# Patient Record
Sex: Female | Born: 1980 | Race: Black or African American | Hispanic: No | Marital: Single | Smoking: Former smoker
Health system: Southern US, Community
[De-identification: ages and names within clinical notes are randomized; demographics above are authoritative.]

## PROBLEM LIST (undated history)

## (undated) DIAGNOSIS — M329 Systemic lupus erythematosus, unspecified: Secondary | ICD-10-CM

## (undated) DIAGNOSIS — R87629 Unspecified abnormal cytological findings in specimens from vagina: Secondary | ICD-10-CM

## (undated) DIAGNOSIS — I10 Essential (primary) hypertension: Secondary | ICD-10-CM

## (undated) DIAGNOSIS — J45909 Unspecified asthma, uncomplicated: Secondary | ICD-10-CM

## (undated) DIAGNOSIS — D573 Sickle-cell trait: Secondary | ICD-10-CM

## (undated) HISTORY — DX: Unspecified abnormal cytological findings in specimens from vagina: R87.629

## (undated) HISTORY — PX: CHOLECYSTECTOMY: SHX55

## (undated) HISTORY — DX: Systemic lupus erythematosus, unspecified: M32.9

---

## 2012-04-05 ENCOUNTER — Encounter (HOSPITAL_COMMUNITY): Payer: Self-pay | Admitting: Emergency Medicine

## 2012-04-05 ENCOUNTER — Emergency Department (HOSPITAL_COMMUNITY)
Admission: EM | Admit: 2012-04-05 | Discharge: 2012-04-05 | Disposition: A | Discharge status: Home / Self Care | Payer: Self-pay | Attending: Emergency Medicine | Admitting: Emergency Medicine

## 2012-04-05 ENCOUNTER — Emergency Department (HOSPITAL_COMMUNITY): Payer: Self-pay

## 2012-04-05 DIAGNOSIS — Z3202 Encounter for pregnancy test, result negative: Secondary | ICD-10-CM | POA: Insufficient documentation

## 2012-04-05 DIAGNOSIS — M25559 Pain in unspecified hip: Secondary | ICD-10-CM | POA: Insufficient documentation

## 2012-04-05 DIAGNOSIS — M25552 Pain in left hip: Secondary | ICD-10-CM

## 2012-04-05 DIAGNOSIS — J45909 Unspecified asthma, uncomplicated: Secondary | ICD-10-CM | POA: Insufficient documentation

## 2012-04-05 DIAGNOSIS — R109 Unspecified abdominal pain: Secondary | ICD-10-CM | POA: Insufficient documentation

## 2012-04-05 HISTORY — DX: Unspecified asthma, uncomplicated: J45.909

## 2012-04-05 LAB — URINALYSIS, ROUTINE W REFLEX MICROSCOPIC
Bilirubin Urine: NEGATIVE
Glucose, UA: NEGATIVE mg/dL
Hgb urine dipstick: NEGATIVE
Ketones, ur: NEGATIVE mg/dL
Protein, ur: NEGATIVE mg/dL
pH: 6 (ref 5.0–8.0)

## 2012-04-05 LAB — WET PREP, GENITAL
Clue Cells Wet Prep HPF POC: NONE SEEN
Trich, Wet Prep: NONE SEEN

## 2012-04-05 MED ORDER — OXYCODONE-ACETAMINOPHEN 5-325 MG PO TABS: 1.0000 | ORAL_TABLET | Freq: Four times a day (QID) | ORAL | Status: DC | PRN

## 2012-04-05 MED ORDER — ONDANSETRON 4 MG PO TBDP
4.0000 mg | ORAL_TABLET | Freq: Once | ORAL | Status: AC
  Administered 2012-04-05: 4 mg via ORAL
  Filled 2012-04-05: qty 1

## 2012-04-05 MED ORDER — HYDROMORPHONE HCL PF 2 MG/ML IJ SOLN
2.0000 mg | Freq: Once | INTRAMUSCULAR | Status: AC
  Administered 2012-04-05: 2 mg via INTRAMUSCULAR
  Filled 2012-04-05: qty 1

## 2012-04-05 NOTE — ED Notes (Signed)
Pt points to lower abdomen and says she has pain 9/10 for 2 days. Denies n/v/d and urinary freq or burning.

## 2012-04-05 NOTE — ED Provider Notes (Signed)
History     CSN: [id number]  Arrival date & time 04/05/12  1713   First MD Initiated Contact with Patient 04/05/12 2029      No chief complaint on file.   (Consider location/radiation/quality/duration/timing/severity/associated sxs/prior treatment) Patient is a 32 y.o. female presenting with hip pain. The history is provided by the patient.  Hip Pain This is a chronic problem. The current episode started 2 days ago. The problem occurs constantly. The problem has been rapidly worsening. Associated symptoms comments: Now having pain in her pelvis since today. No dysuria, vaginal discharge, fever. The symptoms are aggravated by walking. Nothing relieves the symptoms. Treatments tried: Flexeril, naproxen, hydrocodone. The treatment provided no relief.    Past Medical History  Diagnosis Date  . Asthma     Past Surgical History  Procedure Date  . Cholecystectomy     No family history on file.  History  Substance Use Topics  . Smoking status: Never Smoker   . Smokeless tobacco: Not on file  . Alcohol Use: No    OB History    Grav Para Term Preterm Abortions TAB SAB Ect Mult Living                  Review of Systems  Constitutional: Negative for fever.  Gastrointestinal: Negative for nausea and vomiting.  Genitourinary: Negative for dysuria, urgency, flank pain, decreased urine volume, vaginal bleeding and vaginal discharge.  Musculoskeletal: Positive for gait problem. Negative for joint swelling.  Skin: Negative for rash.  All other systems reviewed and are negative.    Allergies  Review of patient's allergies indicates no known allergies.  Home Medications   Current Outpatient Rx  Name  Route  Sig  Dispense  Refill  . CYCLOBENZAPRINE HCL 10 MG PO TABS   Oral   Take 10 mg by mouth 3 (three) times daily as needed. Muscle spasms         . NAPROXEN 250 MG PO TABS   Oral   Take 250 mg by mouth 2 (two) times daily with a meal.           BP 158/89  Pulse  107  Temp 99 F (37.2 C)  Resp 20  SpO2 99%  LMP 03/30/2012  Physical Exam  Nursing note and vitals reviewed. Constitutional: She is oriented to person, place, and time. She appears well-developed and well-nourished. No distress.  HENT:  Head: Normocephalic and atraumatic.  Mouth/Throat: Oropharynx is clear and moist.  Eyes: Conjunctivae normal and EOM are normal. Pupils are equal, round, and reactive to light.  Neck: Normal range of motion. Neck supple.  Cardiovascular: Regular rhythm and intact distal pulses.  Tachycardia present.   No murmur heard. Pulmonary/Chest: Effort normal and breath sounds normal. No respiratory distress. She has no wheezes. She has no rales.  Abdominal: Soft. She exhibits no distension. There is tenderness in the suprapubic area. There is no rebound and no guarding.  Genitourinary: Vagina normal and uterus normal. Cervix exhibits no motion tenderness and no discharge. Right adnexum displays no mass, no tenderness and no fullness. Left adnexum displays no mass, no tenderness and no fullness. No vaginal discharge found.  Musculoskeletal: She exhibits no edema and no tenderness.       Left hip: She exhibits decreased range of motion, tenderness and bony tenderness. She exhibits normal strength.  Neurological: She is alert and oriented to person, place, and time.  Skin: Skin is warm and dry. No rash noted. No erythema.  Psychiatric:  She has a normal mood and affect. Her behavior is normal.    ED Course  Procedures (including critical care time)  Labs Reviewed  WET PREP, GENITAL - Abnormal; Notable for the following:    WBC, Wet Prep HPF POC FEW (*)     All other components within normal limits  URINALYSIS, ROUTINE W REFLEX MICROSCOPIC  POCT PREGNANCY, URINE  GC/CHLAMYDIA PROBE AMP   Dg Hip Complete Left  04/05/2012  *RADIOLOGY REPORT*  Clinical Data: Left-sided pelvic and groin pain for 2 weeks.  No known injury.  LEFT HIP - COMPLETE 2+ VIEW  Comparison:  None.  Findings: The patient is rotated on the AP pelvic view.  Given the rotation, the pelvis appears to be symmetrical without displacement.  SI joints and symphysis pubis are not displaced. Visualized lower lumbar spine and right hip appear intact.  The left hip appears intact.  No evidence of acute fracture or subluxation.  No focal bone lesion or bone destruction.  Bone cortex and trabecular architecture appear intact.  IMPRESSION: No acute bony abnormalities.   Original Report Authenticated By: Burman Nieves, M.D.      No diagnosis found.    MDM   Patient with a history of chronic left hip pain which she states has gotten to the point where it is severely painful to walk on it but it has been going on for years. However now she is having pelvic pain. The symptoms are so severe it hurts to walk. She denies any dysuria or vaginal discharge. Patient does not have sex with men so the likelihood of an STD is very low. Her urine and UPT are normal. He has severe pain with range of motion of her left hip.  She does have sickle cell trait and in the past she was told that that is most likely the cause of her hip pain. She takes naproxen and posterolateral improvement but denies any injury. Pelvic exam to rule out. Pathology as the cause for her pain. Patient given IM pain control. Plain films of the left hip ordered to evaluate for avascular necrosis, arthritis or other issues that could be causing her pain. Low suspicion for septic hip.  10:34 PM Patient with a negative pelvic exam. Films of the left hip are within normal limits. However patient does claim to have chronic back pain as well. She had some improvement with the first shot of Dilaudid we'll get a second. UA is within normal limits. Feel that her hip pain is either from a lumbar radiculopathy versus a hip issue. Either way she needs to followup with orthopedics her PCP for MRI of the L-spine and hip. There is no indication for this imaging  tonight      Gwyneth Sprout, MD 04/05/12 2236

## 2012-04-05 NOTE — ED Notes (Signed)
Per EMS pt reports left hip pain x 2 weeks and now it is in her pelvis and she feels like "her pelvis is ripping down the center". Pain 9/10 and constant. BP-163/95, HR 80, 100% on room air.

## 2012-04-06 LAB — GC/CHLAMYDIA PROBE AMP
CT Probe RNA: NEGATIVE
GC Probe RNA: NEGATIVE

## 2012-09-24 ENCOUNTER — Encounter (HOSPITAL_COMMUNITY): Payer: Self-pay | Admitting: Emergency Medicine

## 2012-09-24 ENCOUNTER — Emergency Department (HOSPITAL_COMMUNITY)
Admission: EM | Admit: 2012-09-24 | Discharge: 2012-09-24 | Disposition: A | Discharge status: Home / Self Care | Payer: Self-pay | Attending: Emergency Medicine | Admitting: Emergency Medicine

## 2012-09-24 DIAGNOSIS — J45909 Unspecified asthma, uncomplicated: Secondary | ICD-10-CM | POA: Insufficient documentation

## 2012-09-24 DIAGNOSIS — R21 Rash and other nonspecific skin eruption: Secondary | ICD-10-CM | POA: Insufficient documentation

## 2012-09-24 DIAGNOSIS — Z79899 Other long term (current) drug therapy: Secondary | ICD-10-CM | POA: Insufficient documentation

## 2012-09-24 MED ORDER — TRIAMCINOLONE ACETONIDE 0.1 % EX CREA: TOPICAL_CREAM | Freq: Two times a day (BID) | CUTANEOUS | Status: DC

## 2012-09-24 NOTE — ED Notes (Signed)
Pt reports rash on bilateral upper extremities that began in May and has gotten worse. Pt denies any fever, new medications, or new foods. Pt denies rash on any other part of her body. Pt tried hydrocortisone cream but states "that made it worse."

## 2012-09-24 NOTE — ED Provider Notes (Signed)
Medical screening examination/treatment/procedure(s) were performed by non-physician practitioner and as supervising physician I was immediately available for consultation/collaboration.   Jurline Folger, MD 09/24/12 2304 

## 2012-09-24 NOTE — ED Provider Notes (Signed)
This chart was scribed for Natalie Chase, a non-physician practitioner working with Natalie Racer, MD by Natalie Chase, ED Scribe. This patient was seen in room WTR9/WTR9 and the patient's care was started at 1841.    CSN: [id number]     Arrival date & time 09/24/12  1815 History     First MD Initiated Contact with Patient 09/24/12 1830     Chief Complaint  Patient presents with  . Rash   (Consider location/radiation/quality/duration/timing/severity/associated sxs/prior Treatment) The history is provided by the patient.   HPI Comments: Natalie Chase is a 32 y.o. female who presents to the Emergency Department complaining of worsening mildly pruritic rash onset this May on right arm and a few weeks ago left arm. 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 Reports rash is pruritic and non-tender. Reports symptoms are aggravated with hydrocortisone and alleviated by nothing. Denies associated lesions in mouth, fever, and other areas of rash. Denies using new detergents, soaps, or lotions.  Past Medical History  Diagnosis Date  . Asthma    Past Surgical History  Procedure Laterality Date  . Cholecystectomy     No family history on file. History  Substance Use Topics  . Smoking status: Never Smoker   . Smokeless tobacco: Not on file  . Alcohol Use: No   OB History   Grav Para Term Preterm Abortions TAB SAB Ect Mult Living                 Review of Systems  Constitutional: Negative for fever.  Skin: Positive for rash.  All other systems reviewed and are negative.  A complete 10 system review of systems was obtained and all systems are negative except as noted in the HPI and PMH.      Allergies  Review of patient's allergies indicates no known allergies.  Home Medications   Current Outpatient Rx  Name  Route  Sig  Dispense  Refill  . cyclobenzaprine (FLEXERIL) 10 MG tablet   Oral   Take 10 mg by mouth 3 (three) times daily as needed. Muscle spasms          . naproxen (NAPROSYN) 250 MG tablet   Oral   Take 250 mg by mouth 2 (two) times daily with a meal.         . oxyCODONE-acetaminophen (PERCOCET/ROXICET) 5-325 MG per tablet   Oral   Take 1 tablet by mouth every 6 (six) hours as needed for pain.   20 tablet   0    BP 157/76  Pulse 84  Temp(Src) 98.9 F (37.2 C) (Oral)  Resp 18  Ht 5\' 5"  (1.651 m)  Wt 200 lb (90.719 kg)  BMI 33.28 kg/m2  SpO2 100% Physical Exam  Nursing note and vitals reviewed. Constitutional: She is oriented to person, place, and time. She appears well-developed and well-nourished. No distress.  HENT:  Head: Normocephalic and atraumatic.  Eyes: EOM are normal.  Neck: Neck supple. No tracheal deviation present.  Cardiovascular: Normal rate.   Pulmonary/Chest: Effort normal. No respiratory distress.  Musculoskeletal: Normal range of motion.  Neurological: She is alert and oriented to person, place, and time.  Skin: Skin is warm and dry. Rash noted. Rash is papular.  Scaly raised, erythematous patches with central papules to bilateral forearms and antecubital fossas.   Psychiatric: She has a normal mood and affect. Her behavior is normal.    ED Course   Procedures (including critical care time) Medications - No data to display  Labs Reviewed - No data to display No results found.  1. Rash     MDM  Rash consistent with possible contact dermatitis vs eczematous plaques. Will try kenalog cream. Vistaril for itching. Follow up with pcp as needed. No oral mucosal involvement. No fever. No other complaints.   Filed Vitals:   09/24/12 1825  BP: 157/76  Pulse: 84  Temp: 98.9 F (37.2 C)  Resp: 18   Filed Vitals:   09/24/12 1825  BP: 157/76  Pulse: 84  Temp: 98.9 F (37.2 C)  TempSrc: Oral  Resp: 18  Height: 5\' 5"  (1.651 m)  Weight: 200 lb (90.719 kg)  SpO2: 100%     Natalie Mussel, PA-C 09/24/12 1920

## 2012-11-16 ENCOUNTER — Emergency Department (HOSPITAL_COMMUNITY): Payer: BC Managed Care – PPO

## 2012-11-16 ENCOUNTER — Emergency Department (HOSPITAL_COMMUNITY)
Admission: EM | Admit: 2012-11-16 | Discharge: 2012-11-16 | Disposition: A | Discharge status: Home / Self Care | Payer: BC Managed Care – PPO | Attending: Emergency Medicine | Admitting: Emergency Medicine

## 2012-11-16 ENCOUNTER — Encounter (HOSPITAL_COMMUNITY): Payer: Self-pay | Admitting: Emergency Medicine

## 2012-11-16 DIAGNOSIS — I10 Essential (primary) hypertension: Secondary | ICD-10-CM | POA: Insufficient documentation

## 2012-11-16 DIAGNOSIS — R350 Frequency of micturition: Secondary | ICD-10-CM | POA: Insufficient documentation

## 2012-11-16 DIAGNOSIS — R109 Unspecified abdominal pain: Secondary | ICD-10-CM | POA: Insufficient documentation

## 2012-11-16 DIAGNOSIS — J3489 Other specified disorders of nose and nasal sinuses: Secondary | ICD-10-CM | POA: Insufficient documentation

## 2012-11-16 DIAGNOSIS — R059 Cough, unspecified: Secondary | ICD-10-CM | POA: Insufficient documentation

## 2012-11-16 DIAGNOSIS — R071 Chest pain on breathing: Secondary | ICD-10-CM | POA: Insufficient documentation

## 2012-11-16 DIAGNOSIS — Z3202 Encounter for pregnancy test, result negative: Secondary | ICD-10-CM | POA: Insufficient documentation

## 2012-11-16 DIAGNOSIS — R05 Cough: Secondary | ICD-10-CM | POA: Insufficient documentation

## 2012-11-16 DIAGNOSIS — R1110 Vomiting, unspecified: Secondary | ICD-10-CM | POA: Insufficient documentation

## 2012-11-16 DIAGNOSIS — J45901 Unspecified asthma with (acute) exacerbation: Secondary | ICD-10-CM

## 2012-11-16 DIAGNOSIS — Z79899 Other long term (current) drug therapy: Secondary | ICD-10-CM | POA: Insufficient documentation

## 2012-11-16 DIAGNOSIS — J069 Acute upper respiratory infection, unspecified: Secondary | ICD-10-CM | POA: Insufficient documentation

## 2012-11-16 DIAGNOSIS — R000 Tachycardia, unspecified: Secondary | ICD-10-CM | POA: Insufficient documentation

## 2012-11-16 HISTORY — DX: Essential (primary) hypertension: I10

## 2012-11-16 LAB — URINALYSIS, ROUTINE W REFLEX MICROSCOPIC
Bilirubin Urine: NEGATIVE
Nitrite: NEGATIVE
Specific Gravity, Urine: 1.02 (ref 1.005–1.030)
Urobilinogen, UA: 0.2 mg/dL (ref 0.0–1.0)

## 2012-11-16 LAB — BASIC METABOLIC PANEL
CO2: 24 mEq/L (ref 19–32)
Calcium: 8.6 mg/dL (ref 8.4–10.5)
Creatinine, Ser: 0.69 mg/dL (ref 0.50–1.10)
GFR calc Af Amer: >90 mL/min (ref >90)
GFR calc non Af Amer: >90 mL/min (ref >90)

## 2012-11-16 LAB — CBC
MCV: 74.1 fL — ABNORMAL LOW (ref 78.0–100.0)
Platelets: 329 10*3/uL (ref 150–400)
RDW: 16 % — ABNORMAL HIGH (ref 11.5–15.5)
WBC: 14.1 10*3/uL — ABNORMAL HIGH (ref 4.0–10.5)

## 2012-11-16 MED ORDER — ALBUTEROL SULFATE (5 MG/ML) 0.5% IN NEBU
2.5000 mg | INHALATION_SOLUTION | RESPIRATORY_TRACT | Status: AC
  Administered 2012-11-16 (×3): 2.5 mg via RESPIRATORY_TRACT
  Filled 2012-11-16 (×3): qty 0.5

## 2012-11-16 MED ORDER — POTASSIUM CHLORIDE CRYS ER 20 MEQ PO TBCR
40.0000 meq | EXTENDED_RELEASE_TABLET | Freq: Once | ORAL | Status: AC
  Administered 2012-11-16: 40 meq via ORAL
  Filled 2012-11-16: qty 2

## 2012-11-16 MED ORDER — SODIUM CHLORIDE 0.9 % IV BOLUS (SEPSIS)
1000.0000 mL | Freq: Once | INTRAVENOUS | Status: AC
  Administered 2012-11-16: 1000 mL via INTRAVENOUS

## 2012-11-16 MED ORDER — PREDNISONE 20 MG PO TABS
60.0000 mg | ORAL_TABLET | Freq: Once | ORAL | Status: AC
  Administered 2012-11-16: 60 mg via ORAL
  Filled 2012-11-16: qty 3

## 2012-11-16 MED ORDER — ALBUTEROL SULFATE HFA 108 (90 BASE) MCG/ACT IN AERS: 1.0000 | INHALATION_SPRAY | Freq: Four times a day (QID) | RESPIRATORY_TRACT | Status: DC | PRN

## 2012-11-16 MED ORDER — IPRATROPIUM BROMIDE 0.02 % IN SOLN
0.5000 mg | RESPIRATORY_TRACT | Status: AC
  Administered 2012-11-16 (×3): 0.5 mg via RESPIRATORY_TRACT
  Filled 2012-11-16 (×3): qty 2.5

## 2012-11-16 MED ORDER — PREDNISONE 20 MG PO TABS: ORAL_TABLET | ORAL | Status: DC

## 2012-11-16 NOTE — ED Notes (Signed)
Pt breathing with no difficulty on RA

## 2012-11-16 NOTE — ED Provider Notes (Signed)
CSN: [id number]     Arrival date & time 11/16/12  4098 History   First MD Initiated Contact with Patient 11/16/12 [phone]     Chief Complaint  Patient presents with  . Shortness of Breath   (Consider location/radiation/quality/duration/timing/severity/associated sxs/prior Treatment) HPI Comments: Pt also reports BL migratory flank pain, and urinary frequency w/o dysuria  Patient is a 32 y.o. female presenting with shortness of breath. The history is provided by the patient. No language interpreter was used.  Shortness of Breath Severity:  Moderate Onset quality:  Gradual Duration:  1 week Timing:  Constant Progression:  Worsening Chronicity:  New Context comment:  Spontaneously Relieved by:  Nothing Worsened by:  Nothing tried Ineffective treatments:  Inhaler Associated symptoms: chest pain, cough, sputum production, vomiting and wheezing   Associated symptoms: no abdominal pain (chest tightness, unchanged for several years), no diaphoresis, no fever, no headaches, no neck pain, no rash, no sore throat and no syncope   Chest pain:    Quality:  Aching   Severity:  Unable to specify   Onset quality:  Unable to specify   Duration: years.   Progression:  Unchanged   Chronicity:  Chronic Cough:    Cough characteristics:  Productive   Sputum characteristics:  Nondescript   Severity:  Moderate   Onset quality:  Gradual   Duration:  1 day   Timing:  Constant   Progression:  Worsening   Chronicity:  New Wheezing:    Severity:  Moderate   Onset quality:  Gradual   Duration:  1 week   Timing:  Constant   Progression:  Worsening   Chronicity:  New Risk factors: no oral contraceptive use, no prolonged immobilization, no recent surgery and no tobacco use   Risk factors comment:  Asthma   Past Medical History  Diagnosis Date  . Asthma   . Hypertension    Past Surgical History  Procedure Laterality Date  . Cholecystectomy     History reviewed. No pertinent family  history. History  Substance Use Topics  . Smoking status: Never Smoker   . Smokeless tobacco: Not on file  . Alcohol Use: No   OB History   Grav Para Term Preterm Abortions TAB SAB Ect Mult Living                 Review of Systems  Constitutional: Negative for fever, chills, diaphoresis, activity change, appetite change and fatigue.  HENT: Negative for congestion, sore throat, facial swelling, rhinorrhea, neck pain and neck stiffness.   Eyes: Negative for photophobia and discharge.  Respiratory: Positive for cough, sputum production, shortness of breath and wheezing. Negative for chest tightness.   Cardiovascular: Positive for chest pain. Negative for palpitations, leg swelling and syncope.  Gastrointestinal: Positive for vomiting. Negative for nausea, abdominal pain (chest tightness, unchanged for several years) and diarrhea.  Endocrine: Negative for polydipsia.  Genitourinary: Positive for frequency and flank pain. Negative for dysuria, difficulty urinating and pelvic pain.  Musculoskeletal: Negative for back pain and arthralgias.  Skin: Negative for color change, rash and wound.  Allergic/Immunologic: Negative for immunocompromised state.  Neurological: Negative for facial asymmetry, weakness, numbness and headaches.  Hematological: Does not bruise/bleed easily.  Psychiatric/Behavioral: Negative for confusion and agitation.    Allergies  Review of patient's allergies indicates no known allergies.  Home Medications   Current Outpatient Rx  Name  Route  Sig  Dispense  Refill  . albuterol (PROVENTIL HFA;VENTOLIN HFA) 108 (90 BASE) MCG/ACT inhaler   Inhalation  Inhale 1-2 puffs into the lungs every 6 (six) hours as needed for wheezing or shortness of breath.   1 Inhaler   1   . predniSONE (DELTASONE) 20 MG tablet      3 tabs po day one, then 2 tabs daily x 4 days   11 tablet   0    BP 152/68  Pulse 94  Temp(Src) 98.6 F (37 C) (Oral)  Resp 15  SpO2 98%  LMP  10/22/2012 Physical Exam  Constitutional: She is oriented to person, place, and time. She appears well-developed and well-nourished. No distress.  HENT:  Head: Normocephalic and atraumatic.  Mouth/Throat: No oropharyngeal exudate.  Eyes: Pupils are equal, round, and reactive to light.  Neck: Normal range of motion. Neck supple.  Cardiovascular: Regular rhythm and normal heart sounds.  Tachycardia present.  Exam reveals no gallop and no friction rub.   No murmur heard. Pulmonary/Chest: Effort normal. No respiratory distress. She has decreased breath sounds in the right upper field, the right middle field, the right lower field, the left upper field, the left middle field and the left lower field. She has wheezes in the right upper field, the right middle field, the right lower field, the left upper field, the left middle field and the left lower field. She has no rales.  Prolonged expiratory phase  Abdominal: Soft. Bowel sounds are normal. She exhibits no distension and no mass. There is no tenderness. There is no rebound and no guarding.  Musculoskeletal: Normal range of motion. She exhibits no edema and no tenderness.  Neurological: She is alert and oriented to person, place, and time.  Skin: Skin is warm and dry.  Psychiatric: She has a normal mood and affect.    ED Course  Procedures (including critical care time) Labs Review Labs Reviewed  BASIC METABOLIC PANEL - Abnormal; Notable for the following:    Potassium 3.2 (*)    Glucose, Bld 110 (*)    All other components within normal limits  CBC - Abnormal; Notable for the following:    WBC 14.1 (*)    MCV 74.1 (*)    MCH 24.7 (*)    RDW 16.0 (*)    All other components within normal limits  URINALYSIS, ROUTINE W REFLEX MICROSCOPIC  POCT PREGNANCY, URINE   Imaging Review Dg Chest 2 View (if Patient Has Fever And/or Copd)  11/16/2012   CLINICAL DATA:  Cough and short of breath  EXAM: CHEST  2 VIEW  COMPARISON:  None.  FINDINGS:  Normal heart size. Clear lungs. No pneumothorax. No pleural fluid. No acute bony deformity.  IMPRESSION: No active cardiopulmonary disease.   Electronically Signed   By: Maryclare Bean M.D.   On: 11/16/2012 08:17     Date: 11/16/2012  Rate: 98  Rhythm: normal sinus rhythm  QRS Axis: normal  Intervals: borderline 1st degree AV block (PR 196)  ST/T Wave abnormalities: normal  Conduction Disutrbances: none  Narrative Interpretation: unremarkable, none for comparison.       MDM   1. Viral URI   2. Asthma exacerbation    Pt is a 32 y.o. female with Pmhx as above including asthma who presents with about 1 week of cough, SOB, wheezing and inc albuterol use w/o known fever, leg pain or swelling. Also reports about 1.5 weeks of intermittent BL flank pain & urinary frequency w/o dysuria, hematuria, vag d/c or bleeding.  On PE, pt tachycardic, O2 stats nml, in NAD, but has prolonged expiratory phase,  dec air mvmt and wheezing throughout on lung exam.  W/U includes CXR, CBC, BMP, UA, EKG.  My found ot have mild leukocytosis, mild hypokalemia (replaced).  No CXR findings.  Urine not infected & no blood suggestive of stone.  Pulm exam much improved after 3 duoneb treatments & prednisone.  I believ ept has viral URI exacerbating her asthma.  Will d/c home w/ albuterol & prednisone.  Return precautions given for new or worsening symptoms including worsening SOB, cough, fever.  She will work on establishing care w/ local PCP.   1. Viral URI   2. Asthma exacerbation         Shanna Cisco, MD 11/16/12 1105

## 2012-11-16 NOTE — ED Notes (Signed)
Pt also reports flank pain since Monday with dizziness. Pt pt on 4L [place], pt reports improvement in sob, RR 19. Pt's coughing spell has stopped.

## 2012-11-16 NOTE — ED Notes (Signed)
PT reports sob and cough since Sunday. Hx asthma. PT sob, RR 30 100% on RA. Pt reports taking albuterol inhaler 5x today with no improvements in symptoms. Expiratory wheezing noted.

## 2013-07-01 ENCOUNTER — Emergency Department (HOSPITAL_COMMUNITY)
Admission: EM | Admit: 2013-07-01 | Discharge: 2013-07-01 | Disposition: A | Discharge status: Home / Self Care | Payer: BC Managed Care – PPO | Attending: Emergency Medicine | Admitting: Emergency Medicine

## 2013-07-01 ENCOUNTER — Emergency Department (HOSPITAL_COMMUNITY): Payer: BC Managed Care – PPO

## 2013-07-01 DIAGNOSIS — Z79899 Other long term (current) drug therapy: Secondary | ICD-10-CM | POA: Insufficient documentation

## 2013-07-01 DIAGNOSIS — J45909 Unspecified asthma, uncomplicated: Secondary | ICD-10-CM | POA: Insufficient documentation

## 2013-07-01 DIAGNOSIS — R079 Chest pain, unspecified: Secondary | ICD-10-CM | POA: Insufficient documentation

## 2013-07-01 DIAGNOSIS — I10 Essential (primary) hypertension: Secondary | ICD-10-CM | POA: Insufficient documentation

## 2013-07-01 DIAGNOSIS — J069 Acute upper respiratory infection, unspecified: Secondary | ICD-10-CM | POA: Insufficient documentation

## 2013-07-01 LAB — RAPID STREP SCREEN (MED CTR MEBANE ONLY): Streptococcus, Group A Screen (Direct): NEGATIVE

## 2013-07-01 MED ORDER — HYDROCODONE-HOMATROPINE 5-1.5 MG/5ML PO SYRP: 5.0000 mL | ORAL_SOLUTION | Freq: Four times a day (QID) | ORAL | Status: DC | PRN

## 2013-07-01 MED ORDER — HYDROCODONE-HOMATROPINE 5-1.5 MG/5ML PO SYRP
5.0000 mL | ORAL_SOLUTION | Freq: Once | ORAL | Status: AC
  Administered 2013-07-01: 5 mL via ORAL
  Filled 2013-07-01: qty 5

## 2013-07-01 MED ORDER — LORATADINE 10 MG PO TABS: 10.0000 mg | ORAL_TABLET | Freq: Every day | ORAL | Status: DC

## 2013-07-01 NOTE — Progress Notes (Signed)
  CARE MANAGEMENT ED NOTE 07/01/2013  Patient:  Natalie Chase,Natalie Chase   Account Number:  [id number]  Date Initiated:  07/01/2013  Documentation initiated by:  Radford PaxFERRERO,Fortune Brannigan  Subjective/Objective Assessment:   Patient presents to Ed with sore thraot for several days     Subjective/Objective Assessment Detail:     Action/Plan:   Action/Plan Detail:   Anticipated DC Date:       Status Recommendation to Physician:   Result of Recommendation:    Other ED Services  Consult Working Plan    DC Planning Services  Other  PCP issues    Choice offered to / List presented to:            Status of service:  Completed, signed off  ED Comments:   ED Comments Detail:  Patient confirms [facility]se does  not have a pcp as she has just moved here.  EDCM instructed patient to call the phone number on the back of her insurance card or go to insurance company website to help her find a physician who is close to her and within network.  Patient verbalized understanding.  No further EDCM needs at this time.

## 2013-07-01 NOTE — ED Notes (Signed)
Pt has a ride home.  
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
## 2013-07-01 NOTE — Discharge Instructions (Signed)
Cough, Adult ° A cough is a reflex that helps clear your throat and airways. It can help heal the body or may be a reaction to an irritated airway. A cough may only last 2 or 3 weeks (acute) or may last more than 8 weeks (chronic).  °CAUSES °Acute cough: °· Viral or bacterial infections. °Chronic cough: °· Infections. °· Allergies. °· Asthma. °· Post-nasal drip. °· Smoking. °· Heartburn or acid reflux. °· Some medicines. °· Chronic lung problems (COPD). °· Cancer. °SYMPTOMS  °· Cough. °· Fever. °· Chest pain. °· Increased breathing rate. °· High-pitched whistling sound when breathing (wheezing). °· Colored mucus that you cough up (sputum). °TREATMENT  °· A bacterial cough may be treated with antibiotic medicine. °· A viral cough must run its course and will not respond to antibiotics. °· Your caregiver may recommend other treatments if you have a chronic cough. °HOME CARE INSTRUCTIONS  °· Only take over-the-counter or prescription medicines for pain, discomfort, or fever as directed by your caregiver. Use cough suppressants only as directed by your caregiver. °· Use a cold steam vaporizer or humidifier in your bedroom or home to help loosen secretions. °· Sleep in a semi-upright position if your cough is worse at night. °· Rest as needed. °· Stop smoking if you smoke. °SEEK IMMEDIATE MEDICAL CARE IF:  °· You have pus in your sputum. °· Your cough starts to worsen. °· You cannot control your cough with suppressants and are losing sleep. °· You begin coughing up blood. °· You have difficulty breathing. °· You develop pain which is getting worse or is uncontrolled with medicine. °· You have a fever. °MAKE SURE YOU:  °· Understand these instructions. °· Will watch your condition. °· Will get help right away if you are not doing well or get worse. °Document Released: 08/12/2010 Document Revised: 05/08/2011 Document Reviewed: 08/12/2010 °[employer]® Patient Information ©2014 [employer]. °Viral Infections °A virus is a  type of germ. Viruses can cause: °· Minor sore throats. °· Aches and pains. °· Headaches. °· Runny nose. °· Rashes. °· Watery eyes. °· Tiredness. °· Coughs. °· Loss of appetite. °· Feeling sick to your stomach (nausea). °· Throwing up (vomiting). °· Watery poop (diarrhea). °HOME CARE  °· Only take medicines as told by your doctor. °· Drink enough water and fluids to keep your pee (urine) clear or pale yellow. Sports drinks are a good choice. °· Get plenty of rest and eat healthy. Soups and broths with crackers or rice are fine. °GET HELP RIGHT AWAY IF:  °· You have a very bad headache. °· You have shortness of breath. °· You have chest pain or neck pain. °· You have an unusual rash. °· You cannot stop throwing up. °· You have watery poop that does not stop. °· You cannot keep fluids down. °· You or your child has a temperature by mouth above 102° F (38.9° C), not controlled by medicine. °· Your baby is older than 3 months with a rectal temperature of 102° F (38.9° C) or higher. °· Your baby is 3 months old or younger with a rectal temperature of 100.4° F (38° C) or higher. °MAKE SURE YOU:  °· Understand these instructions. °· Will watch this condition. °· Will get help right away if you are not doing well or get worse. °Document Released: 01/27/2008 Document Revised: 05/08/2011 Document Reviewed: 06/21/2010 °ExitCare® Patient Information ©2014 [employer]. ° °

## 2013-07-01 NOTE — ED Notes (Signed)
Pt sts that she has had a sore throat for several days. No resp. Distress noted. No drainage noted.

## 2013-07-02 LAB — RAPID HIV SCREEN (WH-MAU): Rapid HIV Screen: NONREACTIVE

## 2013-07-03 LAB — CULTURE, GROUP A STREP

## 2013-07-06 NOTE — ED Provider Notes (Signed)
Medical screening examination/treatment/procedure(s) were performed by non-physician practitioner and as supervising physician I was immediately available for consultation/collaboration.   EKG Interpretation None     ]   Demiana Crumbley L Clarke Peretz, MD 07/06/13 2130 

## 2014-06-21 ENCOUNTER — Emergency Department (HOSPITAL_COMMUNITY): Payer: 59

## 2014-06-21 ENCOUNTER — Encounter (HOSPITAL_COMMUNITY): Payer: Self-pay | Admitting: Emergency Medicine

## 2014-06-21 ENCOUNTER — Emergency Department (HOSPITAL_COMMUNITY)
Admission: EM | Admit: 2014-06-21 | Discharge: 2014-06-21 | Disposition: A | Discharge status: Home / Self Care | Payer: 59 | Attending: Emergency Medicine | Admitting: Emergency Medicine

## 2014-06-21 DIAGNOSIS — Y9389 Activity, other specified: Secondary | ICD-10-CM | POA: Insufficient documentation

## 2014-06-21 DIAGNOSIS — Z862 Personal history of diseases of the blood and blood-forming organs and certain disorders involving the immune mechanism: Secondary | ICD-10-CM | POA: Insufficient documentation

## 2014-06-21 DIAGNOSIS — Z720 Tobacco use: Secondary | ICD-10-CM | POA: Insufficient documentation

## 2014-06-21 DIAGNOSIS — Y9289 Other specified places as the place of occurrence of the external cause: Secondary | ICD-10-CM | POA: Diagnosis not present

## 2014-06-21 DIAGNOSIS — Z79899 Other long term (current) drug therapy: Secondary | ICD-10-CM | POA: Diagnosis not present

## 2014-06-21 DIAGNOSIS — J45909 Unspecified asthma, uncomplicated: Secondary | ICD-10-CM | POA: Diagnosis not present

## 2014-06-21 DIAGNOSIS — W458XXA Other foreign body or object entering through skin, initial encounter: Secondary | ICD-10-CM | POA: Diagnosis not present

## 2014-06-21 DIAGNOSIS — I10 Essential (primary) hypertension: Secondary | ICD-10-CM | POA: Diagnosis not present

## 2014-06-21 DIAGNOSIS — Y998 Other external cause status: Secondary | ICD-10-CM | POA: Diagnosis not present

## 2014-06-21 DIAGNOSIS — S61230A Puncture wound without foreign body of right index finger without damage to nail, initial encounter: Secondary | ICD-10-CM

## 2014-06-21 HISTORY — DX: Sickle-cell trait: D57.3

## 2014-06-21 MED ORDER — TETANUS-DIPHTH-ACELL PERTUSSIS 5-2.5-18.5 LF-MCG/0.5 IM SUSP
0.5000 mL | Freq: Once | INTRAMUSCULAR | Status: AC
  Administered 2014-06-21: 0.5 mL via INTRAMUSCULAR
  Filled 2014-06-21: qty 0.5

## 2014-06-21 MED ORDER — SULFAMETHOXAZOLE-TRIMETHOPRIM 800-160 MG PO TABS: 1.0000 | ORAL_TABLET | Freq: Two times a day (BID) | ORAL | Status: AC

## 2014-06-21 MED ORDER — IBUPROFEN 800 MG PO TABS
800.0000 mg | ORAL_TABLET | Freq: Once | ORAL | Status: AC
  Administered 2014-06-21: 800 mg via ORAL
  Filled 2014-06-21: qty 1

## 2014-06-21 MED ORDER — TETANUS-DIPHTH-ACELL PERTUSSIS 5-2.5-18.5 LF-MCG/0.5 IM SUSP: 0.5000 mL | Freq: Once | INTRAMUSCULAR | Status: DC

## 2014-06-21 NOTE — ED Provider Notes (Signed)
CSN: [id number]     Arrival date & time 06/21/14  1034 History   First MD Initiated Contact with Patient 06/21/14 1047     Chief Complaint  Patient presents with  . Foreign Body in Skin    splinter index finger r/hand     (Consider location/radiation/quality/duration/timing/severity/associated sxs/prior Treatment) Patient is a 34 y.o. female presenting with hand pain. The history is provided by the patient. No language interpreter was used.  Hand Pain This is a new problem. The current episode started yesterday. The problem occurs constantly. The problem has been gradually worsening. Associated symptoms include joint swelling. Pertinent negatives include no myalgias or numbness. Nothing aggravates the symptoms. She has tried nothing for the symptoms. The treatment provided moderate relief.    Past Medical History  Diagnosis Date  . Asthma   . Hypertension   . Sickle cell trait    Past Surgical History  Procedure Laterality Date  . Cholecystectomy     Family History  Problem Relation Age of Onset  . Achondroplasia Mother   . Lupus Mother   . Sickle cell anemia Mother   . Cancer Other    History  Substance Use Topics  . Smoking status: Current Every Day Smoker    Types: Cigarettes  . Smokeless tobacco: Not on file  . Alcohol Use: Yes   OB History    No data available     Review of Systems  Musculoskeletal: Positive for joint swelling. Negative for myalgias.  Neurological: Negative for numbness.  All other systems reviewed and are negative.     Allergies  Review of patient's allergies indicates no known allergies.  Home Medications   Prior to Admission medications   Medication Sig Start Date End Date Taking? Authorizing Provider  albuterol (PROVENTIL HFA;VENTOLIN HFA) 108 (90 BASE) MCG/ACT inhaler Inhale 1-2 puffs into the lungs every 6 (six) hours as needed for wheezing or shortness of breath. Patient not taking: Reported on 06/21/2014 11/16/12   Toy Cookey, MD  HYDROcodone-homatropine [facility]) 5-1.5 MG/5ML syrup Take 5 mLs by mouth every 6 (six) hours as needed for cough. Patient not taking: Reported on 06/21/2014 07/01/13   Junious Silk, PA-C  loratadine (CLARITIN) 10 MG tablet Take 1 tablet (10 mg total) by mouth daily. One po daily x 5 days Patient not taking: Reported on 06/21/2014 07/01/13   Junious Silk, PA-C  predniSONE (DELTASONE) 20 MG tablet 3 tabs po day one, then 2 tabs daily x 4 days Patient not taking: Reported on 06/21/2014 11/16/12   Toy Cookey, MD   BP 170/92 mmHg  Pulse 95  Temp(Src) 97.5 F (36.4 C) (Oral)  Resp 17  SpO2 98%  LMP 05/19/2014 Physical Exam  Constitutional: She is oriented to person, place, and time. She appears well-developed and well-nourished.  Musculoskeletal: She exhibits tenderness.  Swollen right index finger at pip.  Pain with bending,   I do not visualize foreign body or puncture.  nv and ns intact  Neurological: She is alert and oriented to person, place, and time. She has normal reflexes.  Skin: There is erythema.  Psychiatric: She has a normal mood and affect.  Nursing note and vitals reviewed.   ED Course  Procedures (including critical care time) Labs Review Labs Reviewed - No data to display  Imaging Review Dg Finger Index Right  06/21/2014   CLINICAL DATA:  Right index finger pain from a splinter at the PIP joint and incurred last night.  EXAM: RIGHT INDEX FINGER 2+V  COMPARISON:  None.  FINDINGS: There is no evidence of fracture or dislocation. There is no evidence of arthropathy or other focal bone abnormality. Soft tissues are unremarkable.  IMPRESSION: Normal examination.  No fracture or radiopaque foreign body seen.   Electronically Signed   By: Beckie SaltsSteven  Reid M.D.   On: 06/21/2014 12:02     EKG Interpretation None      MDM   Final diagnoses:  Puncture wound of right index finger without foreign body without damage to nail, initial encounter    Schedule to see  Dr. Amanda PeaGramig Bactrim tetanus    Elson AreasLeslie K Sofia, PA-C 06/21/14 1224  Cathren LaineKevin Steinl, MD 06/21/14 [phone]

## 2014-06-21 NOTE — Discharge Instructions (Signed)
Puncture Wound °A puncture wound is an injury that extends through all layers of the skin and into the tissue beneath the skin (subcutaneous tissue). Puncture wounds become infected easily because germs often enter the body and go beneath the skin during the injury. Having a deep wound with a small entrance point makes it difficult for your caregiver to adequately clean the wound. This is especially true if you have stepped on a nail and it has passed through a dirty shoe or other situations where the wound is obviously contaminated. °CAUSES  °Many puncture wounds involve glass, nails, splinters, fish hooks, or other objects that enter the skin (foreign bodies). A puncture wound may also be caused by a human bite or animal bite. °DIAGNOSIS  °A puncture wound is usually diagnosed by your history and a physical exam. You may need to have an X-ray or an ultrasound to check for any foreign bodies still in the wound. °TREATMENT  °· Your caregiver will clean the wound as thoroughly as possible. Depending on the location of the wound, a bandage (dressing) may be applied. °· Your caregiver might prescribe antibiotic medicines. °· You may need a follow-up visit to check on your wound. Follow all instructions as directed by your caregiver. °HOME CARE INSTRUCTIONS  °· Change your dressing once per day, or as directed by your caregiver. If the dressing sticks, it may be removed by soaking the area in water. °· If your caregiver has given you follow-up instructions, it is very important that you return for a follow-up appointment. Not following up as directed could result in a chronic or permanent injury, pain, and disability. °· Only take over-the-counter or prescription medicines for pain, discomfort, or fever as directed by your caregiver. °· If you are given antibiotics, take them as directed. Finish them even if you start to feel better. °You may need a tetanus shot if: °· You cannot remember when you had your last tetanus  shot. °· You have never had a tetanus shot. °If you got a tetanus shot, your arm may swell, get red, and feel warm to the touch. This is common and not a problem. If you need a tetanus shot and you choose not to have one, there is a rare chance of getting tetanus. Sickness from tetanus can be serious. °You may need a rabies shot if an animal bite caused your puncture wound. °SEEK MEDICAL CARE IF:  °· You have redness, swelling, or increasing pain in the wound. °· You have red streaks going away from the wound. °· You notice a bad smell coming from the wound or dressing. °· You have yellowish-white fluid (pus) coming from the wound. °· You are treated with an antibiotic for infection, but the infection is not getting better. °· You notice something in the wound, such as rubber from your shoe, cloth, or another object. °· You have a fever. °· You have severe pain. °· You have difficulty breathing. °· You feel dizzy or faint. °· You cannot stop vomiting. °· You lose feeling, develop numbness, or cannot move a limb below the wound. °· Your symptoms worsen. °MAKE SURE YOU: °· Understand these instructions. °· Will watch your condition. °· Will get help right away if you are not doing well or get worse. °Document Released: 11/23/2004 Document Revised: 05/08/2011 Document Reviewed: 08/02/2010 °Exit[employer]® Patient Information ©2015 [employer]. This information is not intended to replace advice given to you by your health care provider. Make sure you discuss any questions you   have with your health care provider. ° °
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
## 2014-09-07 ENCOUNTER — Other Ambulatory Visit: Payer: Self-pay | Admitting: Primary Care

## 2014-09-07 DIAGNOSIS — Z8489 Family history of other specified conditions: Secondary | ICD-10-CM

## 2014-09-10 ENCOUNTER — Other Ambulatory Visit: Payer: 59

## 2014-10-09 ENCOUNTER — Encounter: Payer: 59 | Admitting: Family Medicine

## 2014-10-14 ENCOUNTER — Other Ambulatory Visit: Payer: Self-pay

## 2014-10-14 ENCOUNTER — Emergency Department
Admission: EM | Admit: 2014-10-14 | Discharge: 2014-10-14 | Disposition: A | Discharge status: Home / Self Care | Payer: 59 | Attending: Emergency Medicine | Admitting: Emergency Medicine

## 2014-10-14 ENCOUNTER — Encounter: Payer: Self-pay | Admitting: Emergency Medicine

## 2014-10-14 DIAGNOSIS — I10 Essential (primary) hypertension: Secondary | ICD-10-CM | POA: Diagnosis not present

## 2014-10-14 DIAGNOSIS — R51 Headache: Secondary | ICD-10-CM | POA: Diagnosis not present

## 2014-10-14 DIAGNOSIS — R079 Chest pain, unspecified: Secondary | ICD-10-CM | POA: Diagnosis not present

## 2014-10-14 DIAGNOSIS — Z720 Tobacco use: Secondary | ICD-10-CM | POA: Insufficient documentation

## 2014-10-14 DIAGNOSIS — R202 Paresthesia of skin: Secondary | ICD-10-CM | POA: Insufficient documentation

## 2014-10-14 DIAGNOSIS — Z3202 Encounter for pregnancy test, result negative: Secondary | ICD-10-CM | POA: Insufficient documentation

## 2014-10-14 DIAGNOSIS — R519 Headache, unspecified: Secondary | ICD-10-CM

## 2014-10-14 LAB — BASIC METABOLIC PANEL
Anion gap: 7 (ref 5–15)
BUN: 10 mg/dL (ref 6–20)
CO2: 24 mmol/L (ref 22–32)
Calcium: 8.5 mg/dL — ABNORMAL LOW (ref 8.9–10.3)
Chloride: 101 mmol/L (ref 101–111)
Creatinine, Ser: 0.76 mg/dL (ref 0.44–1.00)
GFR calc Af Amer: >60 mL/min (ref >60)
Glucose, Bld: 119 mg/dL — ABNORMAL HIGH (ref 65–99)
POTASSIUM: 3.5 mmol/L (ref 3.5–5.1)
Sodium: 132 mmol/L — ABNORMAL LOW (ref 135–145)

## 2014-10-14 LAB — CBC
HCT: 39.1 % (ref 35.0–47.0)
Hemoglobin: 12.6 g/dL (ref 12.0–16.0)
MCH: 24.6 pg — ABNORMAL LOW (ref 26.0–34.0)
MCHC: 32.2 g/dL (ref 32.0–36.0)
MCV: 76.3 fL — ABNORMAL LOW (ref 80.0–100.0)
Platelets: 309 10*3/uL (ref 150–440)
RBC: 5.13 MIL/uL (ref 3.80–5.20)
RDW: 17 % — ABNORMAL HIGH (ref 11.5–14.5)
WBC: 11.2 10*3/uL — ABNORMAL HIGH (ref 3.6–11.0)

## 2014-10-14 LAB — PREGNANCY, URINE: PREG TEST UR: NEGATIVE

## 2014-10-14 LAB — TROPONIN I: Troponin I: <0.03 ng/mL (ref <0.031)

## 2014-10-14 MED ORDER — DIAZEPAM 5 MG PO TABS: 5.0000 mg | ORAL_TABLET | Freq: Three times a day (TID) | ORAL | Status: DC | PRN

## 2014-10-14 MED ORDER — METOCLOPRAMIDE HCL 5 MG/ML IJ SOLN
10.0000 mg | Freq: Once | INTRAMUSCULAR | Status: AC
  Administered 2014-10-14: 10 mg via INTRAVENOUS
  Filled 2014-10-14: qty 2

## 2014-10-14 MED ORDER — SODIUM CHLORIDE 0.9 % IV SOLN
Freq: Once | INTRAVENOUS | Status: AC
  Administered 2014-10-14: 10:00:00 via INTRAVENOUS

## 2014-10-14 MED ORDER — KETOROLAC TROMETHAMINE 30 MG/ML IJ SOLN
30.0000 mg | Freq: Once | INTRAMUSCULAR | Status: AC
Start: 2014-10-14 — End: 2014-10-14
  Administered 2014-10-14: 30 mg via INTRAVENOUS
  Filled 2014-10-14: qty 1

## 2014-10-14 MED ORDER — IBUPROFEN 800 MG PO TABS: 800.0000 mg | ORAL_TABLET | Freq: Three times a day (TID) | ORAL | Status: DC | PRN

## 2014-10-14 NOTE — ED Notes (Signed)
Patient to ED with report of headache that started last pm and then chest pain that started this morning. Patient brought over from [facility] due to chest pain and elevated BP.

## 2014-10-14 NOTE — Discharge Instructions (Signed)
Chest Pain (Nonspecific) °It is often hard to give a specific diagnosis for the cause of chest pain. There is always a chance that your pain could be related to something serious, such as a heart attack or a blood clot in the lungs. You need to follow up with your health care provider for further evaluation. °CAUSES  °· Heartburn. °· Pneumonia or bronchitis. °· Anxiety or stress. °· Inflammation around your heart (pericarditis) or lung (pleuritis or pleurisy). °· A blood clot in the lung. °· A collapsed lung (pneumothorax). It can develop suddenly on its own (spontaneous pneumothorax) or from trauma to the chest. °· Shingles infection (herpes zoster virus). °The chest wall is composed of bones, muscles, and cartilage. Any of these can be the source of the pain. °· The bones can be bruised by injury. °· The muscles or cartilage can be strained by coughing or overwork. °· The cartilage can be affected by inflammation and become sore (costochondritis). °DIAGNOSIS  °Lab tests or other studies may be needed to find the cause of your pain. Your health care provider may have you take a test called an ambulatory electrocardiogram (ECG). An ECG records your heartbeat patterns over a 24-hour period. You may also have other tests, such as: °· Transthoracic echocardiogram (TTE). During echocardiography, sound waves are used to evaluate how blood flows through your heart. °· Transesophageal echocardiogram (TEE). °· Cardiac monitoring. This allows your health care provider to monitor your heart rate and rhythm in real time. °· Holter monitor. This is a portable device that records your heartbeat and can help diagnose heart arrhythmias. It allows your health care provider to track your heart activity for several days, if needed. °· Stress tests by exercise or by giving medicine that makes the heart beat faster. °TREATMENT  °· Treatment depends on what may be causing your chest pain. Treatment may include: °¨ Acid blockers for  heartburn. °¨ Anti-inflammatory medicine. °¨ Pain medicine for inflammatory conditions. °¨ Antibiotics if an infection is present. °· You may be advised to change lifestyle habits. This includes stopping smoking and avoiding alcohol, caffeine, and chocolate. °· You may be advised to keep your head raised (elevated) when sleeping. This reduces the chance of acid going backward from your stomach into your esophagus. °Most of the time, nonspecific chest pain will improve within 2-3 days with rest and mild pain medicine.  °HOME CARE INSTRUCTIONS  °· If antibiotics were prescribed, take them as directed. Finish them even if you start to feel better. °· For the next few days, avoid physical activities that bring on chest pain. Continue physical activities as directed. °· Do not use any tobacco products, including cigarettes, chewing tobacco, or electronic cigarettes. °· Avoid drinking alcohol. °· Only take medicine as directed by your health care provider. °· Follow your health care provider's suggestions for further testing if your chest pain does not go away. °· Keep any follow-up appointments you made. If you do not go to an appointment, you could develop lasting (chronic) problems with pain. If there is any problem keeping an appointment, call to reschedule. °SEEK MEDICAL CARE IF:  °· Your chest pain does not go away, even after treatment. °· You have a rash with blisters on your chest. °· You have a fever. °SEEK IMMEDIATE MEDICAL CARE IF:  °· You have increased chest pain or pain that spreads to your arm, neck, jaw, back, or abdomen. °· You have shortness of breath. °· You have an increasing cough, or you cough   up blood. °· You have severe back or abdominal pain. °· You feel nauseous or vomit. °· You have severe weakness. °· You faint. °· You have chills. °This is an emergency. Do not wait to see if the pain will go away. Get medical help at once. Call your local emergency services (911 in [place] Do not drive  yourself to the hospital. °MAKE SURE YOU:  °· Understand these instructions. °· Will watch your condition. °· Will get help right away if you are not doing well or get worse. °Document Released: 11/23/2004 Document Revised: 02/18/2013 Document Reviewed: 09/19/2007 °ExitCare® Patient Information ©2015 [employer]. This information is not intended to replace advice given to you by your health care provider. Make sure you discuss any questions you have with your health care provider. °General Headache Without Cause °A headache is pain or discomfort felt around the head or neck area. The specific cause of a headache may not be found. There are many causes and types of headaches. A few common ones are: °· Tension headaches. °· Migraine headaches. °· Cluster headaches. °· Chronic daily headaches. °HOME CARE INSTRUCTIONS  °· Keep all follow-up appointments with your caregiver or any specialist referral. °· Only take over-the-counter or prescription medicines for pain or discomfort as directed by your caregiver. °· Lie down in a dark, quiet room when you have a headache. °· Keep a headache journal to find out what may trigger your migraine headaches. For example, write down: °¨ What you eat and drink. °¨ How much sleep you get. °¨ Any change to your diet or medicines. °· Try massage or other relaxation techniques. °· Put ice packs or heat on the head and neck. Use these 3 to 4 times per day for 15 to 20 minutes each time, or as needed. °· Limit stress. °· Sit up straight, and do not tense your muscles. °· Quit smoking if you smoke. °· Limit alcohol use. °· Decrease the amount of caffeine you drink, or stop drinking caffeine. °· Eat and sleep on a regular schedule. °· Get 7 to 9 hours of sleep, or as recommended by your caregiver. °· Keep lights dim if bright lights bother you and make your headaches worse. °SEEK MEDICAL CARE IF:  °· You have problems with the medicines you were prescribed. °· Your medicines are not  working. °· You have a change from the usual headache. °· You have nausea or vomiting. °SEEK IMMEDIATE MEDICAL CARE IF:  °· Your headache becomes severe. °· You have a fever. °· You have a stiff neck. °· You have loss of vision. °· You have muscular weakness or loss of muscle control. °· You start losing your balance or have trouble walking. °· You feel faint or pass out. °· You have severe symptoms that are different from your first symptoms. °MAKE SURE YOU:  °· Understand these instructions. °· Will watch your condition. °· Will get help right away if you are not doing well or get worse. °Document Released: 02/13/2005 Document Revised: 05/08/2011 Document Reviewed: 03/01/2011 °ExitCare® Patient Information ©2015 [employer]. This information is not intended to replace advice given to you by your health care provider. Make sure you discuss any questions you have with your health care provider. ° °

## 2014-10-14 NOTE — ED Notes (Signed)
Patient c/o chest pain located at the left breast non-radiating described as "sharp" since 0300. +Nausea,headache,and dizziness.  Denies any other symptoms.

## 2014-10-14 NOTE — ED Provider Notes (Signed)
[facility] Emergency Department Provider Note     Time seen: ----------------------------------------- 9:55 AM on 10/14/2014 -----------------------------------------    I have reviewed the triage vital signs and the nursing notes.   HISTORY  Chief Complaint Chest Pain and Headache    HPI Natalie Chase is a 34 y.o. female who presents ER with a complaint of headache that started last night. Patient also chest pain underneath her left breast that started this morning. She was brought from the clinic due to chest pain and elevated blood pressure. Patient states headache and chest pain or new problems. She denies any recent illness, nothing makes her symptoms better or worse. Headache is diffuse, chest pain is only located in the left breast   Past Medical History  Diagnosis Date  . Asthma   . Hypertension   . Sickle cell trait     There are no active problems to display for this patient.   Past Surgical History  Procedure Laterality Date  . Cholecystectomy      Allergies Review of patient's allergies indicates no known allergies.  Social History Social History  Substance Use Topics  . Smoking status: Current Every Day Smoker    Types: Cigarettes  . Smokeless tobacco: None  . Alcohol Use: Yes    Review of Systems Constitutional: Negative for fever. Eyes: Negative for visual changes. ENT: Negative for sore throat. Cardiovascular: Positive for left-sided chest pain Respiratory: Negative for shortness of breath. Gastrointestinal: Negative for abdominal pain, vomiting and diarrhea. Genitourinary: Negative for dysuria. Musculoskeletal: Negative for back pain. Skin: Negative for rash. Neurological: Positive for diffuse headache, negative for weakness, positive for paresthesias in her hands and feet  10-point ROS otherwise negative.  ____________________________________________   PHYSICAL EXAM:  VITAL SIGNS: ED Triage Vitals  Enc  Vitals Group     BP 10/14/14 0936 182/92 mmHg     Pulse Rate 10/14/14 0936 76     Resp 10/14/14 0936 20     Temp 10/14/14 0936 98.8 F (37.1 C)     Temp Source 10/14/14 0936 Oral     SpO2 10/14/14 0936 98 %     Weight 10/14/14 0936 198 lb (89.812 kg)     Height 10/14/14 0936  (1.651 m)     Head Cir --      Peak Flow --      Pain Score 10/14/14 0937 8     Pain Loc --      Pain Edu? --      Excl. in GC? --     Constitutional: Alert and oriented. Well appearing and in no distress. Eyes: Conjunctivae are normal. PERRL. Normal extraocular movements. ENT   Head: Normocephalic and atraumatic.   Nose: No congestion/rhinnorhea.   Mouth/Throat: Mucous membranes are moist.   Neck: No stridor. Cardiovascular: Normal rate, regular rhythm. Normal and symmetric distal pulses are present in all extremities. No murmurs, rubs, or gallops. Respiratory: Normal respiratory effort without tachypnea nor retractions. Breath sounds are clear and equal bilaterally. No wheezes/rales/rhonchi. Gastrointestinal: Soft and nontender. No distention. No abdominal bruits.  Musculoskeletal: Nontender with normal range of motion in all extremities. No joint effusions.  No lower extremity tenderness nor edema. Neurologic:  Normal speech and language. No gross focal neurologic deficits are appreciated. Speech is normal. No gait instability. Skin:  Skin is warm, dry and intact. No rash noted. Psychiatric: Mood and affect are normal. Speech and behavior are normal. Patient exhibits appropriate insight and judgment. ____________________________________________  EKG: Interpreted  by me. Normal sinus rhythm with normal axis normal intervals. No evidence of hypertrophy or acute infarction. Rate is 78 beats per minute  ____________________________________________  ED COURSE:  Pertinent labs & imaging results that were available during my care of the patient were reviewed by me and considered in my medical  decision making (see chart for details). We'll check cardiac labs, give IV headache cocktail ____________________________________________    LABS (pertinent positives/negatives)  Labs Reviewed  BASIC METABOLIC PANEL - Abnormal; Notable for the following:    Sodium 132 (*)    Glucose, Bld 119 (*)    Calcium 8.5 (*)    All other components within normal limits  CBC - Abnormal; Notable for the following:    WBC 11.2 (*)    MCV 76.3 (*)    MCH 24.6 (*)    RDW 17.0 (*)    All other components within normal limits  TROPONIN I  PREGNANCY, URINE    ____________________________________________  FINAL ASSESSMENT AND PLAN  Headache, chest pain  Plan: Patient with labs and imaging as dictated above. Patient with unremarkable labs here, headache is improved. She is low risk for acute coronary syndrome. Stable for outpatient follow-up   Emily Filbert, MD   Emily Filbert, MD 10/14/14 [phone]

## 2014-10-14 NOTE — ED Notes (Signed)
Patient stable at dischrge

## 2014-10-29 ENCOUNTER — Encounter: Payer: Self-pay | Admitting: Obstetrics & Gynecology

## 2014-10-29 ENCOUNTER — Other Ambulatory Visit (HOSPITAL_COMMUNITY)
Admission: RE | Admit: 2014-10-29 | Discharge: 2014-10-29 | Disposition: A | Discharge status: Home / Self Care | Payer: 59 | Source: Ambulatory Visit | Attending: Obstetrics & Gynecology | Admitting: Obstetrics & Gynecology

## 2014-10-29 ENCOUNTER — Ambulatory Visit (INDEPENDENT_AMBULATORY_CARE_PROVIDER_SITE_OTHER): Payer: 59 | Admitting: Obstetrics & Gynecology

## 2014-10-29 VITALS — BP 168/99 | HR 110 | Temp 99.1°F | Wt 214.2 lb

## 2014-10-29 DIAGNOSIS — R87619 Unspecified abnormal cytological findings in specimens from cervix uteri: Secondary | ICD-10-CM | POA: Diagnosis present

## 2014-10-29 DIAGNOSIS — Z3202 Encounter for pregnancy test, result negative: Secondary | ICD-10-CM

## 2014-10-29 DIAGNOSIS — R87810 Cervical high risk human papillomavirus (HPV) DNA test positive: Secondary | ICD-10-CM

## 2014-10-29 DIAGNOSIS — R87610 Atypical squamous cells of undetermined significance on cytologic smear of cervix (ASC-US): Secondary | ICD-10-CM | POA: Insufficient documentation

## 2014-10-29 LAB — POCT PREGNANCY, URINE: Preg Test, Ur: NEGATIVE

## 2014-10-29 NOTE — Patient Instructions (Signed)

## 2014-10-29 NOTE — Progress Notes (Signed)
Patient ID: Natalie Chase, female   DOB: 1980/11/26, 34 y.o.   MRN: [id number]  Chief Complaint  Patient presents with  . Follow-up    abnormal pap smear/colpo  Done at [facility]  HPI Natalie Chase is a 34 y.o. female.  G1P1001 Patient's last menstrual period was 10/18/2014 (exact date).   HPI  Indications: Pap smear on June 2016 showed: ASCUS with POSITIVE high risk HPV. Previous colposcopy: none. Prior cervical treatment: no treatment.  Past Medical History  Diagnosis Date  . Asthma   . Hypertension   . Sickle cell trait   . Vaginal Pap smear, abnormal     Past Surgical History  Procedure Laterality Date  . Cholecystectomy      Family History  Problem Relation Age of Onset  . Achondroplasia Mother   . Lupus Mother   . Sickle cell anemia Mother   . Cancer Other     Social History Social History  Substance Use Topics  . Smoking status: Current Every Day Smoker    Types: Cigarettes  . Smokeless tobacco: None  . Alcohol Use: Yes    No Known Allergies  Current Outpatient Prescriptions  Medication Sig Dispense Refill  . cholecalciferol (VITAMIN D) 1000 UNITS tablet Take 2,000 Units by mouth daily.    Marland Kitchen albuterol (PROVENTIL HFA;VENTOLIN HFA) 108 (90 BASE) MCG/ACT inhaler Inhale 1-2 puffs into the lungs every 6 (six) hours as needed for wheezing or shortness of breath. (Patient not taking: Reported on 06/21/2014) 1 Inhaler 1  . diazepam (VALIUM) 5 MG tablet Take 1 tablet (5 mg total) by mouth every 8 (eight) hours as needed for muscle spasms. (Patient not taking: Reported on 10/29/2014) 20 tablet 0  . HYDROcodone-homatropine (HYCODAN) 5-1.5 MG/5ML syrup Take 5 mLs by mouth every 6 (six) hours as needed for cough. (Patient not taking: Reported on 06/21/2014) 120 mL 0  . ibuprofen (ADVIL,MOTRIN) 800 MG tablet Take 1 tablet (800 mg total) by mouth every 8 (eight) hours as needed. (Patient not taking: Reported on 10/29/2014) 30 tablet 0  . loratadine (CLARITIN) 10 MG tablet Take 1  tablet (10 mg total) by mouth daily. One po daily x 5 days (Patient not taking: Reported on 06/21/2014) 20 tablet 0  . predniSONE (DELTASONE) 20 MG tablet 3 tabs po day one, then 2 tabs daily x 4 days (Patient not taking: Reported on 06/21/2014) 11 tablet 0  . Tdap (BOOSTRIX) 5-2.5-18.5 LF-MCG/0.5 injection Inject 0.5 mLs into the muscle once. (Patient not taking: Reported on 10/14/2014) 0.5 mL 0   No current facility-administered medications for this visit.    Review of Systems Review of Systems  Constitutional: Negative.   Genitourinary: Negative for vaginal discharge, menstrual problem and pelvic pain.    Blood pressure 168/99, pulse 110, temperature 99.1 F (37.3 C), weight 214 lb 3.2 oz (97.16 kg), last menstrual period 10/18/2014.  Physical Exam Physical Exam  Constitutional: She is oriented to person, place, and time. She appears well-developed. No distress.  Pulmonary/Chest: Effort normal.  Neurological: She is alert and oriented to person, place, and time.  Psychiatric: She has a normal mood and affect. Her behavior is normal.    Data Reviewed Pap 08/13/14  Assessment    Procedure Details  The risks and benefits of the procedure and Written informed consent obtained.  Speculum placed in vagina and excellent visualization of cervix achieved, cervix swabbed x 3 with acetic acid solution. AWE anterior and small area at 6-700, no lesion in endocervix, SCJ seen Specimens: ECC,  12 and 700  Complications: none.   imp: LSIL  Plan    Specimens labelled and sent to Pathology. Will notify if she needs to return for therapy or pap 12 mo.      Natalie Chase 10/29/2014, 4:03 PM

## 2014-11-04 ENCOUNTER — Telehealth: Payer: Self-pay

## 2014-11-04 NOTE — Telephone Encounter (Signed)
Pt requesting lab results from Thursday.

## 2014-11-06 NOTE — Telephone Encounter (Addendum)
Per Dr Debroah Loop, patient has cin 2 and needs to RTC to discuss results  9/13  1435  Consult w/Dr. Debroah Loop regarding pt's next appt. He plans to offer LEEP procedure to pt and can be done on the same day. Appt has been scheduled for 10/3 @ 1415. Pt was called and informed of Colpo results as well as plan of care per Dr. Debroah Loop. Pt requested a later appt for same day and was offered 2:45 pm.  She agreed and voiced understanding of all information and instructions given.  Diane Day RNC

## 2014-11-30 ENCOUNTER — Other Ambulatory Visit (HOSPITAL_COMMUNITY)
Admission: RE | Admit: 2014-11-30 | Discharge: 2014-11-30 | Disposition: A | Discharge status: Home / Self Care | Payer: 59 | Source: Ambulatory Visit | Attending: Obstetrics & Gynecology | Admitting: Obstetrics & Gynecology

## 2014-11-30 ENCOUNTER — Encounter: Payer: Self-pay | Admitting: Obstetrics & Gynecology

## 2014-11-30 ENCOUNTER — Ambulatory Visit (INDEPENDENT_AMBULATORY_CARE_PROVIDER_SITE_OTHER): Payer: 59 | Admitting: Obstetrics & Gynecology

## 2014-11-30 ENCOUNTER — Ambulatory Visit: Payer: 59 | Admitting: Obstetrics & Gynecology

## 2014-11-30 VITALS — BP 155/88 | HR 101 | Temp 98.8°F | Ht 65.0 in | Wt 214.9 lb

## 2014-11-30 DIAGNOSIS — R87810 Cervical high risk human papillomavirus (HPV) DNA test positive: Principal | ICD-10-CM

## 2014-11-30 DIAGNOSIS — N871 Moderate cervical dysplasia: Secondary | ICD-10-CM | POA: Insufficient documentation

## 2014-11-30 DIAGNOSIS — R87610 Atypical squamous cells of undetermined significance on cytologic smear of cervix (ASC-US): Secondary | ICD-10-CM

## 2014-11-30 DIAGNOSIS — R87619 Unspecified abnormal cytological findings in specimens from cervix uteri: Secondary | ICD-10-CM | POA: Insufficient documentation

## 2014-11-30 DIAGNOSIS — Z3202 Encounter for pregnancy test, result negative: Secondary | ICD-10-CM

## 2014-11-30 LAB — POCT PREGNANCY, URINE: Preg Test, Ur: NEGATIVE

## 2014-11-30 NOTE — Progress Notes (Signed)
G1P1001 Patient's last menstrual period was 11/13/2014 (within days). 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  Pap smear and colposcopy reviewed.   Pap ASCUS positive HPV Colpo Biopsy CIN 2 ECC negative Teflon coated speculum with smoke evacuator placed.  Cervix visualized. Paracervical block placed.  Medium Fisher size loop used to remove cone of cervix using blend of cut and cautery on LEEP machine.  Edges/Base cauterized with Ball.  Monsel's solution used for hemostasis.  Patient tolerated procedure well. EBL 30 ml  Patient given post procedure instructions. Pelvic rest 1 mo  Follow up in 12 months for repeat pap or as needed.  Adam Phenix, MD 11/30/2014

## 2014-11-30 NOTE — Patient Instructions (Signed)
Loop Electrosurgical Excision Procedure Care After Refer to this sheet in the next few weeks. These instructions provide you with information on caring for yourself after your procedure. Your caregiver may also give you more specific instructions. Your treatment has been planned according to current medical practices, but problems sometimes occur. Call your caregiver if you have any problems or questions after your procedure. HOME CARE INSTRUCTIONS   Do not use tampons, douche, or have sexual intercourse for 2 weeks or as directed by your caregiver.  Begin normal activities if you have no or minimal cramping or bleeding, unless directed otherwise by your caregiver.  Take your temperature if you feel sick. Write down your temperature on paper, and tell your caregiver if you have a fever.  Take all medicines as directed by your caregiver.  Keep all your follow-up appointments and Pap tests as directed by your caregiver. SEEK IMMEDIATE MEDICAL CARE IF:   You have bleeding that is heavier or longer than a normal menstrual cycle.  You have bleeding that is bright red.  You have blood clots.  You have a fever.  You have increasing cramps or pain not relieved by medicine.  You develop abdominal pain that does not seem to be related to the same area of earlier cramping and pain.  You are lightheaded, unusually weak, or faint.  You develop painful or bloody urination.  You develop a bad smelling vaginal discharge. MAKE SURE YOU:  Understand these instructions.  Will watch your condition.  Will get help right away if you are not doing well or get worse. Document Released: 10/27/2010 Document Revised: 05/08/2011 Document Reviewed: 10/27/2010 ExitCare Patient Information 2015 [employer]. This information is not intended to replace advice given to you by your health care provider. Make sure you discuss any questions you have with your health care provider.  

## 2014-12-04 ENCOUNTER — Telehealth: Payer: Self-pay | Admitting: *Deleted

## 2014-12-04 NOTE — Telephone Encounter (Deleted)
-----   Message from Adam Phenix, MD sent at 12/03/2014 10:09 AM EDT ----- CIN 2 repeat pap 12 months

## 2014-12-04 NOTE — Telephone Encounter (Signed)
Per Dr. 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 Called [place] and notified her of results and plan of care. She voices understanding.

## 2014-12-07 ENCOUNTER — Telehealth: Payer: Self-pay | Admitting: General Practice

## 2014-12-07 NOTE — Telephone Encounter (Signed)
Opened in error

## 2015-03-05 ENCOUNTER — Encounter: Payer: Self-pay | Admitting: *Deleted

## 2015-03-22 ENCOUNTER — Emergency Department (HOSPITAL_COMMUNITY): Payer: BLUE CROSS/BLUE SHIELD

## 2015-03-22 ENCOUNTER — Emergency Department (HOSPITAL_COMMUNITY)
Admission: EM | Admit: 2015-03-22 | Discharge: 2015-03-22 | Disposition: A | Discharge status: Home / Self Care | Payer: BLUE CROSS/BLUE SHIELD | Attending: Emergency Medicine | Admitting: Emergency Medicine

## 2015-03-22 ENCOUNTER — Encounter (HOSPITAL_COMMUNITY): Payer: Self-pay | Admitting: Emergency Medicine

## 2015-03-22 DIAGNOSIS — Z79899 Other long term (current) drug therapy: Secondary | ICD-10-CM | POA: Insufficient documentation

## 2015-03-22 DIAGNOSIS — I10 Essential (primary) hypertension: Secondary | ICD-10-CM | POA: Insufficient documentation

## 2015-03-22 DIAGNOSIS — R062 Wheezing: Secondary | ICD-10-CM | POA: Diagnosis present

## 2015-03-22 DIAGNOSIS — Z862 Personal history of diseases of the blood and blood-forming organs and certain disorders involving the immune mechanism: Secondary | ICD-10-CM | POA: Diagnosis not present

## 2015-03-22 DIAGNOSIS — F17210 Nicotine dependence, cigarettes, uncomplicated: Secondary | ICD-10-CM | POA: Diagnosis not present

## 2015-03-22 DIAGNOSIS — J45901 Unspecified asthma with (acute) exacerbation: Secondary | ICD-10-CM | POA: Diagnosis not present

## 2015-03-22 MED ORDER — PREDNISONE 50 MG PO TABS: ORAL_TABLET | ORAL | Status: DC

## 2015-03-22 MED ORDER — ALBUTEROL SULFATE HFA 108 (90 BASE) MCG/ACT IN AERS: 1.0000 | INHALATION_SPRAY | Freq: Four times a day (QID) | RESPIRATORY_TRACT | Status: DC | PRN

## 2015-03-22 MED ORDER — ALBUTEROL SULFATE (2.5 MG/3ML) 0.083% IN NEBU
5.0000 mg | INHALATION_SOLUTION | Freq: Once | RESPIRATORY_TRACT | Status: AC
  Administered 2015-03-22: 5 mg via RESPIRATORY_TRACT
  Filled 2015-03-22: qty 6

## 2015-03-22 MED ORDER — PREDNISONE 50 MG PO TABS
50.0000 mg | ORAL_TABLET | Freq: Once | ORAL | Status: AC
  Administered 2015-03-22: 50 mg via ORAL
  Filled 2015-03-22: qty 1

## 2015-03-22 NOTE — ED Notes (Signed)
Awake. Verbally responsive. A/O x4. Resp even and unlabored. Noted occ nonproductive cough. ABC's intact. Family at bedside.

## 2015-03-22 NOTE — ED Notes (Signed)
Pt ambulated in hallway with NT with report of sats at 98% and HR at 98bpm.

## 2015-03-22 NOTE — ED Notes (Signed)
Pt reports productive/brown cough and SOB since Tuesday that has become worse this am. Pt ambulatory. Pt has asthma. Able to answer questions.

## 2015-03-22 NOTE — ED Notes (Signed)
Bed: WA08 Expected date:  Expected time:  Means of arrival:  Comments: 

## 2015-03-22 NOTE — ED Provider Notes (Signed)
CSN: [id number]     Arrival date & time 03/22/15  4696 History   First MD Initiated Contact with Patient 03/22/15 1008     No chief complaint on file.  HPI  Natalie Chase is a 35 year old female with PMHx of asthma, HTN and sickle cell trait presenting with productive cough and wheezing. She reports onset of symptoms approximately 5 days ago. She states her cough is productive of green mucus. She is also having associated chest tightness and wheezing. She states this is typical of her asthma exacerbations. She has had to use her albuterol inhaler approximately 6 times a day. She states this relieves her symptoms but they come back after an hour or two. She states she has used it so much that she is almost out. She also notes associated nasal congestion and sinus headache. Her headache is described as a pressure between her eyes. She has not taken any OTC cold medications. Denies fever, chills, dizziness, syncope, ear pain, eye redness, rhinorrhea, sore throat, neck pain, chest pain, abdominal pain, nausea or vomiting. She does endorse post-nasal drip.   Past Medical History  Diagnosis Date  . Asthma   . Hypertension   . Sickle cell trait (HCC)   . Vaginal Pap smear, abnormal    Past Surgical History  Procedure Laterality Date  . Cholecystectomy     Family History  Problem Relation Age of Onset  . Achondroplasia Mother   . Lupus Mother   . Sickle cell anemia Mother   . Cancer Other    Social History  Substance Use Topics  . Smoking status: Current Every Day Smoker    Types: Cigarettes  . Smokeless tobacco: None  . Alcohol Use: Yes   OB History    Gravida Para Term Preterm AB TAB SAB Ectopic Multiple Living   Review of Systems  HENT: Positive for congestion and postnasal drip.   Respiratory: Positive for cough, chest tightness and wheezing.   All other systems reviewed and are negative.     Allergies  Review of patient's allergies indicates no known  allergies.  Home Medications   Prior to Admission medications   Medication Sig Start Date End Date Taking? Authorizing Provider  albuterol (PROVENTIL HFA;VENTOLIN HFA) 108 (90 Base) MCG/ACT inhaler Inhale 2 puffs into the lungs every 6 (six) hours as needed for wheezing or shortness of breath.   Yes Historical Provider, MD  amLODipine (NORVASC) 10 MG tablet Take 10 mg by mouth daily.   Yes Historical Provider, MD  cholecalciferol (VITAMIN D) 1000 UNITS tablet Take 2,000 Units by mouth daily.   Yes Historical Provider, MD  hydrochlorothiazide (HYDRODIURIL) 25 MG tablet Take 25 mg by mouth daily.   Yes Historical Provider, MD  naproxen sodium (ANAPROX) 220 MG tablet Take 440 mg by mouth daily as needed (headache).   Yes Historical Provider, MD  albuterol (PROVENTIL HFA;VENTOLIN HFA) 108 (90 Base) MCG/ACT inhaler Inhale 1-2 puffs into the lungs every 6 (six) hours as needed for wheezing or shortness of breath. 03/22/15   Natalie Gunkel, PA-C  ibuprofen (ADVIL,MOTRIN) 800 MG tablet Take 1 tablet (800 mg total) by mouth every 8 (eight) hours as needed. 10/14/14   Emily Filbert, MD  predniSONE (DELTASONE) 50 MG tablet Take 1 tablet once a day for 5 days 03/22/15   Natalie Meixner, PA-C   BP 120/68 mmHg  Pulse 81  Temp(Src) 98.9 F (37.2  C) (Oral)  Resp 20  SpO2 100%  LMP 03/13/2015 Physical Exam  Constitutional: She appears well-developed and well-nourished. No distress.  Nontoxic appearing.   HENT:  Head: Normocephalic and atraumatic.  Mouth/Throat: Oropharynx is clear and moist.  Eyes: Conjunctivae are normal. Right eye exhibits no discharge. Left eye exhibits no discharge. No scleral icterus.  Neck: Normal range of motion. Neck supple.  Cardiovascular: Normal rate, regular rhythm and normal heart sounds.   Pulmonary/Chest: Effort normal and breath sounds normal. No respiratory distress. She has no wheezes.  Breathing unlabored. Lungs CTAB. Pt had received proventil prior to my  assessment.  Musculoskeletal: Normal range of motion.  Lymphadenopathy:    She has no cervical adenopathy.  Neurological: She is alert. Coordination normal.  Skin: Skin is warm and dry.  Psychiatric: She has a normal mood and affect. Her behavior is normal.  Nursing note and vitals reviewed.   ED Course  Procedures (including critical care time) Labs Review Labs Reviewed - No data to display  Imaging Review Dg Chest 2 View  03/22/2015  CLINICAL DATA:  Productive cough and fever EXAM: CHEST  2 VIEW COMPARISON:  Jul 01, 2013. FINDINGS: Lungs are clear. Heart size and pulmonary vascularity are normal. No adenopathy. No bone lesions. IMPRESSION: No edema or consolidation. Electronically Signed   By: Bretta Bang III M.D.   On: 03/22/2015 10:43   I have personally reviewed and evaluated these images and lab results as part of my medical decision-making.   EKG Interpretation None      MDM   Final diagnoses:  Asthma exacerbation   Patient presenting with asthma exacerbation. Treated with proventil in ED with improvement in lung sounds and symptoms. Patient ambulated in ED with O2 saturations maintained >90. There are no current signs of respiratory distress. Prednisone given in the ED and pt will be discharged with 5 day burst and albuterol refill. Pt states they are breathing at baseline. Pt has been instructed to follow up with their PCP regarding today's ED visit. Return precautions given in discharge paperwork and discussed with pt at bedside. Pt stable for discharge      Alveta Heimlich, PA-C 03/22/15 1159  Arby Barrette, MD 03/22/15 1609

## 2015-03-22 NOTE — ED Notes (Signed)
Awake. Verbally responsive. A/O x4. Resp even and unlabored. No audible adventitious breath sounds noted. ABC's intact.  

## 2015-03-22 NOTE — Discharge Instructions (Signed)
Asthma, Adult °Asthma is a recurring condition in which the airways tighten and narrow. Asthma can make it difficult to breathe. It can cause coughing, wheezing, and shortness of breath. Asthma episodes, also called asthma attacks, range from minor to life-threatening. Asthma cannot be cured, but medicines and lifestyle changes can help control it. °CAUSES °Asthma is believed to be caused by inherited (genetic) and environmental factors, but its exact cause is unknown. Asthma may be triggered by allergens, lung infections, or irritants in the air. Asthma triggers are different for each person. Common triggers include:  °· Animal dander. °· Dust mites. °· Cockroaches. °· Pollen from trees or grass. °· Mold. °· Smoke. °· Air pollutants such as dust, household cleaners, hair sprays, aerosol sprays, paint fumes, strong chemicals, or strong odors. °· Cold air, weather changes, and winds (which increase molds and pollens in the air). °· Strong emotional expressions such as crying or laughing hard. °· Stress. °· Certain medicines (such as aspirin) or types of drugs (such as beta-blockers). °· Sulfites in foods and drinks. Foods and drinks that may contain sulfites include dried fruit, potato chips, and sparkling grape juice. °· Infections or inflammatory conditions such as the flu, a cold, or an inflammation of the nasal membranes (rhinitis). °· Gastroesophageal reflux disease (GERD). °· Exercise or strenuous activity. °SYMPTOMS °Symptoms may occur immediately after asthma is triggered or many hours later. Symptoms include: °· Wheezing. °· Excessive nighttime or early morning coughing. °· Frequent or severe coughing with a common cold. °· Chest tightness. °· Shortness of breath. °DIAGNOSIS  °The diagnosis of asthma is made by a review of your medical history and a physical exam. Tests may also be performed. These may include: °· Lung function studies. These tests show how much air you breathe in and out. °· Allergy  tests. °· Imaging tests such as X-rays. °TREATMENT  °Asthma cannot be cured, but it can usually be controlled. Treatment involves identifying and avoiding your asthma triggers. It also involves medicines. There are 2 classes of medicine used for asthma treatment:  °· Controller medicines. These prevent asthma symptoms from occurring. They are usually taken every day. °· Reliever or rescue medicines. These quickly relieve asthma symptoms. They are used as needed and provide short-term relief. °Your health care provider will help you create an asthma action plan. An asthma action plan is a written plan for managing and treating your asthma attacks. It includes a list of your asthma triggers and how they may be avoided. It also includes information on when medicines should be taken and when their dosage should be changed. An action plan may also involve the use of a device called a peak flow meter. A peak flow meter measures how well the lungs are working. It helps you monitor your condition. °HOME CARE INSTRUCTIONS  °· Take medicines only as directed by your health care provider. Speak with your health care provider if you have questions about how or when to take the medicines. °· Use a peak flow meter as directed by your health care provider. Record and keep track of readings. °· Understand and use the action plan to help minimize or stop an asthma attack without needing to seek medical care. °· Control your home environment in the following ways to help prevent asthma attacks: °· Do not smoke. Avoid being exposed to secondhand smoke. °· Change your heating and air conditioning filter regularly. °· Limit your use of fireplaces and wood stoves. °· Get rid of pests (such as roaches   and mice) and their droppings. °· Throw away plants if you see mold on them. °· Clean your floors and dust regularly. Use unscented cleaning products. °· Try to have someone else vacuum for you regularly. Stay out of rooms while they are  being vacuumed and for a short while afterward. If you vacuum, use a dust mask from a hardware store, a double-layered or microfilter vacuum cleaner bag, or a vacuum cleaner with a HEPA filter. °· Replace carpet with wood, tile, or vinyl flooring. Carpet can trap dander and dust. °· Use allergy-proof pillows, mattress covers, and box spring covers. °· Wash bed sheets and blankets every week in hot water and dry them in a dryer. °· Use blankets that are made of polyester or cotton. °· Clean bathrooms and kitchens with bleach. If possible, have someone repaint the walls in these rooms with mold-resistant paint. Keep out of the rooms that are being cleaned and painted. °· Wash hands frequently. °SEEK MEDICAL CARE IF:  °· You have wheezing, shortness of breath, or a cough even if taking medicine to prevent attacks. °· The colored mucus you cough up (sputum) is thicker than usual. °· Your sputum changes from clear or white to yellow, green, gray, or bloody. °· You have any problems that may be related to the medicines you are taking (such as a rash, itching, swelling, or trouble breathing). °· You are using a reliever medicine more than 2-3 times per week. °· Your peak flow is still at 50-79% of your personal best after following your action plan for 1 hour. °· You have a fever. °SEEK IMMEDIATE MEDICAL CARE IF:  °· You seem to be getting worse and are unresponsive to treatment during an asthma attack. °· You are short of breath even at rest. °· You get short of breath when doing very little physical activity. °· You have difficulty eating, drinking, or talking due to asthma symptoms. °· You develop chest pain. °· You develop a fast heartbeat. °· You have a bluish color to your lips or fingernails. °· You are light-headed, dizzy, or faint. °· Your peak flow is less than 50% of your personal best. °  °This information is not intended to replace advice given to you by your health care provider. Make sure you discuss any  questions you have with your health care provider. °  °Document Released: 02/13/2005 Document Revised: 11/04/2014 Document Reviewed: 09/12/2012 °Elsevier Interactive Patient Education ©2016 [employer] ° °Asthma Attack Prevention °While you may not be able to control the fact that you have asthma, you can take actions to prevent asthma attacks. The best way to prevent asthma attacks is to maintain good control of your asthma. You can achieve this by: °· Taking your medicines as directed. °· Avoiding things that can irritate your airways or make your asthma symptoms worse (asthma triggers). °· Keeping track of how well your asthma is controlled and of any changes in your symptoms. °· Responding quickly to worsening asthma symptoms (asthma attack). °· Seeking emergency care when it is needed. °WHAT ARE SOME WAYS TO PREVENT AN ASTHMA ATTACK? °Have a Plan °Work with your health care provider to create a written plan for managing and treating your asthma attacks (asthma action plan). This plan includes: °· A list of your asthma triggers and how you can avoid them. °· Information on when medicines should be taken and when their dosages should be changed. °· The use of a device that measures how well your lungs are   working (peak flow meter). °Monitor Your Asthma °Use your peak flow meter and record your results in a journal every day. A drop in your peak flow numbers on one or more days may indicate the start of an asthma attack. This can happen even before you start to feel symptoms. You can prevent an asthma attack from getting worse by following the steps in your asthma action plan. °Avoid Asthma Triggers °Work with your asthma health care provider to find out what your asthma triggers are. This can be done by: °· Allergy testing. °· Keeping a journal that notes when asthma attacks occur and the factors that may have contributed to them. °· Determining if there are other medical conditions that are making your asthma  worse. °Once you have determined your asthma triggers, take steps to avoid them. This may include avoiding excessive or prolonged exposure to: °· Dust. Have someone dust and vacuum your home for you once or twice a week. Using a high-efficiency particulate arrestance (HEPA) vacuum is best. °· Smoke. This includes campfire smoke, forest fire smoke, and secondhand smoke from tobacco products. °· Pet dander. Avoid contact with animals that you know you are allergic to. °· Allergens from trees, grasses or pollens. Avoid spending a lot of time outdoors when pollen counts are high, and on very windy days. °· Very cold, dry, or humid air. °· Mold. °· Foods that contain high amounts of sulfites. °· Strong odors. °· Outdoor air pollutants, such as engine exhaust. °· Indoor air pollutants, such as aerosol sprays and fumes from household cleaners. °· Household pests, including dust mites and cockroaches, and pest droppings. °· Certain medicines, including NSAIDs. Always talk to your health care provider before stopping or starting any new medicines. °Medicines °Take over-the-counter and prescription medicines only as told by your health care provider. Many asthma attacks can be prevented by carefully following your medicine schedule. Taking your medicines correctly is especially important when you cannot avoid certain asthma triggers. °Act Quickly °If an asthma attack does happen, acting quickly can decrease how severe it is and how long it lasts. Take these steps:  °· Pay attention to your symptoms. If you are coughing, wheezing, or having difficulty breathing, do not wait to see if your symptoms go away on their own. Follow your asthma action plan. °· If you have followed your asthma action plan and your symptoms are not improving, call your health care provider or seek immediate medical care at the nearest hospital. °It is important to note how often you need to use your fast-acting rescue inhaler. If you are using your  rescue inhaler more often, it may mean that your asthma is not under control. Adjusting your asthma treatment plan may help you to prevent future asthma attacks and help you to gain better control of your condition. °HOW CAN I PREVENT AN ASTHMA ATTACK WHEN I EXERCISE? °Follow advice from your health care provider about whether you should use your fast-acting inhaler before exercising. Many people with asthma experience exercise-induced bronchoconstriction (EIB). This condition often worsens during vigorous exercise in cold, humid, or dry environments. Usually, people with EIB can stay very active by pre-treating with a fast-acting inhaler before exercising. °  °This information is not intended to replace advice given to you by your health care provider. Make sure you discuss any questions you have with your health care provider. °  °Document Released: 02/01/2009 Document Revised: 11/04/2014 Document Reviewed: 07/16/2014 °[employer]vier Interactive Patient Education ©2016 [employer] ° °

## 2015-03-25 ENCOUNTER — Ambulatory Visit: Payer: Self-pay | Admitting: Obstetrics & Gynecology

## 2015-12-27 ENCOUNTER — Ambulatory Visit: Payer: BLUE CROSS/BLUE SHIELD | Admitting: Obstetrics & Gynecology

## 2016-02-20 ENCOUNTER — Emergency Department (HOSPITAL_COMMUNITY)
Admission: EM | Admit: 2016-02-20 | Discharge: 2016-02-20 | Disposition: A | Discharge status: Home / Self Care | Payer: Self-pay | Attending: Emergency Medicine | Admitting: Emergency Medicine

## 2016-02-20 ENCOUNTER — Encounter (HOSPITAL_COMMUNITY): Payer: Self-pay | Admitting: Emergency Medicine

## 2016-02-20 ENCOUNTER — Emergency Department (HOSPITAL_COMMUNITY): Payer: Self-pay

## 2016-02-20 DIAGNOSIS — I10 Essential (primary) hypertension: Secondary | ICD-10-CM | POA: Insufficient documentation

## 2016-02-20 DIAGNOSIS — R87610 Atypical squamous cells of undetermined significance on cytologic smear of cervix (ASC-US): Secondary | ICD-10-CM

## 2016-02-20 DIAGNOSIS — Y999 Unspecified external cause status: Secondary | ICD-10-CM | POA: Insufficient documentation

## 2016-02-20 DIAGNOSIS — Y92410 Unspecified street and highway as the place of occurrence of the external cause: Secondary | ICD-10-CM | POA: Insufficient documentation

## 2016-02-20 DIAGNOSIS — M25562 Pain in left knee: Secondary | ICD-10-CM | POA: Insufficient documentation

## 2016-02-20 DIAGNOSIS — Y939 Activity, unspecified: Secondary | ICD-10-CM | POA: Insufficient documentation

## 2016-02-20 DIAGNOSIS — S39012A Strain of muscle, fascia and tendon of lower back, initial encounter: Secondary | ICD-10-CM | POA: Insufficient documentation

## 2016-02-20 DIAGNOSIS — R87810 Cervical high risk human papillomavirus (HPV) DNA test positive: Secondary | ICD-10-CM

## 2016-02-20 DIAGNOSIS — S161XXA Strain of muscle, fascia and tendon at neck level, initial encounter: Secondary | ICD-10-CM | POA: Insufficient documentation

## 2016-02-20 DIAGNOSIS — J45909 Unspecified asthma, uncomplicated: Secondary | ICD-10-CM | POA: Insufficient documentation

## 2016-02-20 DIAGNOSIS — M546 Pain in thoracic spine: Secondary | ICD-10-CM | POA: Insufficient documentation

## 2016-02-20 DIAGNOSIS — M79662 Pain in left lower leg: Secondary | ICD-10-CM | POA: Insufficient documentation

## 2016-02-20 DIAGNOSIS — F17210 Nicotine dependence, cigarettes, uncomplicated: Secondary | ICD-10-CM | POA: Insufficient documentation

## 2016-02-20 LAB — I-STAT BETA HCG BLOOD, ED (MC, WL, AP ONLY)

## 2016-02-20 MED ORDER — CYCLOBENZAPRINE HCL 10 MG PO TABS: 10.0000 mg | ORAL_TABLET | Freq: Two times a day (BID) | ORAL | 0 refills | Status: DC | PRN

## 2016-02-20 MED ORDER — ETODOLAC 300 MG PO CAPS: 300.0000 mg | ORAL_CAPSULE | Freq: Three times a day (TID) | ORAL | 0 refills | Status: DC

## 2016-02-20 MED ORDER — AMLODIPINE BESYLATE 10 MG PO TABS: 10.0000 mg | ORAL_TABLET | Freq: Every day | ORAL | 1 refills | Status: DC
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
## 2016-02-20 NOTE — Discharge Instructions (Signed)
Take medications as needed for pain, apply ice to the areas of swelling and pain, follow-up with your primary doctor regarding her blood pressure
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
## 2016-03-06 ENCOUNTER — Ambulatory Visit: Payer: BLUE CROSS/BLUE SHIELD | Admitting: Physician Assistant

## 2016-03-08 DIAGNOSIS — Z23 Encounter for immunization: Secondary | ICD-10-CM | POA: Diagnosis not present

## 2016-03-08 DIAGNOSIS — I10 Essential (primary) hypertension: Secondary | ICD-10-CM | POA: Diagnosis not present

## 2016-03-08 DIAGNOSIS — Z720 Tobacco use: Secondary | ICD-10-CM | POA: Diagnosis not present

## 2016-03-08 DIAGNOSIS — R52 Pain, unspecified: Secondary | ICD-10-CM | POA: Diagnosis not present

## 2016-03-10 ENCOUNTER — Encounter (INDEPENDENT_AMBULATORY_CARE_PROVIDER_SITE_OTHER): Payer: Self-pay | Admitting: Orthopaedic Surgery

## 2016-03-10 ENCOUNTER — Ambulatory Visit (INDEPENDENT_AMBULATORY_CARE_PROVIDER_SITE_OTHER): Payer: BLUE CROSS/BLUE SHIELD | Admitting: Orthopaedic Surgery

## 2016-03-10 VITALS — BP 154/98 | HR 97 | Ht 65.0 in | Wt 210.0 lb

## 2016-03-10 DIAGNOSIS — M25562 Pain in left knee: Secondary | ICD-10-CM | POA: Diagnosis not present

## 2016-03-10 DIAGNOSIS — M545 Low back pain, unspecified: Secondary | ICD-10-CM
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
## 2016-03-20 ENCOUNTER — Telehealth (INDEPENDENT_AMBULATORY_CARE_PROVIDER_SITE_OTHER): Payer: Self-pay | Admitting: *Deleted
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
## 2016-03-21 NOTE — Telephone Encounter (Signed)
Patient would like a note stating that she can return to work with restrictions. She states that she knows she cannot bend, lift, walk for more than 45 min-1 hour. She is also starting PT on Thursday. She would like to know when you want to see her back. Please let Steph know so that she can call patient and patient can come and pick up light duty note tomorrow if you agree.

## 2016-03-21 NOTE — Telephone Encounter (Signed)
I tried to return call. Left voicemail for patient to return my call.

## 2016-03-21 NOTE — Telephone Encounter (Signed)
Pt called back about this appt plus note for work.

## 2016-03-21 NOTE — Telephone Encounter (Signed)
OK for note. Could not remember Stephanie's last name to send to her. Fix note as requested. Ok for her to pick up. thanks

## 2016-03-22 ENCOUNTER — Telehealth (INDEPENDENT_AMBULATORY_CARE_PROVIDER_SITE_OTHER): Payer: Self-pay | Admitting: *Deleted

## 2016-03-22 ENCOUNTER — Encounter (INDEPENDENT_AMBULATORY_CARE_PROVIDER_SITE_OTHER): Payer: Self-pay | Admitting: Radiology

## 2016-03-22 DIAGNOSIS — I10 Essential (primary) hypertension: Secondary | ICD-10-CM | POA: Diagnosis not present

## 2016-03-22 NOTE — Telephone Encounter (Signed)
IC pt and advised letter ready, she will pickup tomorrow

## 2016-03-22 NOTE — Telephone Encounter (Signed)
This is [place]. [place]  I will not be back in that office until Friday. Can you please have someone fix note for patient and call her to pick up? Thanks.

## 2016-03-22 NOTE — Telephone Encounter (Signed)
Patient would like a refill on her pain medication. Thank You

## 2016-03-22 NOTE — Telephone Encounter (Signed)
Patient called in this afternoon in regards to her lower back pain and left leg. She would like to know whether or not she can have some pain medicine because she is having trouble walking. Her CB #  [phone]. Thank you

## 2016-03-22 NOTE — Telephone Encounter (Signed)
Sending to you since this is an [place] patient, call pt at [phone]

## 2016-03-23 ENCOUNTER — Ambulatory Visit: Payer: BLUE CROSS/BLUE SHIELD | Attending: Orthopaedic Surgery | Admitting: Physical Therapy

## 2016-03-23 ENCOUNTER — Encounter: Payer: Self-pay | Admitting: Physical Therapy

## 2016-03-23 DIAGNOSIS — M25562 Pain in left knee: Secondary | ICD-10-CM | POA: Diagnosis not present

## 2016-03-23 DIAGNOSIS — M6281 Muscle weakness (generalized): Secondary | ICD-10-CM | POA: Diagnosis not present

## 2016-03-23 DIAGNOSIS — M5440 Lumbago with sciatica, unspecified side: Secondary | ICD-10-CM | POA: Insufficient documentation

## 2016-03-23 DIAGNOSIS — R262 Difficulty in walking, not elsewhere classified: Secondary | ICD-10-CM | POA: Diagnosis not present

## 2016-03-23 NOTE — Telephone Encounter (Signed)
Use aleve 2 po bid. Take with food. I called. Discussed.
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
## 2016-03-30 ENCOUNTER — Encounter: Payer: Self-pay | Admitting: Physical Therapy

## 2016-03-30 ENCOUNTER — Ambulatory Visit: Payer: BLUE CROSS/BLUE SHIELD | Attending: Orthopaedic Surgery | Admitting: Physical Therapy

## 2016-03-30 DIAGNOSIS — M5440 Lumbago with sciatica, unspecified side: Secondary | ICD-10-CM | POA: Insufficient documentation

## 2016-03-30 DIAGNOSIS — M25562 Pain in left knee: Secondary | ICD-10-CM

## 2016-03-30 DIAGNOSIS — M6281 Muscle weakness (generalized): Secondary | ICD-10-CM | POA: Diagnosis not present

## 2016-03-30 DIAGNOSIS — R262 Difficulty in walking, not elsewhere classified: Secondary | ICD-10-CM | POA: Diagnosis not present

## 2016-03-30 NOTE — Therapy (Signed)
[facility] [address] [place], [place], [address] Phone: [phone]   Fax:  [phone]  Physical Therapy Treatment  Patient Details  Name: Natalie Chase MRN: [id number] Date of Birth: 25-Mar-1980 Referring Provider: Dr. Ophelia CharterYates  Encounter Date: 03/30/2016      PT End of Session - 03/30/16 0802    Visit Number 2   Number of Visits 16   Authorization Type BCBS   PT Start Time 0802   PT Stop Time 0843   PT Time Calculation (min) 41 min   Activity Tolerance Patient tolerated treatment well   Behavior During Therapy [facility] for tasks assessed/performed      Past Medical History:  Diagnosis Date  . Asthma   . Hypertension   . Sickle cell trait (HCC)   . Vaginal Pap smear, abnormal     Past Surgical History:  Procedure Laterality Date  . CHOLECYSTECTOMY      There were no vitals filed for this visit.      Subjective Assessment - 03/30/16 0802    Subjective Pt reports she has been doing her exercises. took medications around 6 this morning. Pt is preparing for cruise    Currently in Pain? Yes   Pain Score 6    Pain Location Back   Pain Orientation Lower;Left   Pain Descriptors / Indicators --  pulling   Pain Score 6   Pain Location Knee   Pain Orientation Left                         [facility] Adult PT Treatment/Exercise - 03/30/16 0001      Exercises   Exercises Knee/Hip;Lumbar     Lumbar Exercises: Standing   Other Standing Lumbar Exercises feet side-by-side, hip width with arm swing     Lumbar Exercises: Supine   Other Supine Lumbar Exercises hooklying ball squeeze   Other Supine Lumbar Exercises bed mobility     Knee/Hip Exercises: Aerobic   Nustep 8 min, within range of knee flexion     Knee/Hip Exercises: Standing   Gait Training swing through, heel toe, trunk rotation     Modalities   Modalities Moist Heat     Moist Heat Therapy   Number Minutes Moist Heat --  concurrent with  manual   Moist Heat Location Hip  L     Manual Therapy   Manual Therapy Manual Traction;Soft tissue mobilization   Soft tissue mobilization L hamstrings   Manual Traction Gr 2 L long axis LE, Gr4 R long axis LE                PT Education - 03/30/16 0810    Education provided Yes   Education Details muscle spasm resulting in stiffness and pain, exercise form/rationale   Person(s) Educated Patient   Methods Explanation;Demonstration;Tactile cues;Verbal cues   Comprehension Verbalized understanding;Returned demonstration;Verbal cues required;Tactile cues required;Need further instruction          PT Short Term Goals - 03/23/16 1429      PT SHORT TERM GOAL #1   Title Pt will be independent with her HEP   Time 4   Period Weeks   Status New     PT SHORT TERM GOAL #2   Title Pt will be able to perform supine to sit, and sit to supine independently.    Baseline minimal assistance   Time 4   Period Weeks   Status New  PT Long Term Goals - 03/23/16 1429      PT LONG TERM GOAL #1   Title pt will improve her left knee flexion to >/= 110 degrees actively in order to improve gait and transfers.    Time 8   Period Weeks   Status New     PT LONG TERM GOAL #2   Title Pt will improve her lumbar flexion in order to pick an object off the floor safely using correct body mechanics.    Time 8   Period Weeks   Status New     PT LONG TERM GOAL #3   Title Pt will imporve Left LE strength to >/= 4/5 in order to improve gait and functional mobility.    Time 8   Period Weeks   Status New     PT LONG TERM GOAL #4   Title Pt will be able to perform sit to stand with no UE support independently in less than 5 seconds.    Time 8   Period Weeks   Status New     PT LONG TERM GOAL #5   Title Pt will be able to amb 30 minutes with step through gait pattern with no complaints of pain in order to perform routine grocery shopping.    Time 8   Period Weeks   Status  New               Plan - 03/30/16 1610    Clinical Impression Statement Pt with notable pelvic rotation (R post, L ant) resuling in functional leg length discrepancy (L longer than R). Was able to decrease rotation but limited by L hamstring spasm. Pt reported feeling "taller" and "like my L leg is tired" following treatment.    PT Next Visit Plan innominate rotation, knee flexion   PT Home Exercise Plan PPT, quad sets, heel slides, hooklying ball squeeze, smoothe gait   Consulted and Agree with Plan of Care Patient      Patient will benefit from skilled therapeutic intervention in order to improve the following deficits and impairments:     Visit Diagnosis: Acute left-sided low back pain with sciatica, sciatica laterality unspecified  Difficulty in walking, not elsewhere classified  Muscle weakness (generalized)  Acute pain of left knee     Problem List Patient Active Problem List   Diagnosis Date Noted  . 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 ASCUS with positive high risk HPV cervical 10/29/2014    Natalie Chase PT, DPT 03/30/16 1:38 PM   [facility] [address] [place], [place], [address] Phone: [phone]   Fax:  [phone]  Name: Natalie Chase MRN: [id number] Date of Birth: 1981/02/18

## 2016-03-31 ENCOUNTER — Ambulatory Visit: Payer: BLUE CROSS/BLUE SHIELD | Admitting: Physical Therapy

## 2016-04-05 ENCOUNTER — Ambulatory Visit: Payer: BLUE CROSS/BLUE SHIELD | Admitting: Physical Therapy

## 2016-04-06 ENCOUNTER — Ambulatory Visit: Payer: BLUE CROSS/BLUE SHIELD | Admitting: Physical Therapy

## 2016-04-06 ENCOUNTER — Encounter: Payer: Self-pay | Admitting: Physical Therapy

## 2016-04-06 DIAGNOSIS — M6281 Muscle weakness (generalized): Secondary | ICD-10-CM | POA: Diagnosis not present

## 2016-04-06 DIAGNOSIS — M5440 Lumbago with sciatica, unspecified side: Secondary | ICD-10-CM | POA: Diagnosis not present

## 2016-04-06 DIAGNOSIS — M25562 Pain in left knee: Secondary | ICD-10-CM

## 2016-04-06 DIAGNOSIS — R262 Difficulty in walking, not elsewhere classified: Secondary | ICD-10-CM

## 2016-04-06 NOTE — Therapy (Signed)
[facility] [address] [place], [place], [address] Phone: [phone]   Fax:  [phone]  Physical Therapy Treatment  Patient Details  Name: Natalie GinsShirley Chase MRN: [id number] Date of Birth: February 27, 1981 Referring Provider: Dr. Ophelia CharterYates  Encounter Date: 04/06/2016      PT End of Session - 04/06/16 0932    Visit Number 3   Number of Visits 16   Authorization Type BCBS   PT Start Time 0932   PT Stop Time 1011   PT Time Calculation (min) 39 min   Activity Tolerance Patient limited by pain   Behavior During Therapy [facility] for tasks assessed/performed      Past Medical History:  Diagnosis Date  . Asthma   . Hypertension   . Sickle cell trait (HCC)   . Vaginal Pap smear, abnormal     Past Surgical History:  Procedure Laterality Date  . CHOLECYSTECTOMY      There were no vitals filed for this visit.      Subjective Assessment - 04/06/16 0932    Subjective pt reports she still has pain but can tell a difference. Cancelled last visit due to "charlie horse" sensation across L chest. Feels that it was a muscle spasm rather than cardiac in nature.    Currently in Pain? Yes   Pain Score 7    Pain Location Chest   Pain Orientation Anterior   Pain Descriptors / Indicators Sharp   Aggravating Factors  deep breaths   Pain Score 6   Pain Location Knee   Pain Orientation Left   Pain Descriptors / Indicators --  pulling, weak                         [facility] Adult PT Treatment/Exercise - 04/06/16 0001      Exercises   Exercises Shoulder     Knee/Hip Exercises: Stretches   Passive Hamstring Stretch Limitations seated EOB, upright posture     Knee/Hip Exercises: Aerobic   Stationary Bike 5 min, no resistance     Knee/Hip Exercises: Supine   Short Arc Quad Sets 2 sets;10 reps     Shoulder Exercises: Seated   Retraction Limitations retraction with cervical retraction   External Rotation 20 reps   Theraband Level  (Shoulder External Rotation) Level 1 (Yellow)     Shoulder Exercises: Stretch   Other Shoulder Stretches supine position for pec stretch- with moist heat     Manual Therapy   Manual therapy comments heat on pectoralis during manual treatment   Soft tissue mobilization L distal pectoralis paired with stretching   Manual Traction cervical 2x1 min holds                PT Education - 04/06/16 1014    Education provided Yes   Education Details stretching with spasm, rationale for treatments   Person(s) Educated Patient   Methods Explanation;Demonstration;Tactile cues;Verbal cues;Handout   Comprehension Verbalized understanding;Returned demonstration;Verbal cues required;Tactile cues required;Need further instruction          PT Short Term Goals - 03/23/16 1429      PT SHORT TERM GOAL #1   Title Pt will be independent with her HEP   Time 4   Period Weeks   Status New     PT SHORT TERM GOAL #2   Title Pt will be able to perform supine to sit, and sit to supine independently.    Baseline minimal assistance   Time 4  Period Weeks   Status New           PT Long Term Goals - 03/23/16 1429      PT LONG TERM GOAL #1   Title pt will improve her left knee flexion to >/= 110 degrees actively in order to improve gait and transfers.    Time 8   Period Weeks   Status New     PT LONG TERM GOAL #2   Title Pt will improve her lumbar flexion in order to pick an object off the floor safely using correct body mechanics.    Time 8   Period Weeks   Status New     PT LONG TERM GOAL #3   Title Pt will imporve Left LE strength to >/= 4/5 in order to improve gait and functional mobility.    Time 8   Period Weeks   Status New     PT LONG TERM GOAL #4   Title Pt will be able to perform sit to stand with no UE support independently in less than 5 seconds.    Time 8   Period Weeks   Status New     PT LONG TERM GOAL #5   Title Pt will be able to amb 30 minutes with step  through gait pattern with no complaints of pain in order to perform routine grocery shopping.    Time 8   Period Weeks   Status New               Plan - 04/06/16 1012    Clinical Impression Statement Pt verbalized severe pain and facial wincing with scapular retraction due to L pectoralis pain. after treatment, pt was able to use L arm to reach behind her to grab phone without wincing. Pt presented with equal leg length today indicating decreased hamstring spasm, cont to hesitate to flex knee due to pain but was able to make full revolutions on recumbant bike. Pt required extra time for transitions and mobility which were all monitored and cued for posture and ergonomics.    PT Next Visit Plan abdominal engagement, pectoralis stretch,cervical retractions, knee flexion via heel slides, DN when available (L QL)   PT Home Exercise Plan PPT, quad sets, heel slides, hooklying ball squeeze, smoothe gait, scap retraction, ext rotation yellow tband, hamstring stretch seated, supine pec stretch   Consulted and Agree with Plan of Care Patient      Patient will benefit from skilled therapeutic intervention in order to improve the following deficits and impairments:     Visit Diagnosis: Acute left-sided low back pain with sciatica, sciatica laterality unspecified  Difficulty in walking, not elsewhere classified  Muscle weakness (generalized)  Acute pain of left knee     Problem List Patient Active Problem List   Diagnosis Date Noted  . 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 ASCUS with positive high risk HPV cervical 10/29/2014    Natalie Chase PT, DPT 04/06/16 10:18 AM   [facility] [address] [place], [place], [address] Phone: [phone]   Fax:  [phone]  Name: Natalie Chase MRN: [id number] Date of Birth: [birth year]

## 2016-04-07 ENCOUNTER — Ambulatory Visit: Payer: BLUE CROSS/BLUE SHIELD | Admitting: Physical Therapy

## 2016-04-07 DIAGNOSIS — M5440 Lumbago with sciatica, unspecified side: Secondary | ICD-10-CM | POA: Diagnosis not present

## 2016-04-07 DIAGNOSIS — M25562 Pain in left knee: Secondary | ICD-10-CM

## 2016-04-07 DIAGNOSIS — M6281 Muscle weakness (generalized): Secondary | ICD-10-CM | POA: Diagnosis not present

## 2016-04-07 DIAGNOSIS — R262 Difficulty in walking, not elsewhere classified: Secondary | ICD-10-CM | POA: Diagnosis not present

## 2016-04-07 NOTE — Therapy (Signed)
[facility] [address] [place], [place], [address] Phone: [phone]   Fax:  [phone]  Physical Therapy Treatment  Patient Details  Name: Natalie Chase MRN: [id number] Date of Birth: 03-17-1980 Referring Provider: Dr. Ophelia Charter  Encounter Date: 04/07/2016      PT End of Session - 04/07/16 0929    Visit Number 4   Number of Visits 16   Date for PT Re-Evaluation 05/21/16   PT Start Time 0845   PT Stop Time 0940   PT Time Calculation (min) 55 min   Activity Tolerance Patient tolerated treatment well   Behavior During Therapy [facility] for tasks assessed/performed      Past Medical History:  Diagnosis Date  . Asthma   . Hypertension   . Sickle cell trait (HCC)   . Vaginal Pap smear, abnormal     Past Surgical History:  Procedure Laterality Date  . CHOLECYSTECTOMY      There were no vitals filed for this visit.      Subjective Assessment - 04/07/16 0844    Subjective "I have been feeling the same. Tightness in my chest has been improving with the exercises."   Currently in Pain? Yes   Pain Score 3    Pain Location Chest   Pain Orientation Anterior   Aggravating Factors  certain movements   Pain Relieving Factors muscle relaxers, heat   Pain Score 5   Pain Location Knee   Pain Orientation Left   Aggravating Factors  prolonged walking/ standing   Pain Relieving Factors muscle relaxers, heat                         [facility] Adult PT Treatment/Exercise - 04/07/16 0001      Neck Exercises: Supine   Neck Retraction --     Lumbar Exercises: Stretches   Lower Trunk Rotation 5 reps;10 seconds     Lumbar Exercises: Aerobic   Stationary Bike Nu-Step L 4 x 5 min  UE/LE     Lumbar Exercises: Supine   Other Supine Lumbar Exercises abdominal bracing with alternating knee ext     Moist Heat Therapy   Number Minutes Moist Heat 10 Minutes   Moist Heat Location Lumbar Spine  L      Manual Therapy    Manual therapy comments manual QL stretching with pt in L sidelying over rolled towels    Soft tissue mobilization IASTM over L QL          Trigger Point Dry Needling - 04/07/16 0849    Consent Given? Yes   Education Handout Provided Yes   Muscles Treated Upper Body Quadratus Lumborum  L QL lying over bolster for stretch              PT Education - 04/07/16 0907    Education provided Yes   Education Details anatomy regarding the formation of trigger points. what TPDN is, benefits, what to expect and after care.    Person(s) Educated Patient   Methods Explanation;Verbal cues   Comprehension Verbalized understanding;Verbal cues required          PT Short Term Goals - 03/23/16 1429      PT SHORT TERM GOAL #1   Title Pt will be independent with her HEP   Time 4   Period Weeks   Status New     PT SHORT TERM GOAL #2   Title Pt will be able to perform supine to  sit, and sit to supine independently.    Baseline minimal assistance   Time 4   Period Weeks   Status New           PT Long Term Goals - 03/23/16 1429      PT LONG TERM GOAL #1   Title pt will improve her left knee flexion to >/= 110 degrees actively in order to improve gait and transfers.    Time 8   Period Weeks   Status New     PT LONG TERM GOAL #2   Title Pt will improve her lumbar flexion in order to pick an object off the floor safely using correct body mechanics.    Time 8   Period Weeks   Status New     PT LONG TERM GOAL #3   Title Pt will imporve Left LE strength to >/= 4/5 in order to improve gait and functional mobility.    Time 8   Period Weeks   Status New     PT LONG TERM GOAL #4   Title Pt will be able to perform sit to stand with no UE support independently in less than 5 seconds.    Time 8   Period Weeks   Status New     PT LONG TERM GOAL #5   Title Pt will be able to amb 30 minutes with step through gait pattern with no complaints of pain in order to perform routine  grocery shopping.    Time 8   Period Weeks   Status New               Plan - 04/07/16 1132    Clinical Impression Statement pt reported decrease in chest pain today at 3/10 after doing her HEP, but that her knee pain was only a little better at 5/10. DN was performed by licensed PT. Afterwards pt reported significant decrease in pain and ease with getting from sitting to standing. Reported knee pain had decreased to a 4/10 after exercise. MHP used post session.    PT Next Visit Plan transitions into and out of bed, abdominal engagement, pectoralis stretch,cervical retractions, knee flexion via heel slides, DN when available (L QL)   PT Home Exercise Plan PPT, quad sets, heel slides, hooklying ball squeeze, smoothe gait, scap retraction, ext rotation yellow tband, hamstring stretch seated, supine pec stretch   Consulted and Agree with Plan of Care Patient      Patient will benefit from skilled therapeutic intervention in order to improve the following deficits and impairments:  Pain, Postural dysfunction, Decreased mobility, Impaired perceived functional ability, Decreased strength, Decreased range of motion, Decreased activity tolerance, Difficulty walking, Decreased balance  Visit Diagnosis: Acute left-sided low back pain with sciatica, sciatica laterality unspecified  Difficulty in walking, not elsewhere classified  Muscle weakness (generalized)  Acute pain of left knee     Problem List Patient Active Problem List   Diagnosis Date Noted  . 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 ASCUS with positive high risk HPV cervical 10/29/2014    Zada GirtHaley Saskia Simerson, SPT 04/07/2016, 11:40 AM  [facility] [address] [place], [place], [address] Phone: [phone]   Fax:  [phone]  Name: Natalie Chase MRN: [id number] Date of Birth: May 07, 1980

## 2016-04-10 ENCOUNTER — Ambulatory Visit (INDEPENDENT_AMBULATORY_CARE_PROVIDER_SITE_OTHER): Payer: BLUE CROSS/BLUE SHIELD | Admitting: Obstetrics & Gynecology

## 2016-04-10 ENCOUNTER — Encounter: Payer: Self-pay | Admitting: Obstetrics & Gynecology

## 2016-04-10 VITALS — BP 128/92 | HR 114 | Ht 65.0 in | Wt 209.0 lb

## 2016-04-10 DIAGNOSIS — Z1151 Encounter for screening for human papillomavirus (HPV): Secondary | ICD-10-CM | POA: Diagnosis not present

## 2016-04-10 DIAGNOSIS — Z124 Encounter for screening for malignant neoplasm of cervix: Secondary | ICD-10-CM

## 2016-04-10 DIAGNOSIS — N871 Moderate cervical dysplasia: Secondary | ICD-10-CM | POA: Diagnosis not present

## 2016-04-10 DIAGNOSIS — Z01419 Encounter for gynecological examination (general) (routine) without abnormal findings: Secondary | ICD-10-CM

## 2016-04-10 DIAGNOSIS — Z113 Encounter for screening for infections with a predominantly sexual mode of transmission: Secondary | ICD-10-CM

## 2016-04-10 NOTE — Progress Notes (Signed)
Patient ID: Natalie Chase, female   DOB: October 12, 1980, 36 y.o.   MRN: [id number]  Chief Complaint  Patient presents with  . Gynecologic Exam    HPI Natalie Chase is a 36 y.o. female.  [id number] Patient's last menstrual period was 03/30/2016 (exact date). Patient needs f/u pap after LEEP 11/2014  HPI  Past Medical History:  Diagnosis Date  . Asthma   . Hypertension   . Sickle cell trait (HCC)   . Vaginal Pap smear, abnormal     Past Surgical History:  Procedure Laterality Date  . CHOLECYSTECTOMY      Family History  Problem Relation Age of Onset  . Achondroplasia Mother   . Lupus Mother   . Sickle cell anemia Mother   . Cancer Other     Social History Social History  Substance Use Topics  . Smoking status: Current Every Day Smoker    Types: Cigarettes  . Smokeless tobacco: Former NeurosurgeonUser  . Alcohol use Yes    No Known Allergies  Current Outpatient Prescriptions  Medication Sig Dispense Refill  . albuterol (PROVENTIL HFA;VENTOLIN HFA) 108 (90 Base) MCG/ACT inhaler Inhale 1-2 puffs into the lungs every 6 (six) hours as needed for wheezing or shortness of breath. 1 Inhaler 0  . amLODipine (NORVASC) 10 MG tablet Take 1 tablet (10 mg total) by mouth daily. 30 tablet 1  . cholecalciferol (VITAMIN D) 1000 UNITS tablet Take 2,000 Units by mouth daily.    . cyclobenzaprine (FLEXERIL) 10 MG tablet Take 1 tablet (10 mg total) by mouth 2 (two) times daily as needed for muscle spasms. 20 tablet 0  . hydrochlorothiazide (HYDRODIURIL) 25 MG tablet Take 25 mg by mouth daily.    . metoprolol tartrate (LOPRESSOR) 25 MG tablet Take 25 mg by mouth 2 (two) times daily. Pt unsure of dose    . predniSONE (DELTASONE) 50 MG tablet Take 1 tablet once a day for 5 days (Patient not taking: Reported on 03/10/2016) 5 tablet 0   No current facility-administered medications for this visit.     Review of Systems Review of Systems  Constitutional: Negative.   Gastrointestinal: Negative.    Genitourinary: Negative.     Blood pressure (!) 128/92, pulse (!) 114, height 5\' 5"  (1.651 m), weight 209 lb (94.8 kg), last menstrual period 03/30/2016.  Physical Exam Physical Exam  Constitutional: She appears well-developed. No distress.  Cardiovascular: Normal rate.   Pulmonary/Chest: Effort normal.  Breasts: breasts appear normal, no suspicious masses, no skin or nipple changes or axillary nodes.   Abdominal: Soft.  Genitourinary: Vagina normal and uterus normal. No vaginal discharge found.  Psychiatric: She has a normal mood and affect. Her behavior is normal.  Vitals reviewed.   Data Reviewed Pap and Bx results  Assessment    Well woman exam normal H/O CIN 2     Patient Active Problem List   Diagnosis Date Noted  . 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 ASCUS with positive high risk HPV cervical 10/29/2014     Plan    Repeat pap in 1 year if normal today        Scheryl DarterJames Rhythm Gubbels 04/10/2016, 9:37 AM

## 2016-04-11 LAB — CYTOLOGY - PAP
Chlamydia: NEGATIVE
DIAGNOSIS: NEGATIVE
HPV: NOT DETECTED
NEISSERIA GONORRHEA: NEGATIVE
TRICH (WINDOWPATH): NEGATIVE

## 2016-04-11 LAB — RPR

## 2016-04-11 LAB — HIV ANTIBODY (ROUTINE TESTING W REFLEX): HIV: NONREACTIVE

## 2016-04-11 LAB — HEPATITIS C ANTIBODY: HCV Ab: NEGATIVE

## 2016-04-11 LAB — HEPATITIS B SURFACE ANTIGEN: Hepatitis B Surface Ag: NEGATIVE

## 2016-04-12 ENCOUNTER — Ambulatory Visit: Payer: BLUE CROSS/BLUE SHIELD | Admitting: Physical Therapy

## 2016-04-12 DIAGNOSIS — M6281 Muscle weakness (generalized): Secondary | ICD-10-CM | POA: Diagnosis not present

## 2016-04-12 DIAGNOSIS — R262 Difficulty in walking, not elsewhere classified: Secondary | ICD-10-CM

## 2016-04-12 DIAGNOSIS — M5440 Lumbago with sciatica, unspecified side: Secondary | ICD-10-CM

## 2016-04-12 DIAGNOSIS — M25562 Pain in left knee: Secondary | ICD-10-CM | POA: Diagnosis not present

## 2016-04-13 NOTE — Therapy (Signed)
[place], [place], [address] Phone: [phone]   Fax:  [phone]  Physical Therapy Treatment  Patient Details  Name: Natalie Chase MRN: [id number] Date of Birth: [birth year] Referring Provider: Dr. Lorin Mercy  Encounter Date: 04/12/2016      PT End of Session - 04/12/16 1350    Visit Number 5   Number of Visits 16   Date for PT Re-Evaluation 05/21/16   PT Start Time 0100   PT Stop Time 0202   PT Time Calculation (min) 62 min      Past Medical History:  Diagnosis Date  . Asthma   . Hypertension   . Sickle cell trait ([place])   . Vaginal Pap smear, abnormal     Past Surgical History:  Procedure Laterality Date  . CHOLECYSTECTOMY      There were no vitals filed for this visit.      Subjective Assessment - 04/12/16 1303    Subjective I am able to do the exercises easier.    Currently in Pain? Yes   Pain Score 5    Pain Location Back   Pain Orientation Left   Pain Score 3   Pain Location Knee   Pain Orientation Left            [facility] PT Assessment - 04/13/16 0001      AROM   Left Knee Flexion 110  supine                    [facility] Adult PT Treatment/Exercise - 04/13/16 0001      Lumbar Exercises: Stretches   Lower Trunk Rotation 5 reps;10 seconds   Piriformis Stretch Limitations 1 rep each of modified figure 4 and attempted modified knee to opposite shoulder with pt unable to get knee to mid line      Lumbar Exercises: Aerobic   Stationary Bike Nu-Step L 4 x 5 min  UE/LE     Lumbar Exercises: Supine   Ab Set 10 reps   Clam 10 reps   Clam Limitations feet wide and narrow    Other Supine Lumbar Exercises hooklying ball squeeze   Other Supine Lumbar Exercises abdominal bracing with alternating knee ext     Modalities   Modalities Electrical Stimulation     Electrical Stimulation   Electrical Stimulation Location left lumbar/hip   Electrical Stimulation Action IFC  x 15    Electrical Stimulation Parameters 9 ma   Electrical Stimulation Goals Pain     Manual Therapy   Manual therapy comments manual QL stretching with pt in L sidelying over rolled towels    Soft tissue mobilization IASTM over L QL                  PT Short Term Goals - 04/12/16 1333      PT SHORT TERM GOAL #1   Title Pt will be independent with her HEP   Time 4   Period Weeks   Status Achieved     PT SHORT TERM GOAL #2   Title Pt will be able to perform supine to sit, and sit to supine independently.    Time 4   Period Weeks   Status Achieved           PT Long Term Goals - 04/13/16 1029      PT LONG TERM GOAL #1   Title pt will improve her left knee flexion to >/= 110 degrees actively in  order to improve gait and transfers.    Time 8   Period Weeks   Status Achieved     PT LONG TERM GOAL #2   Title Pt will improve her lumbar flexion in order to pick an object off the floor safely using correct body mechanics.    Time 8   Period Weeks   Status Unable to assess     PT LONG TERM GOAL #3   Title Pt will imporve Left LE strength to >/= 4/5 in order to improve gait and functional mobility.    Time 8   Period Weeks   Status Unable to assess     PT LONG TERM GOAL #4   Title Pt will be able to perform sit to stand with no UE support independently in less than 5 seconds.    Time 8   Period Weeks   Status Unable to assess     PT LONG TERM GOAL #5   Title Pt will be able to amb 30 minutes with step through gait pattern with no complaints of pain in order to perform routine grocery shopping.    Time 8   Period Weeks   Status Unable to assess               Plan - 04/12/16 1333    Clinical Impression Statement Pt reports TPDN was helpful. She was very sore. Pt is independent with initial HEP and can transfer sit-supine-sit without assistance. She continues to be guaraded with all motions. Increased pain with attempts at piriformis stretching or  clam shells. Her knee flexion AROM has improved to 110 degrees. Trial of IFC for pain control. Will assess next visit.  LTG#1 met.    PT Next Visit Plan assess response to IFC; transitions into and out of bed, abdominal engagement, pectoralis stretch,cervical retractions, knee flexion via heel slides, DN when available (L QL)   PT Home Exercise Plan PPT, quad sets, heel slides, hooklying ball squeeze, smoothe gait, scap retraction, ext rotation yellow tband, hamstring stretch seated, supine pec stretch   Consulted and Agree with Plan of Care Patient      Patient will benefit from skilled therapeutic intervention in order to improve the following deficits and impairments:  Pain, Postural dysfunction, Decreased mobility, Impaired perceived functional ability, Decreased strength, Decreased range of motion, Decreased activity tolerance, Difficulty walking, Decreased balance  Visit Diagnosis: Acute left-sided low back pain with sciatica, sciatica laterality unspecified  Difficulty in walking, not elsewhere classified  Muscle weakness (generalized)  Acute pain of left knee     Problem List Patient Active Problem List   Diagnosis Date Noted  . 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 ASCUS with positive high risk HPV cervical 10/29/2014    Dorene Ar, PTA 04/13/2016, 10:34 AM  [place], [place], [address] Phone: [phone]   Fax:  [phone]  Name: Natalie Chase MRN: [id number] Date of Birth: 1980/04/01

## 2016-04-14 ENCOUNTER — Ambulatory Visit: Payer: BLUE CROSS/BLUE SHIELD | Admitting: Physical Therapy

## 2016-04-14 DIAGNOSIS — M6281 Muscle weakness (generalized): Secondary | ICD-10-CM | POA: Diagnosis not present

## 2016-04-14 DIAGNOSIS — M25562 Pain in left knee: Secondary | ICD-10-CM | POA: Diagnosis not present

## 2016-04-14 DIAGNOSIS — R262 Difficulty in walking, not elsewhere classified: Secondary | ICD-10-CM | POA: Diagnosis not present

## 2016-04-14 DIAGNOSIS — M5440 Lumbago with sciatica, unspecified side: Secondary | ICD-10-CM

## 2016-04-14 NOTE — Therapy (Signed)
[facility] [address] [place], [place], [address] Phone: [phone]   Fax:  [phone]  Physical Therapy Treatment  Patient Details  Name: Natalie GinsShirley Krupp MRN: [id number] Date of Birth: 28-May-1980 Referring Provider: Dr. Ophelia CharterYates  Encounter Date: 04/14/2016      PT End of Session - 04/14/16 0727    Visit Number 6   Number of Visits 16   Date for PT Re-Evaluation 05/21/16   Authorization Type [employer]   PT Start Time 0715   PT Stop Time 0821   PT Time Calculation (min) 66 min      Past Medical History:  Diagnosis Date  . Asthma   . Hypertension   . Sickle cell trait (HCC)   . Vaginal Pap smear, abnormal     Past Surgical History:  Procedure Laterality Date  . CHOLECYSTECTOMY      There were no vitals filed for this visit.      Subjective Assessment - 04/14/16 0720    Subjective The electrical stimulation helped me so much. My mobility is better.    Currently in Pain? Yes   Pain Score 4    Pain Location Back   Pain Orientation Left;Lower   Pain Descriptors / Indicators Aching   Pain Radiating Towards left gluteal   Aggravating Factors  transitions   Pain Relieving Factors electrical stimulation   Pain Score 0   Pain Location Knee   Pain Orientation Left   Aggravating Factors  flexion stretching    Pain Relieving Factors not bending  it                         [facility] Adult PT Treatment/Exercise - 04/14/16 0001      Lumbar Exercises: Stretches   Pelvic Tilt Limitations 10 repds, 5 sec    Piriformis Stretch Limitations 2 reps each modified figure four and knee to opposite shoulder, 20 sec, better tolerance today     Lumbar Exercises: Aerobic   Stationary Bike Nu-Step L 4 x 5 min  UE/LE     Lumbar Exercises: Standing   Other Standing Lumbar Exercises QL stretch at wall 10 sec x 3    Other Standing Lumbar Exercises Standing hip abduction and march x 10 each  bilateral      Lumbar  Exercises: Seated   Hip Flexion on Ball Limitations seated pelvic rocking x 10      Lumbar Exercises: Supine   Clam 10 reps   Other Supine Lumbar Exercises hooklying ball squeeze   Other Supine Lumbar Exercises abdominal bracing with alternating knee ext     Electrical Stimulation   Electrical Stimulation Location left lumbar/hip   Electrical Stimulation Action IFC x 15 min   Electrical Stimulation Parameters 9 ma    Electrical Stimulation Goals Pain     Manual Therapy   Manual Therapy Joint mobilization;Muscle Energy Technique   Joint Mobilization A/P mobs grade 2 left hip followed by PROM ER/IR, hip flexion all with increased pain    Manual Traction Gr 2 L long axis L LE   Muscle Energy Technique side lying resisted hip flexion with anterior inominate mobilization-to correct left posterior inominate rotation.                 PT Education - 04/14/16 0818    Education provided Yes   Education Details HEP   Person(s) Educated Patient   Methods Explanation;Handout   Comprehension Verbalized understanding  PT Short Term Goals - 04/12/16 1333      PT SHORT TERM GOAL #1   Title Pt will be independent with her HEP   Time 4   Period Weeks   Status Achieved     PT SHORT TERM GOAL #2   Title Pt will be able to perform supine to sit, and sit to supine independently.    Time 4   Period Weeks   Status Achieved           PT Long Term Goals - 04/13/16 1029      PT LONG TERM GOAL #1   Title pt will improve her left knee flexion to >/= 110 degrees actively in order to improve gait and transfers.    Time 8   Period Weeks   Status Achieved     PT LONG TERM GOAL #2   Title Pt will improve her lumbar flexion in order to pick an object off the floor safely using correct body mechanics.    Time 8   Period Weeks   Status Unable to assess     PT LONG TERM GOAL #3   Title Pt will imporve Left LE strength to >/= 4/5 in order to improve gait and functional  mobility.    Time 8   Period Weeks   Status Unable to assess     PT LONG TERM GOAL #4   Title Pt will be able to perform sit to stand with no UE support independently in less than 5 seconds.    Time 8   Period Weeks   Status Unable to assess     PT LONG TERM GOAL #5   Title Pt will be able to amb 30 minutes with step through gait pattern with no complaints of pain in order to perform routine grocery shopping.    Time 8   Period Weeks   Status Unable to assess               Plan - 04/14/16 0751    Clinical Impression Statement Pt reports IFC was helpful last visit. She tolerates hip mobs, passive and active left hip motions better today. Pain decreased to 3/10 after anterior inominate mobilizations with left resisted hip flexion. IFC repeated with HMP.    PT Next Visit Plan cont IFC; transitions into and out of bed, abdominal engagement, pectoralis stretch,cervical retractions, knee flexion via heel slides, DN when available (L QL)   PT Home Exercise Plan PPT, quad sets, heel slides, hooklying ball squeeze, smoothe gait, scap retraction, ext rotation yellow tband, hamstring stretch seated, supine pec stretch   Consulted and Agree with Plan of Care Patient      Patient will benefit from skilled therapeutic intervention in order to improve the following deficits and impairments:  Pain, Postural dysfunction, Decreased mobility, Impaired perceived functional ability, Decreased strength, Decreased range of motion, Decreased activity tolerance, Difficulty walking, Decreased balance  Visit Diagnosis: Acute left-sided low back pain with sciatica, sciatica laterality unspecified  Difficulty in walking, not elsewhere classified  Muscle weakness (generalized)  Acute pain of left knee     Problem List Patient Active Problem List   Diagnosis Date Noted  . 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 ASCUS with positive high risk HPV cervical 10/29/2014    Natalie Chase , PTA 04/14/2016, 8:32 AM  [facility] [address] [place], [place], [address] Phone: [phone]   Fax:  [phone]  Name: Natalie Chase MRN: [id number] Date of Birth: 01-31-1981

## 2016-04-18 DIAGNOSIS — G4700 Insomnia, unspecified: Secondary | ICD-10-CM | POA: Diagnosis not present

## 2016-04-18 DIAGNOSIS — F4310 Post-traumatic stress disorder, unspecified: Secondary | ICD-10-CM | POA: Diagnosis not present

## 2016-04-18 DIAGNOSIS — F4320 Adjustment disorder, unspecified: Secondary | ICD-10-CM | POA: Diagnosis not present

## 2016-04-18 DIAGNOSIS — Z3490 Encounter for supervision of normal pregnancy, unspecified, unspecified trimester: Secondary | ICD-10-CM | POA: Diagnosis not present

## 2016-04-19 ENCOUNTER — Ambulatory Visit: Payer: BLUE CROSS/BLUE SHIELD | Admitting: Physical Therapy

## 2016-04-19 DIAGNOSIS — M25562 Pain in left knee: Secondary | ICD-10-CM | POA: Diagnosis not present

## 2016-04-19 DIAGNOSIS — M5440 Lumbago with sciatica, unspecified side: Secondary | ICD-10-CM

## 2016-04-19 DIAGNOSIS — M6281 Muscle weakness (generalized): Secondary | ICD-10-CM

## 2016-04-19 DIAGNOSIS — R262 Difficulty in walking, not elsewhere classified: Secondary | ICD-10-CM | POA: Diagnosis not present

## 2016-04-19 NOTE — Therapy (Signed)
[place], [place], [address] Phone: [phone]   Fax:  [phone]  Physical Therapy Treatment  Patient Details  Name: Natalie Chase MRN: [id number] Date of Birth: [birth year] Referring Provider: Dr. Lorin Mercy  Encounter Date: 04/19/2016      PT End of Session - 04/19/16 1203    Visit Number 7   Number of Visits 16   Date for PT Re-Evaluation 05/21/16   Authorization Type BCBS   PT Start Time 1140   PT Stop Time 1235   PT Time Calculation (min) 55 min      Past Medical History:  Diagnosis Date  . Asthma   . Hypertension   . Sickle cell trait ([place])   . Vaginal Pap smear, abnormal     Past Surgical History:  Procedure Laterality Date  . CHOLECYSTECTOMY      There were no vitals filed for this visit.      Subjective Assessment - 04/19/16 1143    Currently in Pain? Yes   Pain Score 3    Pain Location Back   Pain Orientation Left;Lower   Pain Descriptors / Indicators Aching   Pain Radiating Towards left hip   Pain Score 0   Pain Location Knee   Pain Orientation Left            [facility] PT Assessment - 04/20/16 0001      Strength   Left Hip Flexion 3/5   Left Knee Flexion 4-/5   Left Knee Extension 4+/5                     [facility] Adult PT Treatment/Exercise - 04/20/16 0001      Lumbar Exercises: Aerobic   Stationary Bike Nu-Step L 4 x 6 min  UE/LE     Lumbar Exercises: Supine   Bridge 10 reps     Knee/Hip Exercises: Standing   Knee Flexion 20 reps   Functional Squat 20 reps   Functional Squat Limitations max cues for technique/ hip hinge, core brace, still painful per patient       Knee/Hip Exercises: Seated   Long Arc Quad 2 sets;10 reps   Long Arc Quad Limitations slight pain/ pulling reported medial knee    Other Seated Knee/Hip Exercises marching less pain ful for lumbar with core bracing , also attempted seated SLR with core brace, also increased lumbar  pain so disc.    Sit to Sand --  unable without UE, painful      Knee/Hip Exercises: Supine   Quad Sets 10 reps   Quad Sets Limitations max veral and tactile cues, able to elicit strong contraction    Short Arc Quad Sets 2 sets;10 reps   Short Arc Quad Sets Limitations min pulling/pain reported medial kne    Straight Leg Raises 10 reps   Straight Leg Raises Limitations cues for abdominal brace, increased LBP so discontinued      Modalities   Modalities Moist Heat     Moist Heat Therapy   Number Minutes Moist Heat 15 Minutes   Moist Heat Location Lumbar Spine  sidelying      Electrical Stimulation   Electrical Stimulation Location left lumbar/hip  sidelying    Electrical Stimulation Action IFC x 15 min   Electrical Stimulation Parameters 11m    Electrical Stimulation Goals Pain                PT Education - 04/19/16 1233  Education provided Yes   Education Details HEP    Person(s) Educated Patient   Methods Explanation;Handout   Comprehension Verbalized understanding          PT Short Term Goals - 04/12/16 1333      PT SHORT TERM GOAL #1   Title Pt will be independent with her HEP   Time 4   Period Weeks   Status Achieved     PT SHORT TERM GOAL #2   Title Pt will be able to perform supine to sit, and sit to supine independently.    Time 4   Period Weeks   Status Achieved           PT Long Term Goals - 04/19/16 0813      PT LONG TERM GOAL #1   Title pt will improve her left knee flexion to >/= 110 degrees actively in order to improve gait and transfers.    Baseline 110   Time 8   Period Weeks   Status Achieved     PT LONG TERM GOAL #2   Title Pt will improve her lumbar flexion in order to pick an object off the floor safely using correct body mechanics.    Baseline unable to try    Time 8   Period Weeks   Status On-going     PT LONG TERM GOAL #3   Title Pt will imporve Left LE strength to >/= 4/5 in order to improve gait and  functional mobility.    Baseline hip flexion left 3/5   Time 8   Period Weeks   Status Partially Met     PT LONG TERM GOAL #4   Title Pt will be able to perform sit to stand with no UE support independently in less than 5 seconds.    Baseline needs UE    Time 8   Period Weeks   Status On-going     PT LONG TERM GOAL #5   Title Pt will be able to amb 30 minutes with step through gait pattern with no complaints of pain in order to perform routine grocery shopping.    Time 8   Period Weeks   Status Unable to assess               Plan - 04/19/16 1145    Clinical Impression Statement Pt reports decreasing back pain. Her left knee feels weak more than painful and she reports intermittent giving away of left knee. She has difficulty achieving strong quad set however she can with verbal and tactile cues. She reports tenderness/ pulling medial knee. She demonstrates lateral tracking. SLR causes increased low back pain in supine and sitting. Advanced knee HEP to include LAQ, standing hamstring curl and bridges. Continued IFC and discussed ways to purchase her own TENS online. Knee strength improved. She is unable to rise from chair without UE assist. She is unable to perform mini squats without increase knee and back pain.    PT Next Visit Plan DN as available to Ql/lumbar; try patella tracking tape, continue lumbar and left knee strengthening exercieses as tolerated.    PT Home Exercise Plan PPT, quad sets, heel slides, hooklying ball squeeze, smoothe gait, scap retraction, ext rotation yellow tband, hamstring stretch seated, supine pec stretch, LAQ, bridge, standing hamstring curls    Consulted and Agree with Plan of Care Patient      Patient will benefit from skilled therapeutic intervention in order to improve the following deficits and  impairments:  Pain, Postural dysfunction, Decreased mobility, Impaired perceived functional ability, Decreased strength, Decreased range of motion,  Decreased activity tolerance, Difficulty walking, Decreased balance  Visit Diagnosis: Acute left-sided low back pain with sciatica, sciatica laterality unspecified  Difficulty in walking, not elsewhere classified  Muscle weakness (generalized)  Acute pain of left knee     Problem List Patient Active Problem List   Diagnosis Date Noted  . 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 ASCUS with positive high risk HPV cervical 10/29/2014    Dorene Ar , PTA 04/20/2016, 8:14 AM  [place], [place], [address] Phone: [phone]   Fax:  [phone]  Name: Arihanna Estabrook MRN: [id number] Date of Birth: [birth year]

## 2016-04-21 ENCOUNTER — Ambulatory Visit: Payer: BLUE CROSS/BLUE SHIELD | Admitting: Physical Therapy

## 2016-04-21 DIAGNOSIS — M25562 Pain in left knee: Secondary | ICD-10-CM

## 2016-04-21 DIAGNOSIS — M5440 Lumbago with sciatica, unspecified side: Secondary | ICD-10-CM | POA: Diagnosis not present

## 2016-04-21 DIAGNOSIS — M6281 Muscle weakness (generalized): Secondary | ICD-10-CM | POA: Diagnosis not present

## 2016-04-21 DIAGNOSIS — R262 Difficulty in walking, not elsewhere classified: Secondary | ICD-10-CM

## 2016-04-21 NOTE — Therapy (Signed)
[place], [place], [address] Phone: [phone]   Fax:  [phone]  Physical Therapy Treatment  Patient Details  Name: Natalie Chase MRN: [id number] Date of Birth: 1981-02-07 Referring Provider: Dr. Lorin Mercy  Encounter Date: 04/21/2016      PT End of Session - 04/21/16 0808    Visit Number 8   Number of Visits 16   Date for PT Re-Evaluation 05/21/16   Authorization Type BCBS   PT Start Time 0800   PT Stop Time 0839   PT Time Calculation (min) 39 min      Past Medical History:  Diagnosis Date  . Asthma   . Hypertension   . Sickle cell trait ([place])   . Vaginal Pap smear, abnormal     Past Surgical History:  Procedure Laterality Date  . CHOLECYSTECTOMY      There were no vitals filed for this visit.      Subjective Assessment - 04/21/16 0804    Subjective The knee exercises are helping.    Currently in Pain? Yes   Pain Score 3    Pain Location Back   Pain Orientation Left;Lower   Pain Descriptors / Indicators Aching   Aggravating Factors  transitions    Pain Relieving Factors estim , keep moving,    Pain Score 3   Pain Location Knee   Pain Orientation Left   Pain Descriptors / Indicators Throbbing   Aggravating Factors  bending it, squatting, sit- to stand    Pain Relieving Factors not squatting or bending it                          [facility] Adult PT Treatment/Exercise - 04/21/16 0001      Lumbar Exercises: Supine   Clam 10 reps   Clam Limitations with ab draw in    Bent Knee Raise 10 reps   Bent Knee Raise Limitations with ab draw in    Bridge 15 reps   Bridge Limitations feet over ball    Other Supine Lumbar Exercises hooklying ball squeeze with ab drace    Other Supine Lumbar Exercises abdominal bracing with alternating knee ext     Knee/Hip Exercises: Standing   Knee Flexion 20 reps   Knee Flexion Limitations less painful with tape    Functional Squat 20  reps   Functional Squat Limitations min cues for hip hinge, less painful with tape apllied to knee. Also less lumbar pain       Knee/Hip Exercises: Seated   Long Arc Quad 2 sets;10 reps   [employer] Limitations feels a little better with the tape on     Knee/Hip Exercises: Supine   Quad Sets 10 reps   Quad Sets Limitations performed in sitting with mcconnels tape applied    Short Arc [employer] 2 sets;10 reps   Short Arc Quad Sets Limitations less pain with tape donned    Straight Leg Raises 10 reps   Straight Leg Raises Limitations abdominal brace with less LBP today.                   PT Short Term Goals - 04/12/16 1333      PT SHORT TERM GOAL #1   Title Pt will be independent with her HEP   Time 4   Period Weeks   Status Achieved     PT SHORT TERM GOAL #2   Title Pt will  be able to perform supine to sit, and sit to supine independently.    Time 4   Period Weeks   Status Achieved           PT Long Term Goals - 04/19/16 0813      PT LONG TERM GOAL #1   Title pt will improve her left knee flexion to >/= 110 degrees actively in order to improve gait and transfers.    Baseline 110   Time 8   Period Weeks   Status Achieved     PT LONG TERM GOAL #2   Title Pt will improve her lumbar flexion in order to pick an object off the floor safely using correct body mechanics.    Baseline unable to try    Time 8   Period Weeks   Status On-going     PT LONG TERM GOAL #3   Title Pt will imporve Left LE strength to >/= 4/5 in order to improve gait and functional mobility.    Baseline hip flexion left 3/5   Time 8   Period Weeks   Status Partially Met     PT LONG TERM GOAL #4   Title Pt will be able to perform sit to stand with no UE support independently in less than 5 seconds.    Baseline needs UE    Time 8   Period Weeks   Status On-going     PT LONG TERM GOAL #5   Title Pt will be able to amb 30 minutes with step through gait pattern with no complaints  of pain in order to perform routine grocery shopping.    Time 8   Period Weeks   Status Unable to assess               Plan - 04/21/16 0826    Clinical Impression Statement Trial of Mcconnels tape to decrease lateral tracking patella. Pt felt decreased pain with all exercises. She will wear it for the next 2 days and then remove. Less lumbar pain reported with exercises today. She declined modalities. No increased pain post session.    PT Next Visit Plan DN as available to Ql/lumbar; assess patella tracking tape, continue lumbar and left knee strengthening exercieses as tolerated.    PT Home Exercise Plan PPT, quad sets, heel slides, hooklying ball squeeze, smoothe gait, scap retraction, ext rotation yellow tband, hamstring stretch seated, supine pec stretch, LAQ, bridge, standing hamstring curls    Consulted and Agree with Plan of Care Patient      Patient will benefit from skilled therapeutic intervention in order to improve the following deficits and impairments:  Pain, Postural dysfunction, Decreased mobility, Impaired perceived functional ability, Decreased strength, Decreased range of motion, Decreased activity tolerance, Difficulty walking, Decreased balance  Visit Diagnosis: Acute left-sided low back pain with sciatica, sciatica laterality unspecified  Difficulty in walking, not elsewhere classified  Muscle weakness (generalized)  Acute pain of left knee     Problem List Patient Active Problem List   Diagnosis Date Noted  . 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 ASCUS with positive high risk HPV cervical 10/29/2014    Dorene Ar, PTA 04/21/2016, 8:48 AM  [place], [place], [address] Phone: [phone]   Fax:  [phone]  Name: Natalie Chase MRN: [id number] Date of Birth: [birth year]

## 2016-04-26 ENCOUNTER — Ambulatory Visit: Payer: BLUE CROSS/BLUE SHIELD | Admitting: Physical Therapy

## 2016-04-28 ENCOUNTER — Ambulatory Visit: Payer: BLUE CROSS/BLUE SHIELD | Attending: Orthopaedic Surgery | Admitting: Physical Therapy

## 2016-04-28 DIAGNOSIS — M6281 Muscle weakness (generalized): Secondary | ICD-10-CM | POA: Insufficient documentation

## 2016-04-28 DIAGNOSIS — M5440 Lumbago with sciatica, unspecified side: Secondary | ICD-10-CM | POA: Insufficient documentation

## 2016-04-28 DIAGNOSIS — R262 Difficulty in walking, not elsewhere classified: Secondary | ICD-10-CM | POA: Insufficient documentation

## 2016-04-28 DIAGNOSIS — M25562 Pain in left knee: Secondary | ICD-10-CM | POA: Insufficient documentation

## 2016-05-03 ENCOUNTER — Ambulatory Visit: Payer: BLUE CROSS/BLUE SHIELD | Admitting: Physical Therapy

## 2016-05-05 ENCOUNTER — Ambulatory Visit: Payer: BLUE CROSS/BLUE SHIELD | Admitting: Physical Therapy

## 2016-05-05 ENCOUNTER — Encounter: Payer: Self-pay | Admitting: Physical Therapy

## 2016-05-05 DIAGNOSIS — M6281 Muscle weakness (generalized): Secondary | ICD-10-CM | POA: Diagnosis not present

## 2016-05-05 DIAGNOSIS — R262 Difficulty in walking, not elsewhere classified: Secondary | ICD-10-CM

## 2016-05-05 DIAGNOSIS — M25562 Pain in left knee: Secondary | ICD-10-CM | POA: Diagnosis not present

## 2016-05-05 DIAGNOSIS — M5440 Lumbago with sciatica, unspecified side: Secondary | ICD-10-CM | POA: Diagnosis not present

## 2016-05-05 NOTE — Therapy (Signed)
[place], [place], [address] Phone: [phone]   Fax:  [phone]  Physical Therapy Treatment  Patient Details  Name: Natalie Chase MRN: [id number] Date of Birth: 1980/03/29 Referring Provider: Dr. Lorin Mercy  Encounter Date: 05/05/2016      PT End of Session - 05/05/16 1103    Visit Number 9   Number of Visits 16   Date for PT Re-Evaluation 05/21/16   Authorization Type BCBS   PT Start Time 1103   PT Stop Time 1143   PT Time Calculation (min) 40 min   Activity Tolerance Patient tolerated treatment well   Behavior During Therapy [facility] for tasks assessed/performed      Past Medical History:  Diagnosis Date  . Asthma   . Hypertension   . Sickle cell trait ([place])   . Vaginal Pap smear, abnormal     Past Surgical History:  Procedure Laterality Date  . CHOLECYSTECTOMY      There were no vitals filed for this visit.      Subjective Assessment - 05/05/16 1110    Subjective Feels like she is getting better. Has been doing water aerobics 2/week at least. Was sick last week. Notices the difference if she skips a day of exercise.    How long can you sit comfortably? 25 min without support, longer with support   How long can you walk comfortably? 45 min-1 hour   Currently in Pain? Yes   Pain Score 3    Pain Location Back   Pain Orientation Lower;Left   Pain Descriptors / Indicators Aching   Aggravating Factors  turning quickly   Pain Relieving Factors gentle movement   Pain Location Knee   Pain Orientation Left   Pain Descriptors / Indicators --  weakness   Aggravating Factors  bending, sit<>stand   Pain Relieving Factors rest            [facility] PT Assessment - 05/05/16 0001      Strength   Left Hip Flexion 4/5   Left Hip Extension 4-/5   Left Hip ABduction 4/5   Left Knee Flexion 4+/5   Left Knee Extension 4+/5                     [facility] Adult PT Treatment/Exercise -  05/05/16 0001      Lumbar Exercises: Stretches   Prone on Elbows Stretch Limitations seated physioball roll out     Lumbar Exercises: Aerobic   Stationary Bike nu step L3 6 min     Lumbar Exercises: Seated   Hip Flexion on Ball Limitations seated iso press into thighs for daily relief of low back pressure     Lumbar Exercises: Supine   Large Ball Abdominal Isometric 10 reps;5 seconds   Large Ball Abdominal Isometric Limitations red physioball   Large Ball Oblique Isometric 10 reps     Knee/Hip Exercises: Stretches   Gastroc Stretch 2 reps;30 seconds   Gastroc Stretch Limitations slant board     Knee/Hip Exercises: Sidelying   Hip ABduction Both;15 reps                PT Education - 05/05/16 1124    Education provided Yes   Education Details progress with goals, HEP, POC   Person(s) Educated Patient   Methods Explanation;Demonstration;Tactile cues;Verbal cues   Comprehension Verbalized understanding;Returned demonstration;Verbal cues required;Tactile cues required;Need further instruction          PT Short Term  Goals - 04/12/16 1333      PT SHORT TERM GOAL #1   Title Pt will be independent with her HEP   Time 4   Period Weeks   Status Achieved     PT SHORT TERM GOAL #2   Title Pt will be able to perform supine to sit, and sit to supine independently.    Time 4   Period Weeks   Status Achieved           PT Long Term Goals - 05/05/16 1106      PT LONG TERM GOAL #1   Title pt will improve her left knee flexion to >/= 110 degrees actively in order to improve gait and transfers.    Status Achieved     PT LONG TERM GOAL #2   Title Pt will improve her lumbar flexion in order to pick an object off the floor safely using correct body mechanics.    Baseline able with hesitation and pulling sensation   Status Partially Met     PT LONG TERM GOAL #3   Title Pt will imporve Left LE strength to >/= 4/5 in order to improve gait and functional mobility.       PT LONG TERM GOAL #4   Title Pt will be able to perform sit to stand with no UE support independently in less than 5 seconds.    Baseline able to demo   Status Achieved     PT LONG TERM GOAL #5   Title Pt will be able to amb 30 minutes with step through gait pattern with no complaints of pain in order to perform routine grocery shopping.    Baseline able to walk 45 min-1 hour   Status Achieved               Plan - 05/05/16 1125    Clinical Impression Statement Pt has made significant improvement toward goals and in functional ability-see goal baselines. Discussed importance of continued mobility and exercise to avoid becoming stiff and sore.  Will continue to progress as tolerated to create final HEP.    PT Next Visit Plan [facility] squatting and mobility, stairs, lifting   PT Home Exercise Plan PPT, quad sets, heel slides, hooklying ball squeeze, smoothe gait, scap retraction, ext rotation yellow tband, hamstring stretch seated, supine pec stretch, LAQ, bridge, standing hamstring curls    Consulted and Agree with Plan of Care Patient      Patient will benefit from skilled therapeutic intervention in order to improve the following deficits and impairments:     Visit Diagnosis: Acute left-sided low back pain with sciatica, sciatica laterality unspecified  Difficulty in walking, not elsewhere classified  Muscle weakness (generalized)  Acute pain of left knee     Problem List Patient Active Problem List   Diagnosis Date Noted  . 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 ASCUS with positive high risk HPV cervical 10/29/2014    Laniece Hornbaker C. Lavaeh Bau PT, DPT 05/05/16 11:54 AM   [place] [facility] [address] [place], [place], [address] Phone: [phone]   Fax:  [phone]  Name: Natalie Chase MRN: [id number] Date of Birth: 12-15-1980

## 2016-05-08 ENCOUNTER — Ambulatory Visit: Payer: BLUE CROSS/BLUE SHIELD | Admitting: Physical Therapy

## 2016-05-09 ENCOUNTER — Encounter: Payer: BLUE CROSS/BLUE SHIELD | Admitting: Physical Therapy

## 2016-05-12 ENCOUNTER — Encounter: Payer: BLUE CROSS/BLUE SHIELD | Admitting: Physical Therapy

## 2016-05-17 ENCOUNTER — Ambulatory Visit: Payer: BLUE CROSS/BLUE SHIELD | Admitting: Physical Therapy

## 2016-05-18 ENCOUNTER — Encounter: Payer: BLUE CROSS/BLUE SHIELD | Admitting: Physical Therapy

## 2016-05-18 ENCOUNTER — Telehealth: Payer: Self-pay | Admitting: Physical Therapy

## 2016-05-18 NOTE — Telephone Encounter (Signed)
Patient thought her appointment was at 11:15 today.  She missed her 10:15 appointment today. She was reminded of her 7:15 appointment tomorrow.  Liz BeachKaren harris PTA

## 2016-05-19 ENCOUNTER — Ambulatory Visit: Payer: BLUE CROSS/BLUE SHIELD | Admitting: Physical Therapy

## 2016-05-19 DIAGNOSIS — M6281 Muscle weakness (generalized): Secondary | ICD-10-CM | POA: Diagnosis not present

## 2016-05-19 DIAGNOSIS — R262 Difficulty in walking, not elsewhere classified: Secondary | ICD-10-CM

## 2016-05-19 DIAGNOSIS — M5440 Lumbago with sciatica, unspecified side: Secondary | ICD-10-CM

## 2016-05-19 DIAGNOSIS — M25562 Pain in left knee: Secondary | ICD-10-CM

## 2016-05-19 NOTE — Patient Instructions (Addendum)
  KNEE: Extension, Long Arc Quads - Sitting    Raise leg until knee is straight. PILLOW SQUEEZED BETWEEN KNEES  __20_ reps per set, _1__ sets per day, _7__ days per week  Copyright  VHI. All rights reserved.

## 2016-05-19 NOTE — Therapy (Signed)
[place], [place], [address] Phone: [phone]   Fax:  [phone]  Physical Therapy Treatment  Patient Details  Name: Natalie Chase MRN: [id number] Date of Birth: [birth year] Referring Provider: Dr. Lorin Mercy  Encounter Date: 05/19/2016      PT End of Session - 05/19/16 0741    Visit Number 10   Number of Visits 16   Date for PT Re-Evaluation 05/21/16   Authorization Type BCBS   PT Start Time 0717   PT Stop Time 0755   PT Time Calculation (min) 38 min      Past Medical History:  Diagnosis Date  . Asthma   . Hypertension   . Sickle cell trait ([place])   . Vaginal Pap smear, abnormal     Past Surgical History:  Procedure Laterality Date  . CHOLECYSTECTOMY      There were no vitals filed for this visit.      Subjective Assessment - 05/19/16 0719    Currently in Pain? Yes   Pain Score 4    Pain Location Back   Pain Orientation Left;Lower   Pain Descriptors / Indicators Aching   Aggravating Factors  standing, prolonged sitting>45 minutes, bending    Pain Relieving Factors gentle motions    Pain Score 3   Pain Location Knee   Pain Orientation Left   Pain Descriptors / Indicators --  weakness   Aggravating Factors  functional mobility, transitions    Pain Relieving Factors rest                          [facility] Adult PT Treatment/Exercise - 05/19/16 0001      Lumbar Exercises: Aerobic   Stationary Bike L5 5 minutes      Lumbar Exercises: Supine   Bridge 10 reps   Bridge Limitations with ball squeeze      Knee/Hip Exercises: Standing   Functional Squat 10 reps   Functional Squat Limitations then  10  reps with hip ER    Wall Squat 10 reps   Wall Squat Limitations then 10 reps with hip ER      Knee/Hip Exercises: Seated   Long Arc Quad 2 sets;10 reps   Long Arc Quad Limitations with ball squeeze 1 set, with ER and 4# second set    Sit to [employer] 10 reps  elevated seat  at first due to pain then after tape, without      Manual Therapy   Manual Therapy Taping   McConnell medial pull on Patella                 PT Education - 05/19/16 0802    Education provided Yes   Education Details HEP    Person(s) Educated Patient   Methods Explanation;Handout   Comprehension Verbalized understanding          PT Short Term Goals - 04/12/16 1333      PT SHORT TERM GOAL #1   Title Pt will be independent with her HEP   Time 4   Period Weeks   Status Achieved     PT SHORT TERM GOAL #2   Title Pt will be able to perform supine to sit, and sit to supine independently.    Time 4   Period Weeks   Status Achieved           PT Long Term Goals - 05/05/16 1106      PT  LONG TERM GOAL #1   Title pt will improve her left knee flexion to >/= 110 degrees actively in order to improve gait and transfers.    Status Achieved     PT LONG TERM GOAL #2   Title Pt will improve her lumbar flexion in order to pick an object off the floor safely using correct body mechanics.    Baseline able with hesitation and pulling sensation   Status Partially Met     PT LONG TERM GOAL #3   Title Pt will imporve Left LE strength to >/= 4/5 in order to improve gait and functional mobility.      PT LONG TERM GOAL #4   Title Pt will be able to perform sit to stand with no UE support independently in less than 5 seconds.    Baseline able to demo   Status Achieved     PT LONG TERM GOAL #5   Title Pt will be able to amb 30 minutes with step through gait pattern with no complaints of pain in order to perform routine grocery shopping.    Baseline able to walk 45 min-1 hour   Status Achieved               Plan - 05/19/16 0803    Clinical Impression Statement Pt reports limitation from knee pain/weakness. Her back pain is 4/10. Instructed pt in supine marching without increased pain if abdominals engaged. Attempted level 2/table top with increased pain and difficulty.  Closed chain knee strengthening was painful at medial knee. Applied mCconnels tape to assist patella tracking. Pt reports significant decrease in pain with closed chain exercises after tape applied.    PT Next Visit Plan [facility] squatting and mobility, stairs, lifting, Mcconnels tape for closed chain, target VMO, ERO Next    PT Home Exercise Plan PPT, quad sets, heel slides, hooklying ball squeeze, smoothe gait, scap retraction, ext rotation yellow tband, hamstring stretch seated, supine pec stretch, LAQ, bridge, standing hamstring curls    Consulted and Agree with Plan of Care Patient      Patient will benefit from skilled therapeutic intervention in order to improve the following deficits and impairments:  Pain, Postural dysfunction, Decreased mobility, Impaired perceived functional ability, Decreased strength, Decreased range of motion, Decreased activity tolerance, Difficulty walking, Decreased balance  Visit Diagnosis: Acute left-sided low back pain with sciatica, sciatica laterality unspecified  Difficulty in walking, not elsewhere classified  Muscle weakness (generalized)  Acute pain of left knee     Problem List Patient Active Problem List   Diagnosis Date Noted  . 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 ASCUS with positive high risk HPV cervical 10/29/2014    Dorene Ar, PTA 05/19/2016, 8:34 AM  [place], [place], [address] Phone: [phone]   Fax:  [phone]  Name: Natalie Chase MRN: [id number] Date of Birth: 11-12-1980

## 2016-05-23 DIAGNOSIS — F411 Generalized anxiety disorder: Secondary | ICD-10-CM | POA: Diagnosis not present

## 2016-05-24 ENCOUNTER — Ambulatory Visit: Payer: BLUE CROSS/BLUE SHIELD | Admitting: Physical Therapy

## 2016-05-24 ENCOUNTER — Encounter: Payer: Self-pay | Admitting: Physical Therapy

## 2016-05-24 DIAGNOSIS — M5440 Lumbago with sciatica, unspecified side: Secondary | ICD-10-CM

## 2016-05-24 DIAGNOSIS — M6281 Muscle weakness (generalized): Secondary | ICD-10-CM | POA: Diagnosis not present

## 2016-05-24 DIAGNOSIS — R262 Difficulty in walking, not elsewhere classified: Secondary | ICD-10-CM | POA: Diagnosis not present

## 2016-05-24 DIAGNOSIS — M25562 Pain in left knee: Secondary | ICD-10-CM
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
## 2016-05-25 ENCOUNTER — Ambulatory Visit: Payer: BLUE CROSS/BLUE SHIELD | Admitting: Physical Therapy

## 2016-05-25 ENCOUNTER — Encounter: Payer: Self-pay | Admitting: Physical Therapy

## 2016-05-25 DIAGNOSIS — R262 Difficulty in walking, not elsewhere classified: Secondary | ICD-10-CM

## 2016-05-25 DIAGNOSIS — M5440 Lumbago with sciatica, unspecified side: Secondary | ICD-10-CM | POA: Diagnosis not present

## 2016-05-25 DIAGNOSIS — M25562 Pain in left knee: Secondary | ICD-10-CM | POA: Diagnosis not present

## 2016-05-25 DIAGNOSIS — M6281 Muscle weakness (generalized): Secondary | ICD-10-CM | POA: Diagnosis not present

## 2016-05-25 NOTE — Therapy (Signed)
[facility] [address] [place], [place], [address] Phone: [phone]   Fax:  [phone]  Physical Therapy Treatment  Patient Details  Name: Natalie GinsShirley Chase MRN: [id number] Date of Birth: 05-02-1980 Referring Provider: Dr Ophelia CharterYates  Encounter Date: 05/25/2016      PT End of Session - 05/25/16 1502    Visit Number 12   Number of Visits 16   Date for PT Re-Evaluation 06/02/16   Authorization Type BCBS   PT Start Time 1502   PT Stop Time 1539   PT Time Calculation (min) 37 min   Activity Tolerance Patient tolerated treatment well   Behavior [facility] Therapy [facility] for tasks assessed/performed      Past Medical History:  Diagnosis Date  . Asthma   . Hypertension   . Sickle cell trait ([facility])   . Vaginal Pap smear, abnormal     Past Surgical History:  Procedure Laterality Date  . CHOLECYSTECTOMY      There were no vitals filed for this visit.      Subjective Assessment - 05/25/16 1502    Subjective Pt reports she is moving much better.    Currently in Pain? No/denies                         [facility] Treatment/Exercise - 05/25/16 0001      Lumbar Exercises: Supine   Other Supine Lumbar Exercises LE iso ADD with ball, +LE curl in to chest     Knee/Hip Exercises: Stretches   Passive Hamstring Stretch Limitations seated with green strap   Gastroc Stretch 30 seconds   Gastroc Stretch Limitations slant board   Soleus Stretch 30 seconds   Soleus Stretch Limitations slant board     Knee/Hip Exercises: Aerobic   Elliptical 5 min L1 ramp 10     Knee/Hip Exercises: Machines for Strengthening   Cybex Knee Extension 35lb   Cybex Knee Flexion 35lb   Cybex Leg Press 45lb     Knee/Hip Exercises: Seated   Sit to Sand Other (comment);without UE support  green tband     Knee/Hip Exercises: Supine   Bridges Limitations bridges with legs on green physioball                PT Education -  05/25/16 1504    Education provided Yes   Education Details exercise form/rationale   Person(s) Educated Patient   Methods Explanation;Demonstration;Tactile cues;Verbal cues   Comprehension Verbalized understanding;Returned demonstration;Verbal cues required;Tactile cues required;Need further instruction          PT Short Term Goals - 04/12/16 1333      PT SHORT TERM GOAL #1   Title Pt will be independent with her HEP   Time 4   Period Weeks   Status Achieved     PT SHORT TERM GOAL #2   Title Pt will be able to perform supine to sit, and sit to supine independently.    Time 4   Period Weeks   Status Achieved           PT Long Term Goals - 05/24/16 1157      PT LONG TERM GOAL #1   Title pt will improve her left knee flexion to >/= 110 degrees actively in order to improve gait and transfers.    Status Achieved     PT LONG TERM GOAL #2   Title Pt will improve her lumbar flexion in order to pick  an object off the floor safely using correct body mechanics.    Status Achieved     PT LONG TERM GOAL #3   Title Pt will imporve Left LE strength to >/= 4/5 in order to improve gait and functional mobility.    Status Achieved     PT LONG TERM GOAL #4   Title Pt will be able to perform sit to stand with no UE support independently in less than 5 seconds.    Status Achieved     PT LONG TERM GOAL #5   Title Pt will be able to amb 30 minutes with step through gait pattern with no complaints of pain in order to perform routine grocery shopping.    Status Achieved     Additional Long Term Goals   Additional Long Term Goals Yes     PT LONG TERM GOAL #6   Title Pt will be independent with HEP and verbalize comfort with gym program to continue strengthening following d/c by 4/6   Baseline will establish as appropriate   Time 2   Period Weeks   Status New               Plan - 05/25/16 1542    Clinical Impression Statement Pt was significantly fatigued by exercises  today but did not have any pain. Reviewed how to use machines that she would find in a gym and ways to do exercises using frequently available equipment   PT Next Visit Plan gym program   Consulted and Agree with Plan of Care Patient      Patient will benefit from skilled therapeutic intervention in order to improve the following deficits and impairments:     Visit Diagnosis: Acute left-sided low back pain with sciatica, sciatica laterality unspecified  Difficulty in walking, not elsewhere classified  Muscle weakness (generalized)  Acute pain of left knee     Problem List Patient Active Problem List   Diagnosis Date Noted  . 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 ASCUS with positive high risk HPV cervical 10/29/2014   Fayez Sturgell C. Iktan Aikman PT, DPT 05/25/16 3:44 PM   [facility] [address] [place], [place], [address] Phone: [phone]   Fax:  [phone]  Name: Natalie Chase MRN: [id number] Date of Birth: 03-07-1980

## 2016-06-01 ENCOUNTER — Encounter: Payer: BLUE CROSS/BLUE SHIELD | Admitting: Physical Therapy

## 2016-06-02 ENCOUNTER — Ambulatory Visit: Payer: BLUE CROSS/BLUE SHIELD | Attending: Orthopaedic Surgery | Admitting: Physical Therapy

## 2016-06-02 ENCOUNTER — Encounter: Payer: Self-pay | Admitting: Physical Therapy

## 2016-06-02 ENCOUNTER — Ambulatory Visit: Payer: BLUE CROSS/BLUE SHIELD | Admitting: Physical Therapy

## 2016-06-02 DIAGNOSIS — M5440 Lumbago with sciatica, unspecified side: Secondary | ICD-10-CM | POA: Diagnosis not present

## 2016-06-02 DIAGNOSIS — R262 Difficulty in walking, not elsewhere classified: Secondary | ICD-10-CM | POA: Insufficient documentation

## 2016-06-02 DIAGNOSIS — M25562 Pain in left knee: Secondary | ICD-10-CM | POA: Diagnosis not present

## 2016-06-02 DIAGNOSIS — M6281 Muscle weakness (generalized): Secondary | ICD-10-CM | POA: Insufficient documentation

## 2016-06-02 NOTE — Therapy (Signed)
[place], [place], [address] Phone: [phone]   Fax:  [phone]  Physical Therapy Treatment/Discharge Summary Patient Details  Name: Natalie Chase MRN: [id number] Date of Birth: 1980-03-18 Referring Provider: Dr Lorin Mercy  Encounter Date: 06/02/2016      PT End of Session - 06/02/16 0925    Visit Number 13   Number of Visits 16   Date for PT Re-Evaluation 06/02/16   Authorization Type BCBS   PT Start Time 0927   PT Stop Time 1003   PT Time Calculation (min) 36 min   Activity Tolerance Patient tolerated treatment well   Behavior During Therapy [facility] for tasks assessed/performed      Past Medical History:  Diagnosis Date  . Asthma   . Hypertension   . Sickle cell trait ([place])   . Vaginal Pap smear, abnormal     Past Surgical History:  Procedure Laterality Date  . CHOLECYSTECTOMY      There were no vitals filed for this visit.      Subjective Assessment - 06/02/16 0927    Subjective Pt reports she is feeling good.    How long can you sit comfortably? 1 hour   How long can you walk comfortably? 1 hour   Currently in Pain? No/denies            [facility] PT Assessment - 06/02/16 0001      Strength   Left Hip Flexion 4+/5   Left Hip Extension 4+/5   Left Hip ABduction 5/5   Left Knee Flexion 4+/5   Left Knee Extension 4+/5                     [facility] Adult PT Treatment/Exercise - 06/02/16 0001      Lumbar Exercises: Stretches   Double Knee to Chest Stretch Limitations reviewed for form   Lower Trunk Rotation Limitations reviewed for form     Lumbar Exercises: Aerobic   Elliptical 5 min   Tread Mill stepper 2 min     Lumbar Exercises: Machines for Strengthening   Cybex Lumbar Extension cues for posture   Cybex Knee Extension cues for posture   Leg Press upright and horizontal     Knee/Hip Exercises: Stretches   Passive Hamstring Stretch Limitations seated with green  strap   Hip Flexor Stretch Limitations thomas test position & prone green strap, standing   Gastroc Stretch 30 seconds   Gastroc Stretch Limitations slant board   Soleus Stretch 30 seconds   Soleus Stretch Limitations slant board                PT Education - 06/02/16 [phone]    Education provided Yes   Education Details exercise form/rationale, progression with goals, gym program, transition to group classes such as yoga, zumba and cycling.    Person(s) Educated Patient   Methods Explanation;Demonstration;Tactile cues;Verbal cues   Comprehension Verbalized understanding;Returned demonstration;Verbal cues required;Tactile cues required;Need further instruction          PT Short Term Goals - 04/12/16 1333      PT SHORT TERM GOAL #1   Title Pt will be independent with her HEP   Time 4   Period Weeks   Status Achieved     PT SHORT TERM GOAL #2   Title Pt will be able to perform supine to sit, and sit to supine independently.    Time 4   Period Weeks   Status Achieved  PT Long Term Goals - 06/02/16 0954      PT LONG TERM GOAL #1   Title pt will improve her left knee flexion to >/= 110 degrees actively in order to improve gait and transfers.    Baseline 110   Status Achieved     PT LONG TERM GOAL #2   Title Pt will improve her lumbar flexion in order to pick an object off the floor safely using correct body mechanics.    Baseline able with hesitation and pulling sensation   Status Achieved     PT LONG TERM GOAL #3   Title Pt will imporve Left LE strength to >/= 4/5 in order to improve gait and functional mobility.    Baseline see flowsheet   Status Achieved     PT LONG TERM GOAL #4   Title Pt will be able to perform sit to stand with no UE support independently in less than 5 seconds.    Baseline able to demo   Status Achieved     PT LONG TERM GOAL #5   Title Pt will be able to amb 30 minutes with step through gait pattern with no complaints of  pain in order to perform routine grocery shopping.    Baseline able 1 hour   Status Achieved     PT LONG TERM GOAL #6   Title Pt will be independent with HEP and verbalize comfort with gym program to continue strengthening following d/c by 4/6   Baseline verbalized and demo   Status Achieved               Plan - 06/02/16 1004    Clinical Impression Statement Pt was able to demo proper use of available gym equipment as well as stretches. Pt verbalized readiness for d/c to independent gym program and has met all goals. Was instructed to contact us with any further questions.    PT Treatment/Interventions ADLs/Self Care Home Management;Stair training;Functional mobility training;Therapeutic activities;Therapeutic exercise;Balance training;Neuromuscular re-education;Manual techniques;Patient/family education;Passive range of motion;Dry needling;Taping;Cryotherapy;Electrical Stimulation;Iontophoresis 45m/ml Dexamethasone;Moist Heat;Gait training   Consulted and Agree with Plan of Care Patient      Patient will benefit from skilled therapeutic intervention in order to improve the following deficits and impairments:  Pain, Postural dysfunction, Decreased mobility, Impaired perceived functional ability, Decreased strength, Decreased range of motion, Decreased activity tolerance, Difficulty walking, Decreased balance  Visit Diagnosis: Acute left-sided low back pain with sciatica, sciatica laterality unspecified  Difficulty in walking, not elsewhere classified  Muscle weakness (generalized)  Acute pain of left knee     Problem List Patient Active Problem List   Diagnosis Date Noted  . 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 ASCUS with positive high risk HPV cervical 10/29/2014    PHYSICAL THERAPY DISCHARGE SUMMARY  Visits from Start of Care: 13  Current functional level related to goals / functional outcomes: See above   Remaining deficits: See above    Education / Equipment: Anatomy of condition, POC, HEP, exercise form/rationale  Plan: Patient agrees to discharge.  Patient goals were met. Patient is being discharged due to meeting the stated rehab goals.  ?????   Mickel Schreur C. Mikiyah Glasner PT, DPT 06/02/16 10:07 AM        [place] [address]Phone: [phone]  Fax:  [phone] Name: Natalie SlatteryMRN: [id number] of Birth: 5May 01, 1982

## 2016-06-08 ENCOUNTER — Ambulatory Visit: Payer: BLUE CROSS/BLUE SHIELD | Admitting: Physical Therapy

## 2016-06-08 ENCOUNTER — Telehealth (INDEPENDENT_AMBULATORY_CARE_PROVIDER_SITE_OTHER): Payer: Self-pay | Admitting: Orthopaedic Surgery

## 2016-06-08 NOTE — Telephone Encounter (Signed)
Ok FIX NOTE FOR REGULAR DUTY WORK. THANKS

## 2016-06-08 NOTE — Telephone Encounter (Signed)
Patient called asking for a letter to be sent to her house from Dr. Ophelia Charter stating that she is able to return to work. She completed her last therapy session and is feeling a lot better. CB # [phone]

## 2016-06-08 NOTE — Telephone Encounter (Signed)
Ok for letter

## 2016-06-08 NOTE — Telephone Encounter (Signed)
Note fixed. Patient will pick up in [facility] office.

## 2016-06-09 ENCOUNTER — Encounter: Payer: Self-pay | Admitting: Physical Therapy

## 2016-07-28 DIAGNOSIS — M2550 Pain in unspecified joint: Secondary | ICD-10-CM | POA: Diagnosis not present

## 2016-07-28 DIAGNOSIS — M549 Dorsalgia, unspecified: Secondary | ICD-10-CM | POA: Diagnosis not present

## 2016-07-28 DIAGNOSIS — R52 Pain, unspecified: Secondary | ICD-10-CM | POA: Diagnosis not present

## 2016-08-07 ENCOUNTER — Telehealth (INDEPENDENT_AMBULATORY_CARE_PROVIDER_SITE_OTHER): Payer: Self-pay | Admitting: Orthopaedic Surgery

## 2016-08-07 NOTE — Telephone Encounter (Signed)
Unable to process request for records, the authorization was not dated, therefore, invalid.  I returned to patient.

## 2016-08-08 NOTE — Telephone Encounter (Signed)
Patient called to check status of her request for records. I informed her that I was unable to process it because her signature was not dated. She said she would come by the office today to complete a new form. I went ahead and copied records and have ready for her

## 2016-08-15 DIAGNOSIS — J45909 Unspecified asthma, uncomplicated: Secondary | ICD-10-CM | POA: Diagnosis not present

## 2016-08-15 DIAGNOSIS — E669 Obesity, unspecified: Secondary | ICD-10-CM | POA: Diagnosis not present

## 2016-08-15 DIAGNOSIS — I10 Essential (primary) hypertension: Secondary | ICD-10-CM | POA: Diagnosis not present

## 2016-08-29 ENCOUNTER — Ambulatory Visit (INDEPENDENT_AMBULATORY_CARE_PROVIDER_SITE_OTHER): Payer: BLUE CROSS/BLUE SHIELD | Admitting: Physician Assistant

## 2016-08-29 ENCOUNTER — Encounter (INDEPENDENT_AMBULATORY_CARE_PROVIDER_SITE_OTHER): Payer: Self-pay | Admitting: Physician Assistant

## 2016-08-29 VITALS — BP 130/79 | HR 86 | Temp 99.7°F | Resp 18 | Ht 65.0 in | Wt 204.0 lb

## 2016-08-29 DIAGNOSIS — L83 Acanthosis nigricans: Secondary | ICD-10-CM | POA: Diagnosis not present

## 2016-08-29 DIAGNOSIS — M2550 Pain in unspecified joint: Secondary | ICD-10-CM

## 2016-08-29 DIAGNOSIS — Z79899 Other long term (current) drug therapy: Secondary | ICD-10-CM | POA: Diagnosis not present

## 2016-08-29 DIAGNOSIS — I10 Essential (primary) hypertension: Secondary | ICD-10-CM

## 2016-08-29 DIAGNOSIS — R7303 Prediabetes: Secondary | ICD-10-CM | POA: Diagnosis not present

## 2016-08-29 DIAGNOSIS — D649 Anemia, unspecified: Secondary | ICD-10-CM

## 2016-08-29 LAB — POCT GLYCOSYLATED HEMOGLOBIN (HGB A1C): HEMOGLOBIN A1C: 6

## 2016-08-29 MED ORDER — DOCUSATE SODIUM 50 MG PO CAPS: 50.0000 mg | ORAL_CAPSULE | Freq: Two times a day (BID) | ORAL | 2 refills | Status: DC

## 2016-08-29 MED ORDER — FERROUS SULFATE 325 (65 FE) MG PO TABS: 325.0000 mg | ORAL_TABLET | Freq: Two times a day (BID) | ORAL | 0 refills | Status: DC

## 2016-08-29 NOTE — Patient Instructions (Signed)
Scleroderma Scleroderma is a rare and long-term (chronic) disease of your immune system. Your immune system is the part of your body that protects you from getting sick. If you have scleroderma, your immune system may attack your skin and other parts of your body instead (autoimmune disease). Scleroderma means hardening of the skin. If you have a mild form, it may affect only your skin (localized scleroderma). Scleroderma can also affect your blood vessels, lungs, kidneys, heart, and digestive system (systemic scleroderma). What are the causes? The cause of scleroderma is not known. What increases the risk? You may be at higher risk for scleroderma if:  You have a family history of scleroderma.  You have another autoimmune disease, especially lupus.  You are female.  You are 30-50 years old.  What are the signs or symptoms? Signs and symptoms of scleroderma depend on the type of scleroderma you have and vary from person to person. Exposure to cold often triggers symptoms of the condition. Early symptoms may include:  Discoloration of the fingers and sometimes toes. Your fingers or toes may turn blue, white, or red.  Numbness or pain in your fingers or toes.  Sores and loss of blood supply (gangrene) in the fingers and toes.  Signs and symptoms of localized scleroderma include:  Discolored patches of skin (morphea). These may be thick and waxy.  Bands of thick, hard skin on your arms, legs, or face (linear scleroderma).  Signs and symptoms of systemic scleroderma include:  Trouble swallowing (dysphagia).  Enlarged blood vessels of the hands, face, and nail beds (telangiectasias).  Calcium deposits under the skin (calcinosis).  High blood pressure.  Heartburn.  Constipation.  Trouble breathing.  Joint pain.  Fatigue and weakness.  How is this diagnosed? Your health care provider will make a diagnosis based on your symptoms and medical history. Your health care  provider will also do a physical exam. This may include tests, such as:  Blood tests.  X-rays.  Lung (pulmonary) function tests.  Scleroderma can be hard to diagnose because other diseases have many of the same symptoms. You may need to see further specialists as directed by your health care provider. How is this treated? There is no known cure for scleroderma. Treatment focuses on relieving symptoms and preventing complications. This may include taking medicine to:  Improve blood flow.  Block production of stomach acid to treat heartburn.  Treat high blood pressure caused by kidney disease.  Relieve joint pain and inflammation.  Treat lung symptoms.  Mild symptoms may not need treatment. Follow these instructions at home:  Learn as much as you can about scleroderma and work closely with your team of health care providers.  Take medicines only as directed by your health care provider.  Do not use any tobacco products including cigarettes, chewing tobacco, or electronic cigarettes. If you need help quitting, ask your health care provider.  Keep your hands and feet warm and protected.  Dress in layers for the cold.  Wear comfortable shoes that are well cushioned.  Stretch and exercise regularly.  Maintain a healthy weight.  Eat a healthy diet.  Eat smaller meals often.  Avoid spicy and fatty foods.  Do not eat meals late in the evening.  Protect your skin with sunscreen and moisturizers.  Keep the head of your bed slightly elevated.  Ask your health care provider how to check your blood pressure at home and check it as directed by your health care provider.  Make sure you have   a good support system at home. Contact a health care provider if:  Your scleroderma symptoms change or become worse.  You feel short of breath.  You have trouble swallowing.  You are struggling with depression or anxiety. Get help right away if:  You have trouble  breathing.  You have chest pain.  A finger or toe becomes painful or numb.  A finger or toe turns a very dark color. This information is not intended to replace advice given to you by your health care provider. Make sure you discuss any questions you have with your health care provider. Document Released: 05/06/2002 Document Revised: 07/22/2015 Document Reviewed: 04/18/2013 Elsevier Interactive Patient Education  2018 [employer]  

## 2016-08-29 NOTE — Progress Notes (Signed)
Subjective:  Patient ID: Natalie Chase, female    DOB: 25-Dec-1980  Age: 36 y.o. MRN: [id number]  CC: establish care  HPI Natalie Chase is a 36 y.o. female with a PMH of asthma, HTN, sickle cell trait, and abnormal PAP presents with multiple complaints. Has concern with joint pains at the medial knee and elbow bilaterally. Also experiences pain and swelling of a few fingers. ANA positive last month with a nucleolar pattern associated to scleroderma, systemic sclerosis/polymyositis, and Sjogren's. Has been taking tapering prednisone doses. Has an appointment with [facility] Rheumatology at 1430 hrs on September 06, 2016. Other recent testing revealed TSH 1.38, CMP normal, and CBC 11.0.    Has acanthosis nigricans but is unsure if she is diabetic. Has not had an A1c done. Does not endorse polydipsia, polyuria, polyphagia, tingling, numbness, or visual blurring.   Outpatient Medications Prior to Visit  Medication Sig Dispense Refill  . albuterol (PROVENTIL HFA;VENTOLIN HFA) 108 (90 Base) MCG/ACT inhaler Inhale 1-2 puffs into the lungs every 6 (six) hours as needed for wheezing or shortness of breath. 1 Inhaler 0  . amLODipine (NORVASC) 10 MG tablet Take 1 tablet (10 mg total) by mouth daily. 30 tablet 1  . cholecalciferol (VITAMIN D) 1000 UNITS tablet Take 2,000 Units by mouth daily.    . cyclobenzaprine (FLEXERIL) 10 MG tablet Take 1 tablet (10 mg total) by mouth 2 (two) times daily as needed for muscle spasms. 20 tablet 0  . hydrochlorothiazide (HYDRODIURIL) 25 MG tablet Take 25 mg by mouth daily.    . metoprolol tartrate (LOPRESSOR) 25 MG tablet Take 25 mg by mouth 2 (two) times daily. Pt unsure of dose    . predniSONE (DELTASONE) 50 MG tablet Take 1 tablet once a day for 5 days 5 tablet 0   No facility-administered medications prior to visit.      ROS Review of Systems  Constitutional: Negative for chills, fever and malaise/fatigue.  Eyes: Negative for blurred vision.  Respiratory: Negative  for shortness of breath.   Cardiovascular: Negative for chest pain and palpitations.  Gastrointestinal: Negative for abdominal pain and nausea.  Genitourinary: Negative for dysuria and hematuria.  Musculoskeletal: Positive for joint pain. Negative for myalgias.  Skin: Negative for rash.  Neurological: Negative for tingling and headaches.  Psychiatric/Behavioral: Negative for depression. The patient is not nervous/anxious.     Objective:  BP 130/79 (BP Location: Left Arm, Patient Position: Sitting, Cuff Size: Normal)   Pulse 86   Temp 99.7 F (37.6 C) (Oral)   Resp 18   Ht 5\' 5"  (1.651 m)   Wt 204 lb (92.5 kg)   LMP 08/24/2016   SpO2 100%   BMI 33.95 kg/m   BP/Weight 08/29/2016 04/10/2016 03/10/2016  Systolic BP 130 128 154  Diastolic BP 79 92 98  Wt. (Lbs) 204 209 210  BMI 33.95 34.78 34.95      Physical Exam  Constitutional: She is oriented to person, place, and time.  Well developed, well nourished, NAD, polite  HENT:  Head: Normocephalic and atraumatic.  Eyes: No scleral icterus.  Neck: Normal range of motion. Neck supple. No thyromegaly present.  Cardiovascular: Normal rate, regular rhythm and normal heart sounds.   Pulmonary/Chest: Effort normal and breath sounds normal.  Musculoskeletal: She exhibits no edema or deformity.  TTP of the medial knee and elbow bilaterally  Neurological: She is alert and oriented to person, place, and time.  Skin: Skin is warm and dry. No rash noted. No erythema. No pallor.  Acanthosis nigricans  Psychiatric: She has a normal mood and affect. Her behavior is normal. Thought content normal.  Vitals reviewed.    Assessment & Plan:   1. Hypertension, unspecified type - In control today at 130/79 mmHg, however I have advised her that she should optimally be at/around 115/75. I instructed for her to monitor BP and report any consistently high numbers.  2. Anemia, unspecified type - CBC with Differential - Begin ferrous sulfate 325  (65 FE) MG tablet; Take 1 tablet (325 mg total) by mouth 2 (two) times daily with a meal.  Dispense: 60 tablet; Refill: 0 - Begin docusate sodium (COLACE) 50 MG capsule for iron induced constipation  3. Arthralgia of multiple sites - DG Chest 2 View; Future in anticipation of immunosuppressant use at rheumatology.  4. Acanthosis nigricans - A1c 6.0% in clinic today.  5. Prediabetes - A1c 6.0% in clinic today.  6. High risk medication use - Begin ferrous sulfate 325 (65 FE) MG tablet; Take 1 tablet (325 mg total) by mouth 2 (two) times daily with a meal.  Dispense: 60 tablet; Refill: 0 - Begin docusate sodium (COLACE) 50 MG capsule; Take 1 capsule (50 mg total) by mouth 2 (two) times daily.  Dispense: 10 capsule; Refill: 2     Meds ordered this encounter  Medications  . ferrous sulfate 325 (65 FE) MG tablet    Sig: Take 1 tablet (325 mg total) by mouth 2 (two) times daily with a meal.    Dispense:  60 tablet    Refill:  0    Order Specific Question:   Supervising Provider    Answer:   Quentin AngstJEGEDE, OLUGBEMIGA E [id number]  . docusate sodium (COLACE) 50 MG capsule    Sig: Take 1 capsule (50 mg total) by mouth 2 (two) times daily.    Dispense:  10 capsule    Refill:  2    Order Specific Question:   Supervising Provider    Answer:   Quentin AngstJEGEDE, OLUGBEMIGA E [[id number]]    Follow-up: No Follow-up on file.   Loletta Specteroger David Gomez PA

## 2016-08-30 LAB — CBC WITH DIFFERENTIAL/PLATELET
BASOS: 0 %
Basophils Absolute: 0 10*3/uL (ref 0.0–0.2)
EOS (ABSOLUTE): 0.2 10*3/uL (ref 0.0–0.4)
EOS: 1 %
HEMATOCRIT: 38.5 % (ref 34.0–46.6)
HEMOGLOBIN: 12.4 g/dL (ref 11.1–15.9)
IMMATURE GRANS (ABS): 0 10*3/uL (ref 0.0–0.1)
IMMATURE GRANULOCYTES: 0 %
LYMPHS: 22 %
Lymphocytes Absolute: 4.5 10*3/uL — ABNORMAL HIGH (ref 0.7–3.1)
MCH: 24.6 pg — ABNORMAL LOW (ref 26.6–33.0)
MCHC: 32.2 g/dL (ref 31.5–35.7)
MCV: 76 fL — AB (ref 79–97)
Monocytes Absolute: 0.8 10*3/uL (ref 0.1–0.9)
Monocytes: 4 %
NEUTROS ABS: 15 10*3/uL — AB (ref 1.4–7.0)
Neutrophils: 73 %
Platelets: 459 10*3/uL — ABNORMAL HIGH (ref 150–379)
RBC: 5.05 x10E6/uL (ref 3.77–5.28)
RDW: 16.8 % — ABNORMAL HIGH (ref 12.3–15.4)
WBC: 20.5 10*3/uL (ref 3.4–10.8)

## 2016-09-06 DIAGNOSIS — M79642 Pain in left hand: Secondary | ICD-10-CM | POA: Diagnosis not present

## 2016-09-06 DIAGNOSIS — R890 Abnormal level of enzymes in specimens from other organs, systems and tissues: Secondary | ICD-10-CM | POA: Diagnosis not present

## 2016-09-06 DIAGNOSIS — D500 Iron deficiency anemia secondary to blood loss (chronic): Secondary | ICD-10-CM | POA: Diagnosis not present

## 2016-09-06 DIAGNOSIS — M79671 Pain in right foot: Secondary | ICD-10-CM | POA: Diagnosis not present

## 2016-09-06 DIAGNOSIS — M2550 Pain in unspecified joint: Secondary | ICD-10-CM | POA: Diagnosis not present

## 2016-09-06 DIAGNOSIS — M79672 Pain in left foot: Secondary | ICD-10-CM | POA: Diagnosis not present

## 2016-09-06 DIAGNOSIS — R5383 Other fatigue: Secondary | ICD-10-CM | POA: Diagnosis not present

## 2016-09-06 DIAGNOSIS — M79641 Pain in right hand: Secondary | ICD-10-CM | POA: Diagnosis not present

## 2016-09-06 DIAGNOSIS — R5382 Chronic fatigue, unspecified: Secondary | ICD-10-CM | POA: Diagnosis not present

## 2016-09-06 DIAGNOSIS — R768 Other specified abnormal immunological findings in serum: Secondary | ICD-10-CM | POA: Diagnosis not present

## 2016-09-06 DIAGNOSIS — D649 Anemia, unspecified: Secondary | ICD-10-CM | POA: Diagnosis not present

## 2016-10-09 ENCOUNTER — Other Ambulatory Visit (INDEPENDENT_AMBULATORY_CARE_PROVIDER_SITE_OTHER): Payer: Self-pay | Admitting: Physician Assistant

## 2016-10-09 DIAGNOSIS — D649 Anemia, unspecified: Secondary | ICD-10-CM

## 2016-10-09 DIAGNOSIS — Z79899 Other long term (current) drug therapy: Secondary | ICD-10-CM

## 2016-10-09 NOTE — Telephone Encounter (Signed)
FWD to PCP. Tempestt S Roberts, CMA  

## 2016-10-11 DIAGNOSIS — M25462 Effusion, left knee: Secondary | ICD-10-CM | POA: Diagnosis not present

## 2016-10-11 DIAGNOSIS — M25422 Effusion, left elbow: Secondary | ICD-10-CM | POA: Diagnosis not present

## 2016-10-11 DIAGNOSIS — M25421 Effusion, right elbow: Secondary | ICD-10-CM | POA: Diagnosis not present

## 2016-10-11 DIAGNOSIS — M359 Systemic involvement of connective tissue, unspecified: Secondary | ICD-10-CM | POA: Diagnosis not present

## 2016-10-11 DIAGNOSIS — M1990 Unspecified osteoarthritis, unspecified site: Secondary | ICD-10-CM | POA: Diagnosis not present

## 2016-10-11 DIAGNOSIS — Z79899 Other long term (current) drug therapy: Secondary | ICD-10-CM | POA: Diagnosis not present

## 2016-10-11 DIAGNOSIS — R809 Proteinuria, unspecified: Secondary | ICD-10-CM | POA: Diagnosis not present

## 2016-10-11 DIAGNOSIS — M25461 Effusion, right knee: Secondary | ICD-10-CM | POA: Diagnosis not present

## 2016-10-11 DIAGNOSIS — F17210 Nicotine dependence, cigarettes, uncomplicated: Secondary | ICD-10-CM | POA: Diagnosis not present

## 2016-10-11 DIAGNOSIS — M2550 Pain in unspecified joint: Secondary | ICD-10-CM | POA: Diagnosis not present

## 2016-10-31 ENCOUNTER — Ambulatory Visit (INDEPENDENT_AMBULATORY_CARE_PROVIDER_SITE_OTHER): Payer: BLUE CROSS/BLUE SHIELD | Admitting: Physician Assistant

## 2016-11-07 ENCOUNTER — Ambulatory Visit (INDEPENDENT_AMBULATORY_CARE_PROVIDER_SITE_OTHER): Payer: BLUE CROSS/BLUE SHIELD | Admitting: Physician Assistant

## 2016-11-07 ENCOUNTER — Encounter (INDEPENDENT_AMBULATORY_CARE_PROVIDER_SITE_OTHER): Payer: Self-pay | Admitting: Physician Assistant

## 2016-11-07 VITALS — BP 101/69 | HR 97 | Temp 99.3°F | Resp 18 | Ht 65.0 in | Wt 199.0 lb

## 2016-11-07 DIAGNOSIS — Z79899 Other long term (current) drug therapy: Secondary | ICD-10-CM

## 2016-11-07 DIAGNOSIS — R5383 Other fatigue: Secondary | ICD-10-CM

## 2016-11-07 DIAGNOSIS — M1990 Unspecified osteoarthritis, unspecified site: Secondary | ICD-10-CM | POA: Diagnosis not present

## 2016-11-07 NOTE — Progress Notes (Signed)
Subjective:  Patient ID: Natalie Chase, female    DOB: 1981/01/11  Age: 36 y.o. MRN: [id number]  CC: f/u  HPI Natalie Chase is a 36 y.o. female with a medical history of asthma and HTN presents on f/u arthralgias. She went to rheumatology on 10/11/16, diagnosed with inflammatory arthritis. Still has no definitive diagnosis but is still undergoing work up. Patient is interesting in conceiving a child but puts priority in alleviating her pain. Taking Plaquenil with mild to moderate reduction of pain. Says prednisone is much more helpful in reducing her pain. Endorses fatigue. Does not endorse any other symptom or complaint.      Outpatient Medications Prior to Visit  Medication Sig Dispense Refill  . albuterol (PROVENTIL HFA;VENTOLIN HFA) 108 (90 Base) MCG/ACT inhaler Inhale 1-2 puffs into the lungs every 6 (six) hours as needed for wheezing or shortness of breath. 1 Inhaler 0  . amLODipine (NORVASC) 10 MG tablet Take 1 tablet (10 mg total) by mouth daily. 30 tablet 1  . cholecalciferol (VITAMIN D) 1000 UNITS tablet Take 2,000 Units by mouth daily.    Marland Kitchen. docusate sodium (COLACE) 50 MG capsule Take 1 capsule (50 mg total) by mouth 2 (two) times daily. 10 capsule 2  . ferrous sulfate 325 (65 FE) MG tablet TAKE 1 TABLET BY MOUTH TWICE DAILY WITH  A  MEAL 60 tablet 0  . hydrochlorothiazide (HYDRODIURIL) 25 MG tablet Take 25 mg by mouth daily.    . metoprolol tartrate (LOPRESSOR) 25 MG tablet Take 25 mg by mouth 2 (two) times daily. Pt unsure of dose    . cyclobenzaprine (FLEXERIL) 10 MG tablet Take 1 tablet (10 mg total) by mouth 2 (two) times daily as needed for muscle spasms. 20 tablet 0   No facility-administered medications prior to visit.      ROS Review of Systems  Constitutional: Negative for chills, fever and malaise/fatigue.  Eyes: Negative for blurred vision.  Respiratory: Negative for shortness of breath.   Cardiovascular: Negative for chest pain and palpitations.   Gastrointestinal: Negative for abdominal pain and nausea.  Genitourinary: Negative for dysuria and hematuria.  Musculoskeletal: Positive for joint pain. Negative for myalgias.  Skin: Negative for rash.  Neurological: Negative for tingling and headaches.  Psychiatric/Behavioral: Negative for depression. The patient is not nervous/anxious.     Objective:  BP 101/69 (BP Location: Left Arm, Patient Position: Sitting, Cuff Size: Large)   Pulse 97   Temp 99.3 F (37.4 C) (Oral)   Resp 18   Ht 5\' 5"  (1.651 m)   Wt 199 lb (90.3 kg)   LMP 10/11/2016   SpO2 99%   BMI 33.12 kg/m   BP/Weight 11/07/2016 08/29/2016 04/10/2016  Systolic BP 101 130 128  Diastolic BP 69 79 92  Wt. (Lbs) 199 204 209  BMI 33.12 33.95 34.78      Physical Exam  Constitutional: She is oriented to person, place, and time.  Well developed, obese, NAD, polite  HENT:  Head: Normocephalic and atraumatic.  Eyes: No scleral icterus.  Cardiovascular: Normal rate, regular rhythm and normal heart sounds.   Pulmonary/Chest: Effort normal and breath sounds normal.  Musculoskeletal: She exhibits no edema.  Neurological: She is alert and oriented to person, place, and time. No cranial nerve deficit. Coordination normal.  Skin: Skin is warm and dry. No rash noted. No erythema. No pallor.  Psychiatric: She has a normal mood and affect. Her behavior is normal. Thought content normal.  Vitals reviewed.    Assessment &  Plan:    1. Inflammatory arthritis - Comprehensive metabolic panel  2. Fatigue, unspecified type - Comprehensive metabolic panel  3. High risk medication use - Comprehensive metabolic panel     Follow-up: PRN, Return for flu vaccine.  Loletta Specter PA

## 2016-11-07 NOTE — Patient Instructions (Signed)

## 2016-11-08 ENCOUNTER — Other Ambulatory Visit (INDEPENDENT_AMBULATORY_CARE_PROVIDER_SITE_OTHER): Payer: Self-pay | Admitting: Physician Assistant

## 2016-11-08 DIAGNOSIS — Z79899 Other long term (current) drug therapy: Secondary | ICD-10-CM

## 2016-11-08 DIAGNOSIS — D649 Anemia, unspecified: Secondary | ICD-10-CM

## 2016-11-08 LAB — COMPREHENSIVE METABOLIC PANEL
A/G RATIO: 1.6 (ref 1.2–2.2)
ALT: 12 IU/L (ref 0–32)
AST: 14 IU/L (ref 0–40)
Albumin: 4.8 g/dL (ref 3.5–5.5)
Alkaline Phosphatase: 72 IU/L (ref 39–117)
BILIRUBIN TOTAL: 0.3 mg/dL (ref 0.0–1.2)
BUN/Creatinine Ratio: 10 (ref 9–23)
BUN: 9 mg/dL (ref 6–20)
CHLORIDE: 95 mmol/L — AB (ref 96–106)
CO2: 23 mmol/L (ref 20–29)
Calcium: 10 mg/dL (ref 8.7–10.2)
Creatinine, Ser: 0.86 mg/dL (ref 0.57–1.00)
GFR calc non Af Amer: 88 mL/min/{1.73_m2} (ref >59)
GFR, EST AFRICAN AMERICAN: 102 mL/min/{1.73_m2} (ref >59)
Globulin, Total: 3 g/dL (ref 1.5–4.5)
Glucose: 82 mg/dL (ref 65–99)
POTASSIUM: 3.5 mmol/L (ref 3.5–5.2)
Sodium: 140 mmol/L (ref 134–144)
TOTAL PROTEIN: 7.8 g/dL (ref 6.0–8.5)

## 2016-11-08 NOTE — Telephone Encounter (Signed)
FWD to PCP. Tempestt S Roberts, CMA  

## 2016-12-14 ENCOUNTER — Encounter: Payer: Self-pay | Admitting: Obstetrics & Gynecology

## 2016-12-14 ENCOUNTER — Ambulatory Visit (INDEPENDENT_AMBULATORY_CARE_PROVIDER_SITE_OTHER): Payer: BLUE CROSS/BLUE SHIELD | Admitting: Obstetrics & Gynecology

## 2016-12-14 VITALS — BP 119/80 | HR 104 | Ht 65.0 in | Wt 197.0 lb

## 2016-12-14 DIAGNOSIS — Z113 Encounter for screening for infections with a predominantly sexual mode of transmission: Secondary | ICD-10-CM | POA: Diagnosis not present

## 2016-12-14 NOTE — Progress Notes (Signed)
Subjective:     Patient ID: Natalie Chase, female   DOB: Feb 04, 1981, 36 y.o.   MRN: [id number] Cc: requests STD screen  HPI [id number] Patient's last menstrual period was 12/11/2016 (approximate). She has not conceived in 14 years and has appointment with infertility specialist soon. She wants STD screen. We discussed her negative result from 03/2016.  Past Medical History:  Diagnosis Date  . Asthma   . Hypertension   . Sickle cell trait (HCC)   . Vaginal Pap smear, abnormal    Past Surgical History:  Procedure Laterality Date  . CHOLECYSTECTOMY     No Known Allergies  Family History  Problem Relation Age of Onset  . Achondroplasia Mother   . Lupus Mother   . Sickle cell anemia Mother   . Cancer Other    Social History   Social History  . Marital status: Single    Spouse name: N/A  . Number of children: N/A  . Years of education: N/A   Occupational History  . Not on file.   Social History Main Topics  . Smoking status: Current Some Day Smoker    Packs/day: 0.25    Types: Cigarettes  . Smokeless tobacco: Former NeurosurgeonUser  . Alcohol use Yes  . Drug use: Yes    Types: Marijuana  . Sexual activity: Yes    Birth control/ protection: None   Other Topics Concern  . Not on file   Social History Narrative  . No narrative on file    Review of Systems  Constitutional: Negative.   Gastrointestinal: Negative.   Endocrine: Negative.   Genitourinary: Negative.        Objective:   Physical Exam  Constitutional: She is oriented to person, place, and time. She appears well-developed. No distress.  Pulmonary/Chest: Effort normal.  Neurological: She is alert and oriented to person, place, and time.  Psychiatric: She has a normal mood and affect. Her behavior is normal.  Vitals reviewed.  Labs and pap 03/2016 reviewed    Assessment:     Infertility for 14 years plans consultation  Requests STD screening    Plan:     Orders Placed This Encounter  Procedures  . RPR  .  HIV antibody  . Hepatitis C antibody  . Hepatitis B surface antigen   F/U with [facility] Pap repeat 03/2017 Records release 15 min face to face and coordination of care  Adam PhenixArnold, Xitlally Mooneyham G, MD

## 2016-12-14 NOTE — Patient Instructions (Signed)
Sexually Transmitted Disease  A sexually transmitted disease (STD) is a disease or infection that may be passed (transmitted) from person to person, usually during sexual activity. This may happen by way of saliva, semen, blood, vaginal mucus, or urine. Common STDs include:   Gonorrhea.   Chlamydia.   Syphilis.   HIV and AIDS.   Genital herpes.   Hepatitis B and C.   Trichomonas.   Human papillomavirus (HPV).   Pubic lice.   Scabies.   Mites.   Bacterial vaginosis.    What are the causes?  An STD may be caused by bacteria, a virus, or parasites. STDs are often transmitted during sexual activity if one person is infected. However, they may also be transmitted through nonsexual means. STDs may be transmitted after:   Sexual intercourse with an infected person.   Sharing sex toys with an infected person.   Sharing needles with an infected person or using unclean piercing or tattoo needles.   Having intimate contact with the genitals, mouth, or rectal areas of an infected person.   Exposure to infected fluids during birth.    What are the signs or symptoms?  Different STDs have different symptoms. Some people may not have any symptoms. If symptoms are present, they may include:   Painful or bloody urination.   Pain in the pelvis, abdomen, vagina, anus, throat, or eyes.   A skin rash, itching, or irritation.   Growths, ulcerations, blisters, or sores in the genital and anal areas.   Abnormal vaginal discharge with or without bad odor.   Penile discharge in men.   Fever.   Pain or bleeding during sexual intercourse.   Swollen glands in the groin area.   Yellow skin and eyes (jaundice). This is seen with hepatitis.   Swollen testicles.   Infertility.   Sores and blisters in the mouth.    How is this diagnosed?  To make a diagnosis, your health care provider may:   Take a medical history.   Perform a physical exam.   Take a sample of any discharge to examine.   Swab the throat, cervix,  opening to the penis, rectum, or vagina for testing.   Test a sample of your first morning urine.   Perform blood tests.   Perform a Pap test, if this applies.   Perform a colposcopy.   Perform a laparoscopy.    How is this treated?  Treatment depends on the STD. Some STDs may be treated but not cured.   Chlamydia, gonorrhea, trichomonas, and syphilis can be cured with antibiotic medicine.   Genital herpes, hepatitis, and HIV can be treated, but not cured, with prescribed medicines. The medicines lessen symptoms.   Genital warts from HPV can be treated with medicine or by freezing, burning (electrocautery), or surgery. Warts may come back.   HPV cannot be cured with medicine or surgery. However, abnormal areas may be removed from the cervix, vagina, or vulva.   If your diagnosis is confirmed, your recent sexual partners need treatment. This is true even if they are symptom-free or have a negative culture or evaluation. They should not have sex until their health care providers say it is okay.   Your health care provider may test you for infection again 3 months after treatment.    How is this prevented?  Take these steps to reduce your risk of getting an STD:   Use latex condoms, dental dams, and water-soluble lubricants during sexual activity. Do not use   petroleum jelly or oils.   Avoid having multiple sex partners.   Do not have sex with someone who has other sex partners.   Do not have sex with anyone you do not know or who is at high risk for an STD.   Avoid risky sex practices that can break your skin.   Do not have sex if you have open sores on your mouth or skin.   Avoid drinking too much alcohol or taking illegal drugs. Alcohol and drugs can affect your judgment and put you in a vulnerable position.   Avoid engaging in oral and anal sex acts.   Get vaccinated for HPV and hepatitis. If you have not received these vaccines in the past, talk to your health care provider about whether one or  both might be right for you.   If you are at risk of being infected with HIV, it is recommended that you take a prescription medicine daily to prevent HIV infection. This is called pre-exposure prophylaxis (PrEP). You are considered at risk if:  ? You are a man who has sex with other men (MSM).  ? You are a heterosexual man or woman and are sexually active with more than one partner.  ? You take drugs by injection.  ? You are sexually active with a partner who has HIV.   Talk with your health care provider about whether you are at high risk of being infected with HIV. If you choose to begin PrEP, you should first be tested for HIV. You should then be tested every 3 months for as long as you are taking PrEP.    Contact a health care provider if:   See your health care provider.   Tell your sexual partner(s). They should be tested and treated for any STDs.   Do not have sex until your health care provider says it is okay.  Get help right away if:  Contact your health care provider right away if:   You have severe abdominal pain.   You are a man and notice swelling or pain in your testicles.   You are a woman and notice swelling or pain in your vagina.    This information is not intended to replace advice given to you by your health care provider. Make sure you discuss any questions you have with your health care provider.  Document Released: 05/06/2002 Document Revised: 09/03/2015 Document Reviewed: 09/03/2012  Elsevier Interactive Patient Education  2018 [employer]

## 2016-12-15 LAB — HIV ANTIBODY (ROUTINE TESTING W REFLEX): HIV SCREEN 4TH GENERATION: NONREACTIVE

## 2016-12-15 LAB — HEPATITIS B SURFACE ANTIGEN: Hepatitis B Surface Ag: NEGATIVE

## 2016-12-15 LAB — HEPATITIS C ANTIBODY

## 2016-12-15 LAB — RPR: RPR: NONREACTIVE

## 2016-12-21 ENCOUNTER — Encounter: Payer: Self-pay | Admitting: Obstetrics & Gynecology

## 2016-12-21 ENCOUNTER — Telehealth: Payer: Self-pay | Admitting: General Practice

## 2016-12-21 NOTE — Telephone Encounter (Signed)
Patient called and left message stating she has a question about her mychart results and would like a call back. Called patient and she states she was wondering about her result that says .1. Told patient her hepatitis C is negative. Told patient greater than .9 is positive. Patient verbalized understanding & had no questions

## 2016-12-27 ENCOUNTER — Other Ambulatory Visit (INDEPENDENT_AMBULATORY_CARE_PROVIDER_SITE_OTHER): Payer: Self-pay | Admitting: Physician Assistant

## 2016-12-27 DIAGNOSIS — Z79899 Other long term (current) drug therapy: Secondary | ICD-10-CM

## 2016-12-27 DIAGNOSIS — D649 Anemia, unspecified: Secondary | ICD-10-CM

## 2016-12-27 NOTE — Telephone Encounter (Signed)
FWD to PCP. Natalie Chase, CMA  

## 2017-01-01 ENCOUNTER — Telehealth (INDEPENDENT_AMBULATORY_CARE_PROVIDER_SITE_OTHER): Payer: Self-pay

## 2017-01-01 ENCOUNTER — Other Ambulatory Visit (INDEPENDENT_AMBULATORY_CARE_PROVIDER_SITE_OTHER): Payer: Self-pay | Admitting: Physician Assistant

## 2017-01-01 DIAGNOSIS — N979 Female infertility, unspecified: Secondary | ICD-10-CM

## 2017-01-01 DIAGNOSIS — Z319 Encounter for procreative management, unspecified: Secondary | ICD-10-CM | POA: Diagnosis not present

## 2017-01-01 DIAGNOSIS — Z3181 Encounter for male factor infertility in female patient: Secondary | ICD-10-CM | POA: Diagnosis not present

## 2017-01-01 MED ORDER — THERANATAL COMPLETE 27-1 & 300 MG PO MISC: 1.0000 | Freq: Every day | ORAL | 0 refills | Status: DC

## 2017-01-01 NOTE — Telephone Encounter (Signed)
Patient called requesting Rx for higher dosage of prenatal vitamins, per fertility clinic OTC prenatals will not suffice, told patient to ask PCP to write Rx. Maryjean Mornempestt S Lauryl Seyer, CMA

## 2017-01-01 NOTE — Progress Notes (Signed)
Patient requests a prenatal vitamin. She spoke to her fertility doctor and he told her to begin a prenatal vitamin stronger than 800 mcg of Folate. [facility] and advised for Gyn to take over care if she becomes pregnant.

## 2017-01-01 NOTE — Telephone Encounter (Signed)
I have prescribed Thera Natal Complete. GYN should take over her care once she becomes pregnant.

## 2017-01-01 NOTE — Telephone Encounter (Signed)
Patient aware. Natalie Chase, CMA  

## 2017-01-04 DIAGNOSIS — M064 Inflammatory polyarthropathy: Secondary | ICD-10-CM | POA: Diagnosis not present

## 2017-01-04 DIAGNOSIS — Z23 Encounter for immunization: Secondary | ICD-10-CM | POA: Diagnosis not present

## 2017-01-04 DIAGNOSIS — R768 Other specified abnormal immunological findings in serum: Secondary | ICD-10-CM | POA: Diagnosis not present

## 2017-01-04 DIAGNOSIS — Z79899 Other long term (current) drug therapy: Secondary | ICD-10-CM | POA: Diagnosis not present

## 2017-01-04 DIAGNOSIS — M2550 Pain in unspecified joint: Secondary | ICD-10-CM | POA: Diagnosis not present

## 2017-01-04 DIAGNOSIS — R809 Proteinuria, unspecified: Secondary | ICD-10-CM | POA: Diagnosis not present

## 2017-01-04 DIAGNOSIS — M1990 Unspecified osteoarthritis, unspecified site: Secondary | ICD-10-CM | POA: Diagnosis not present

## 2017-03-04 ENCOUNTER — Other Ambulatory Visit (INDEPENDENT_AMBULATORY_CARE_PROVIDER_SITE_OTHER): Payer: Self-pay | Admitting: Physician Assistant

## 2017-03-04 DIAGNOSIS — Z79899 Other long term (current) drug therapy: Secondary | ICD-10-CM

## 2017-03-04 DIAGNOSIS — D649 Anemia, unspecified: Secondary | ICD-10-CM

## 2017-03-05 ENCOUNTER — Ambulatory Visit (INDEPENDENT_AMBULATORY_CARE_PROVIDER_SITE_OTHER): Payer: BLUE CROSS/BLUE SHIELD | Admitting: General Practice

## 2017-03-05 DIAGNOSIS — Z113 Encounter for screening for infections with a predominantly sexual mode of transmission: Secondary | ICD-10-CM | POA: Diagnosis not present

## 2017-03-05 DIAGNOSIS — N898 Other specified noninflammatory disorders of vagina: Secondary | ICD-10-CM

## 2017-03-05 NOTE — Progress Notes (Signed)
Patient here today for std testing & self swab. Patient reports discharge with fishy odor. Patient denies itching or irritation. States she recently switched laundry detergents and then this happened. Patient instructed in self swab & specimen collected. Told patient we will contact her with abnormal results & any medication needed. Patient verbalized understanding & had no questions

## 2017-03-05 NOTE — Progress Notes (Signed)
I agree with the nurses note. Thressa ShellerHeather Hogan 12:17 PM 03/05/17

## 2017-03-05 NOTE — Telephone Encounter (Signed)
FWD to PCP. Tempestt S Roberts, CMA  

## 2017-03-06 ENCOUNTER — Other Ambulatory Visit: Payer: Self-pay | Admitting: Advanced Practice Midwife

## 2017-03-06 LAB — CERVICOVAGINAL ANCILLARY ONLY
BACTERIAL VAGINITIS: POSITIVE — AB
Candida vaginitis: NEGATIVE
Chlamydia: NEGATIVE
Neisseria Gonorrhea: NEGATIVE
TRICH (WINDOWPATH): NEGATIVE

## 2017-03-06 LAB — HIV ANTIBODY (ROUTINE TESTING W REFLEX): HIV Screen 4th Generation wRfx: NONREACTIVE

## 2017-03-06 LAB — HEPATITIS C ANTIBODY

## 2017-03-06 LAB — HEPATITIS B SURFACE ANTIGEN: Hepatitis B Surface Ag: NEGATIVE

## 2017-03-06 LAB — RPR: RPR: NONREACTIVE

## 2017-03-06 MED ORDER — METRONIDAZOLE 500 MG PO TABS: 500.0000 mg | ORAL_TABLET | Freq: Two times a day (BID) | ORAL | 0 refills | Status: DC

## 2017-03-06 NOTE — Progress Notes (Signed)
Patient notified via mychart message of +BV. RX flagyl 500mg  BID x 7 [facility] [address]   Thressa ShellerHeather Hogan  9:05 PM 03/06/17

## 2017-03-14 ENCOUNTER — Encounter: Payer: Self-pay | Admitting: *Deleted

## 2017-04-20 ENCOUNTER — Ambulatory Visit: Payer: BLUE CROSS/BLUE SHIELD | Admitting: Obstetrics & Gynecology

## 2017-05-03 ENCOUNTER — Ambulatory Visit: Payer: BLUE CROSS/BLUE SHIELD | Admitting: Obstetrics & Gynecology

## 2017-05-09 ENCOUNTER — Encounter (INDEPENDENT_AMBULATORY_CARE_PROVIDER_SITE_OTHER): Payer: Self-pay | Admitting: Physician Assistant

## 2017-05-09 ENCOUNTER — Other Ambulatory Visit: Payer: Self-pay

## 2017-05-09 ENCOUNTER — Ambulatory Visit (INDEPENDENT_AMBULATORY_CARE_PROVIDER_SITE_OTHER): Payer: BLUE CROSS/BLUE SHIELD | Admitting: Physician Assistant

## 2017-05-09 VITALS — BP 127/86 | HR 98 | Temp 99.1°F | Wt 200.4 lb

## 2017-05-09 DIAGNOSIS — I10 Essential (primary) hypertension: Secondary | ICD-10-CM

## 2017-05-09 DIAGNOSIS — N926 Irregular menstruation, unspecified: Secondary | ICD-10-CM | POA: Diagnosis not present

## 2017-05-09 DIAGNOSIS — R5383 Other fatigue: Secondary | ICD-10-CM

## 2017-05-09 DIAGNOSIS — Z23 Encounter for immunization: Secondary | ICD-10-CM

## 2017-05-09 DIAGNOSIS — D649 Anemia, unspecified: Secondary | ICD-10-CM | POA: Diagnosis not present

## 2017-05-09 DIAGNOSIS — R4586 Emotional lability: Secondary | ICD-10-CM | POA: Diagnosis not present

## 2017-05-09 DIAGNOSIS — Z131 Encounter for screening for diabetes mellitus: Secondary | ICD-10-CM

## 2017-05-09 LAB — POCT GLYCOSYLATED HEMOGLOBIN (HGB A1C): HEMOGLOBIN A1C: 5.5

## 2017-05-09 MED ORDER — HYDROCHLOROTHIAZIDE 12.5 MG PO TABS
12.5000 mg | ORAL_TABLET | Freq: Every day | ORAL | 3 refills | Status: DC
Start: 2017-05-09 — End: 2017-07-17

## 2017-05-09 NOTE — Patient Instructions (Signed)

## 2017-05-09 NOTE — Progress Notes (Signed)
Subjective:  Patient ID: Natalie GinsShirley Purohit, female    DOB: 19-Nov-1980  Age: 37 y.o. MRN: [id number]  CC: HTN and medication refill  HPI Natalie Chase is a 37 y.o. female with a medical history of asthma, HTN, and undifferentiated connective tissue disease presents for HTN f/u and medication refill. Says she has been out of her three antihypertensives for one month. Has felt sharp chest pain x3 episodes over the last month. CP lasted "at most 5 minutes". 'Rubbed' the pain away. There is "a lot of sweating" generally. Irregular periods. Mood swings. Fatigue. There is bilateral tingling of the upper and lower extremities associated with swelling at times.  Does not endorse other symptoms at this time.    Outpatient Medications Prior to Visit  Medication Sig Dispense Refill  . albuterol (PROVENTIL HFA;VENTOLIN HFA) 108 (90 Base) MCG/ACT inhaler Inhale 1-2 puffs into the lungs every 6 (six) hours as needed for wheezing or shortness of breath. 1 Inhaler 0  . cholecalciferol (VITAMIN D) 1000 UNITS tablet Take 2,000 Units by mouth daily.    . hydroxychloroquine (PLAQUENIL) 200 MG tablet Take 400 mg by mouth.    . Prenatal MV-Min-Fe Fum-FA-DHA (THERANATAL COMPLETE) 27-1 & 300 MG MISC Take 1 tablet daily by mouth. 273 each 0  . amLODipine (NORVASC) 10 MG tablet Take 1 tablet (10 mg total) by mouth daily. (Patient not taking: Reported on 05/09/2017) 30 tablet 1  . docusate sodium (COLACE) 50 MG capsule Take 1 capsule (50 mg total) by mouth 2 (two) times daily. (Patient not taking: Reported on 12/14/2016) 10 capsule 2  . ferrous sulfate 325 (65 FE) MG tablet TAKE 1 TABLET BY MOUTH TWICE DAILY WITH  A  MEAL (Patient not taking: Reported on 05/09/2017) 60 tablet 0  . hydrochlorothiazide (HYDRODIURIL) 25 MG tablet Take 25 mg by mouth daily.    . metoprolol tartrate (LOPRESSOR) 25 MG tablet Take 25 mg by mouth 2 (two) times daily. Pt unsure of dose    . metroNIDAZOLE (FLAGYL) 500 MG tablet Take 1 tablet (500 mg  total) by mouth 2 (two) times daily. 14 tablet 0  . predniSONE (DELTASONE) 5 MG tablet 20 mg x 1 week, 15 mg x 1 week, 10 mg x 1 week, 5 mg x 1 week     No facility-administered medications prior to visit.      ROS Review of Systems  Constitutional: Negative for chills, fever and malaise/fatigue.  Eyes: Negative for blurred vision.  Respiratory: Negative for shortness of breath.   Cardiovascular: Negative for chest pain and palpitations.  Gastrointestinal: Negative for abdominal pain and nausea.  Genitourinary: Negative for dysuria and hematuria.  Musculoskeletal: Negative for joint pain and myalgias.  Skin: Negative for rash.  Neurological: Negative for tingling and headaches.  Psychiatric/Behavioral: Negative for depression. The patient is not nervous/anxious.     Objective:  Wt 200 lb 6.4 oz (90.9 kg)   BMI 33.35 kg/m   Vitals:   05/09/17 0841  BP: 127/86  Pulse: 98  Temp: 99.1 F (37.3 C)  SpO2: 98%      Physical Exam  Constitutional: She is oriented to person, place, and time.  Well developed, well nourished, NAD, polite  HENT:  Head: Normocephalic and atraumatic.  No oral thrush  Eyes: Conjunctivae are normal. Pupils are equal, round, and reactive to light. No scleral icterus.  Neck: Normal range of motion. Neck supple. No thyromegaly present.  Cardiovascular: Regular rhythm and normal heart sounds.  Rate somewhat elevated  Pulmonary/Chest:  Effort normal and breath sounds normal.  Musculoskeletal: She exhibits no edema.  Neurological: She is alert and oriented to person, place, and time. No cranial nerve deficit. Coordination normal.  Skin: Skin is warm and dry. No rash noted. No erythema. No pallor.  Psychiatric: She has a normal mood and affect. Her behavior is normal. Thought content normal.  Vitals reviewed.    Assessment & Plan:   1. Hypertension, unspecified type - hydrochlorothiazide (HYDRODIURIL) 12.5 MG tablet; Take 1 tablet (12.5 mg total) by  mouth daily.  Dispense: 90 tablet; Refill: 3 - Thyroid Panel With TSH - Comprehensive metabolic panel - CBC with Differential  2. Irregular periods/menstrual cycles - Thyroid Panel With TSH - Comprehensive metabolic panel - CBC with Differential  3. Mood swings - Thyroid Panel With TSH - Comprehensive metabolic panel - CBC with Differential  4. Fatigue, unspecified type - Thyroid Panel With TSH - Comprehensive metabolic panel - CBC with Differential  5. Screening for diabetes mellitus - HgB A1c 5.5% today  6. Anemia, unspecified type - CBC with Differential  7. Need for prophylactic vaccination and inoculation against influenza - Flu Vaccine QUAD 6+ mos PF IM (Fluarix Quad PF)   Meds ordered this encounter  Medications  . hydrochlorothiazide (HYDRODIURIL) 12.5 MG tablet    Sig: Take 1 tablet (12.5 mg total) by mouth daily.    Dispense:  90 tablet    Refill:  3    Order Specific Question:   Supervising Provider    Answer:   Quentin Angst [id number]    Follow-up: Return in about 4 months (around 09/08/2017).   Loletta Specter PA

## 2017-05-10 ENCOUNTER — Telehealth (INDEPENDENT_AMBULATORY_CARE_PROVIDER_SITE_OTHER): Payer: Self-pay

## 2017-05-10 LAB — COMPREHENSIVE METABOLIC PANEL
ALK PHOS: 65 IU/L (ref 39–117)
ALT: 9 IU/L (ref 0–32)
AST: 18 IU/L (ref 0–40)
Albumin/Globulin Ratio: 1.6 (ref 1.2–2.2)
Albumin: 4.4 g/dL (ref 3.5–5.5)
BILIRUBIN TOTAL: 0.2 mg/dL (ref 0.0–1.2)
BUN/Creatinine Ratio: 9 (ref 9–23)
BUN: 8 mg/dL (ref 6–20)
CO2: 25 mmol/L (ref 20–29)
CREATININE: 0.92 mg/dL (ref 0.57–1.00)
Calcium: 9.3 mg/dL (ref 8.7–10.2)
Chloride: 100 mmol/L (ref 96–106)
GFR calc Af Amer: 94 mL/min/{1.73_m2} (ref >59)
GFR calc non Af Amer: 81 mL/min/{1.73_m2} (ref >59)
GLUCOSE: 77 mg/dL (ref 65–99)
Globulin, Total: 2.8 g/dL (ref 1.5–4.5)
POTASSIUM: 4.1 mmol/L (ref 3.5–5.2)
Sodium: 140 mmol/L (ref 134–144)
TOTAL PROTEIN: 7.2 g/dL (ref 6.0–8.5)

## 2017-05-10 LAB — CBC WITH DIFFERENTIAL/PLATELET
BASOS: 0 %
Basophils Absolute: 0 10*3/uL (ref 0.0–0.2)
EOS (ABSOLUTE): 0.3 10*3/uL (ref 0.0–0.4)
EOS: 2 %
HEMATOCRIT: 39.3 % (ref 34.0–46.6)
Hemoglobin: 12.5 g/dL (ref 11.1–15.9)
Immature Grans (Abs): 0 10*3/uL (ref 0.0–0.1)
Immature Granulocytes: 0 %
LYMPHS ABS: 2.5 10*3/uL (ref 0.7–3.1)
Lymphs: 24 %
MCH: 26.2 pg — AB (ref 26.6–33.0)
MCHC: 31.8 g/dL (ref 31.5–35.7)
MCV: 82 fL (ref 79–97)
MONOS ABS: 0.7 10*3/uL (ref 0.1–0.9)
Monocytes: 7 %
NEUTROS ABS: 6.8 10*3/uL (ref 1.4–7.0)
Neutrophils: 67 %
PLATELETS: 381 10*3/uL — AB (ref 150–379)
RBC: 4.78 x10E6/uL (ref 3.77–5.28)
RDW: 17 % — AB (ref 12.3–15.4)
WBC: 10.3 10*3/uL (ref 3.4–10.8)

## 2017-05-10 LAB — THYROID PANEL WITH TSH
FREE THYROXINE INDEX: 2.2 (ref 1.2–4.9)
T3 Uptake Ratio: 24 % (ref 24–39)
T4, Total: 9.3 ug/dL (ref 4.5–12.0)
TSH: 1.07 u[IU]/mL (ref 0.450–4.500)

## 2017-05-10 NOTE — Telephone Encounter (Signed)
-----   Message from Loletta Specteroger David Gomez, PA-C sent at 05/10/2017  1:34 PM EDT ----- Labs normal.

## 2017-05-10 NOTE — Telephone Encounter (Signed)
Patient aware of normal labs. Natalie Chase S Cesily Cuoco, CMA  

## 2017-06-04 DIAGNOSIS — M2550 Pain in unspecified joint: Secondary | ICD-10-CM | POA: Diagnosis not present

## 2017-06-04 DIAGNOSIS — M222X1 Patellofemoral disorders, right knee: Secondary | ICD-10-CM | POA: Diagnosis not present

## 2017-06-04 DIAGNOSIS — R809 Proteinuria, unspecified: Secondary | ICD-10-CM | POA: Diagnosis not present

## 2017-06-04 DIAGNOSIS — M25561 Pain in right knee: Secondary | ICD-10-CM | POA: Diagnosis not present

## 2017-06-04 DIAGNOSIS — Z23 Encounter for immunization: Secondary | ICD-10-CM | POA: Diagnosis not present

## 2017-06-04 DIAGNOSIS — M7711 Lateral epicondylitis, right elbow: Secondary | ICD-10-CM | POA: Diagnosis not present

## 2017-06-04 DIAGNOSIS — M1990 Unspecified osteoarthritis, unspecified site: Secondary | ICD-10-CM | POA: Diagnosis not present

## 2017-06-04 DIAGNOSIS — R768 Other specified abnormal immunological findings in serum: Secondary | ICD-10-CM | POA: Diagnosis not present

## 2017-06-11 ENCOUNTER — Encounter: Payer: Self-pay | Admitting: Obstetrics & Gynecology

## 2017-06-11 ENCOUNTER — Ambulatory Visit: Payer: BLUE CROSS/BLUE SHIELD | Admitting: Obstetrics & Gynecology

## 2017-06-11 VITALS — BP 155/94 | HR 87 | Wt 198.5 lb

## 2017-06-11 DIAGNOSIS — Z113 Encounter for screening for infections with a predominantly sexual mode of transmission: Secondary | ICD-10-CM | POA: Diagnosis not present

## 2017-06-11 DIAGNOSIS — N871 Moderate cervical dysplasia: Secondary | ICD-10-CM

## 2017-06-11 DIAGNOSIS — Z1151 Encounter for screening for human papillomavirus (HPV): Secondary | ICD-10-CM

## 2017-06-11 DIAGNOSIS — Z01419 Encounter for gynecological examination (general) (routine) without abnormal findings: Secondary | ICD-10-CM | POA: Diagnosis not present

## 2017-06-11 DIAGNOSIS — Z124 Encounter for screening for malignant neoplasm of cervix: Secondary | ICD-10-CM | POA: Diagnosis not present

## 2017-06-11 DIAGNOSIS — I10 Essential (primary) hypertension: Secondary | ICD-10-CM | POA: Insufficient documentation

## 2017-06-11 DIAGNOSIS — Z3202 Encounter for pregnancy test, result negative: Secondary | ICD-10-CM

## 2017-06-11 NOTE — Progress Notes (Signed)
Entered in error.  Thressa ShellerHeather Hogan 12:55 PM 06/11/17

## 2017-06-11 NOTE — Progress Notes (Signed)
Patient ID: Natalie Chase, female   DOB: 14-Nov-1980, 37 y.o.   MRN: [id number]  Chief Complaint  Patient presents with  . Gynecologic Exam    HPI Natalie Chase is a 37 y.o. female.  G1P1001 No LMP recorded. LMP 2 months ago, menses irregular. 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  HPI  Past Medical History:  Diagnosis Date  . Asthma   . Hypertension   . Sickle cell trait (HCC)   . Vaginal Pap smear, abnormal     Past Surgical History:  Procedure Laterality Date  . CHOLECYSTECTOMY      Family History  Problem Relation Age of Onset  . Achondroplasia Mother   . Lupus Mother   . Sickle cell anemia Mother   . Hypertension Father   . Stroke Father   . Cancer Other     Social History Social History   Tobacco Use  . Smoking status: Former Smoker    Packs/day: 0.25    Types: Cigarettes    Last attempt to quit: 01/09/2017    Years since quitting: 0.4  . Smokeless tobacco: Former Engineer, waterUser  Substance Use Topics  . Alcohol use: Not Currently  . Drug use: Yes    Types: Marijuana    No Known Allergies  Current Outpatient Medications  Medication Sig Dispense Refill  . albuterol (PROVENTIL HFA;VENTOLIN HFA) 108 (90 Base) MCG/ACT inhaler Inhale 1-2 puffs into the lungs every 6 (six) hours as needed for wheezing or shortness of breath. 1 Inhaler 0  . azaTHIOprine (IMURAN) 50 MG tablet Take 100 mg by mouth daily.    . cholecalciferol (VITAMIN D) 1000 UNITS tablet Take 2,000 Units by mouth daily.    . hydrochlorothiazide (HYDRODIURIL) 12.5 MG tablet Take 1 tablet (12.5 mg total) by mouth daily. 90 tablet 3  . hydroxychloroquine (PLAQUENIL) 200 MG tablet Take 400 mg by mouth.    . Prenatal MV-Min-Fe Fum-FA-DHA (THERANATAL COMPLETE) 27-1 & 300 MG MISC Take 1 tablet daily by mouth. 273 each 0   No current facility-administered medications for this visit.     Review of Systems Review of Systems  Constitutional: Negative.   Respiratory: Negative.    Gastrointestinal: Negative.   Genitourinary: Negative.  Negative for vaginal bleeding and vaginal discharge.    Blood pressure (!) 155/94, pulse 87, weight 198 lb 8 oz (90 kg).  Physical Exam Physical Exam  Constitutional: She is oriented to person, place, and time. She appears well-developed. No distress.  Cardiovascular: Normal rate.  Pulmonary/Chest: Effort normal. No respiratory distress.  Abdominal: Soft. There is tenderness.  Genitourinary:  Genitourinary Comments: Pelvic exam: normal external genitalia, vulva, vagina, cervix, uterus and adnexa.   Musculoskeletal: Normal range of motion.  Neurological: She is alert and oriented to person, place, and time.  Psychiatric: She has a normal mood and affect.  Vitals reviewed. Breasts: breasts appear normal, no suspicious masses, no skin or nipple changes or axillary nodes.   Data Reviewed Pap and surgical path results  Assessment    Patient Active Problem List   Diagnosis Date Noted  . Hypertension 06/11/2017  . 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 ASCUS with positive high risk HPV cervical 10/29/2014   Well woman exam    Plan    If pap is normal today then routine f/u gyn care  F/u with PCP for BP management        Scheryl DarterJames Davis Ambrosini 06/11/2017, 12:04 PM

## 2017-06-12 LAB — CYTOLOGY - PAP
CHLAMYDIA, DNA PROBE: NEGATIVE
Diagnosis: NEGATIVE
HPV (WINDOPATH): NOT DETECTED
Neisseria Gonorrhea: NEGATIVE

## 2017-06-22 DIAGNOSIS — Z319 Encounter for procreative management, unspecified: Secondary | ICD-10-CM | POA: Diagnosis not present

## 2017-06-26 DIAGNOSIS — Z3189 Encounter for other procreative management: Secondary | ICD-10-CM | POA: Diagnosis not present

## 2017-07-16 DIAGNOSIS — Z319 Encounter for procreative management, unspecified: Secondary | ICD-10-CM | POA: Diagnosis not present

## 2017-07-17 ENCOUNTER — Other Ambulatory Visit: Payer: Self-pay

## 2017-07-17 ENCOUNTER — Telehealth (INDEPENDENT_AMBULATORY_CARE_PROVIDER_SITE_OTHER): Payer: Self-pay | Admitting: Physician Assistant

## 2017-07-17 ENCOUNTER — Ambulatory Visit (INDEPENDENT_AMBULATORY_CARE_PROVIDER_SITE_OTHER): Payer: BLUE CROSS/BLUE SHIELD | Admitting: Nurse Practitioner

## 2017-07-17 ENCOUNTER — Encounter (INDEPENDENT_AMBULATORY_CARE_PROVIDER_SITE_OTHER): Payer: Self-pay | Admitting: Nurse Practitioner

## 2017-07-17 VITALS — BP 133/85 | HR 85 | Temp 97.9°F | Ht 65.0 in | Wt 196.4 lb

## 2017-07-17 DIAGNOSIS — Z862 Personal history of diseases of the blood and blood-forming organs and certain disorders involving the immune mechanism: Secondary | ICD-10-CM | POA: Diagnosis not present

## 2017-07-17 DIAGNOSIS — I10 Essential (primary) hypertension: Secondary | ICD-10-CM

## 2017-07-17 MED ORDER — HYDROCHLOROTHIAZIDE 25 MG PO TABS: 12.5000 mg | ORAL_TABLET | Freq: Every day | ORAL | 1 refills | Status: DC

## 2017-07-17 MED ORDER — FERROUS SULFATE 325 (65 FE) MG PO TABS: 325.0000 mg | ORAL_TABLET | Freq: Every day | ORAL | 3 refills | Status: DC

## 2017-07-17 MED ORDER — HYDROCHLOROTHIAZIDE 25 MG PO TABS: 25.0000 mg | ORAL_TABLET | Freq: Every day | ORAL | 1 refills | Status: DC

## 2017-07-17 NOTE — Patient Instructions (Addendum)

## 2017-07-17 NOTE — Telephone Encounter (Signed)
Pt's [facility] pharmacy called to inform they received two prescriptions for -hydrochlorothiazide  They disposed one and kept the 1 tablet (25 mg total) by mouth daily.as states on Epic.  please follow up with any questions or concerns. PHONE:[phone]

## 2017-07-17 NOTE — Progress Notes (Signed)
Assessment & Plan:  Natalie Chase was seen today for blood pressure check.  Diagnoses and all orders for this visit:  Essential hypertension -     hydrochlorothiazide (HYDRODIURIL) 25 MG tablet; Take 1 tablet (25 mg total) by mouth daily. -     Lipid panel  Continue all antihypertensives as prescribed.  Remember to bring in your blood pressure log with you for your follow up appointment.  DASH/Mediterranean Diets are healthier choices for HTN.    History of anemia -     ferrous sulfate 325 (65 FE) MG tablet; Take 1 tablet (325 mg total) by mouth daily with breakfast. -     CBC     Patient has been counseled on age-appropriate routine health concerns for screening and prevention. These are reviewed and up-to-date. Referrals have been placed accordingly. Immunizations are up-to-date or declined.    Subjective:   Chief Complaint  Patient presents with  . Blood Pressure Check    medication management    HPI Natalie Chase 37 y.o. female presents to office today for follow up. She has a history of HTN.    Essential Hypertension She has been taking HCTZ 2 tablets for a total of 25 mg instead of 12.5 mg daily.  As her blood pressure is well controlled we will continue her on 25 mg as of today.  She does endorse some intermittent sensations of chest pain which have significantly lessened since she increased the hydrochlorothiazide on her own.  She is chest pain-free today.  We will continue to monitor for increased or reduction in symptoms.  Will order lipid panel today.  She does have a history of iron deficiency however her most recent CBC a few months ago was normal so chest pain not likely associated with any anemia.  Will obtain repeat CBC today and I have also instructed her to continue her ferrous sulfate daily.  Based on lipid panel she may need additional cardiac work-up which will be deferred to her PCP. Denies shortness of breath, palpitations, lightheadedness, dizziness, headaches  or BLE edema.  BP Readings from Last 3 Encounters:  07/17/17 133/85  06/11/17 (!) 155/94  05/09/17 127/86    Review of Systems  Constitutional: Negative for fever, malaise/fatigue and weight loss.  HENT: Negative.  Negative for nosebleeds.   Eyes: Negative.  Negative for blurred vision, double vision and photophobia.  Respiratory: Negative.  Negative for cough and shortness of breath.   Cardiovascular: Positive for chest pain (intermittent). Negative for palpitations and leg swelling.  Gastrointestinal: Negative.  Negative for heartburn, nausea and vomiting.  Musculoskeletal: Negative.  Negative for myalgias.  Neurological: Negative.  Negative for dizziness, focal weakness, seizures and headaches.  Psychiatric/Behavioral: Negative.  Negative for suicidal ideas.    Past Medical History:  Diagnosis Date  . Asthma   . Hypertension   . Sickle cell trait (HCC)   . Vaginal Pap smear, abnormal     Past Surgical History:  Procedure Laterality Date  . CHOLECYSTECTOMY      Family History  Problem Relation Age of Onset  . Achondroplasia Mother   . Lupus Mother   . Sickle cell anemia Mother   . Hypertension Father   . Stroke Father   . Cancer Other     Social History Reviewed with no changes to be made today.   Outpatient Medications Prior to Visit  Medication Sig Dispense Refill  . albuterol (PROVENTIL HFA;VENTOLIN HFA) 108 (90 Base) MCG/ACT inhaler Inhale 1-2 puffs into the lungs  every 6 (six) hours as needed for wheezing or shortness of breath. 1 Inhaler 0  . azaTHIOprine (IMURAN) 50 MG tablet Take 100 mg by mouth daily.    . cholecalciferol (VITAMIN D) 1000 UNITS tablet Take 2,000 Units by mouth daily.    . hydroxychloroquine (PLAQUENIL) 200 MG tablet Take 400 mg by mouth.    . Prenatal MV-Min-Fe Fum-FA-DHA (THERANATAL COMPLETE) 27-1 & 300 MG MISC Take 1 tablet daily by mouth. 273 each 0  . hydrochlorothiazide (HYDRODIURIL) 12.5 MG tablet Take 1 tablet (12.5 mg total) by  mouth daily. 90 tablet 3  . diclofenac (VOLTAREN) 50 MG EC tablet Take 50 mg by mouth 2 (two) times daily as needed.  0   No facility-administered medications prior to visit.     No Known Allergies     Objective:    BP 133/85 (BP Location: Left Arm, Patient Position: Sitting, Cuff Size: Normal)   Pulse 85   Temp 97.9 F (36.6 C) (Oral)   Ht  (1.651 m)   Wt 196 lb 6.4 oz (89.1 kg)   LMP 07/14/2017   SpO2 96%   BMI 32.68 kg/m  Wt Readings from Last 3 Encounters:  07/17/17 196 lb 6.4 oz (89.1 kg)  06/11/17 198 lb 8 oz (90 kg)  05/09/17 200 lb 6.4 oz (90.9 kg)    Physical Exam  Constitutional: She is oriented to person, place, and time. She appears well-developed and well-nourished. She is cooperative.  HENT:  Head: Normocephalic and atraumatic.  Eyes: EOM are normal.  Neck: Normal range of motion.  Cardiovascular: Normal rate, regular rhythm and normal heart sounds. Exam reveals no gallop and no friction rub.  No murmur heard. Pulmonary/Chest: Effort normal and breath sounds normal. No tachypnea. No respiratory distress. She has no decreased breath sounds. She has no wheezes. She has no rhonchi. She has no rales. She exhibits no tenderness.  Abdominal: Bowel sounds are normal.  Musculoskeletal: Normal range of motion. She exhibits no edema.  Neurological: She is alert and oriented to person, place, and time. Coordination normal.  Skin: Skin is warm and dry.  Psychiatric: She has a normal mood and affect. Her behavior is normal. Judgment and thought content normal.  Nursing note and vitals reviewed.      Patient has been counseled extensively about nutrition and exercise as well as the importance of adherence with medications and regular follow-up. The patient was given clear instructions to go to ER or return to medical center if symptoms don't improve, worsen or new problems develop. The patient verbalized understanding.   Follow-up: Return in about 3 months (around  10/17/2017) for HTN.   Claiborne Rigg, FNP-BC [facility] and [facility] [place], [place] [phone]   07/17/2017, 9:08 AM

## 2017-07-17 NOTE — Telephone Encounter (Signed)
Agree/Noted. 

## 2017-07-18 LAB — CBC
HEMOGLOBIN: 12.1 g/dL (ref 11.1–15.9)
Hematocrit: 37.3 % (ref 34.0–46.6)
MCH: 26.6 pg (ref 26.6–33.0)
MCHC: 32.4 g/dL (ref 31.5–35.7)
MCV: 82 fL (ref 79–97)
Platelets: 334 10*3/uL (ref 150–450)
RBC: 4.55 x10E6/uL (ref 3.77–5.28)
RDW: 16.8 % — ABNORMAL HIGH (ref 12.3–15.4)
WBC: 8.2 10*3/uL (ref 3.4–10.8)

## 2017-07-18 LAB — LIPID PANEL
CHOLESTEROL TOTAL: 133 mg/dL (ref 100–199)
Chol/HDL Ratio: 3.4 ratio (ref 0.0–4.4)
HDL: 39 mg/dL — AB (ref >39)
LDL Calculated: 74 mg/dL (ref 0–99)
TRIGLYCERIDES: 101 mg/dL (ref 0–149)
VLDL CHOLESTEROL CAL: 20 mg/dL (ref 5–40)

## 2017-09-11 ENCOUNTER — Ambulatory Visit (INDEPENDENT_AMBULATORY_CARE_PROVIDER_SITE_OTHER): Payer: BLUE CROSS/BLUE SHIELD | Admitting: Physician Assistant

## 2017-09-25 DIAGNOSIS — Z3200 Encounter for pregnancy test, result unknown: Secondary | ICD-10-CM | POA: Diagnosis not present

## 2017-10-02 ENCOUNTER — Ambulatory Visit (INDEPENDENT_AMBULATORY_CARE_PROVIDER_SITE_OTHER): Payer: BLUE CROSS/BLUE SHIELD | Admitting: Physician Assistant

## 2017-11-06 ENCOUNTER — Telehealth: Payer: Self-pay

## 2017-11-06 MED ORDER — METRONIDAZOLE 500 MG PO TABS: 500.0000 mg | ORAL_TABLET | Freq: Two times a day (BID) | ORAL | 0 refills | Status: AC

## 2017-11-06 NOTE — Telephone Encounter (Signed)
Pt reports fishy odor.  Pt reports that when she had these symptoms earlier this year and was diagnosed with BV.  Flagyl 500mg  PO BID for 7 days ordered.  Pharmacy confirmed by pt.

## 2017-11-06 NOTE — Telephone Encounter (Signed)
Pt called Nurse VM today at 10:43am, stating that she already knows that she has BV due to her changing her detergent. She wants to know if she can bypass a test and just get a Rx sent to her pharmacy. Requesting call back at [phone].

## 2017-11-09 DIAGNOSIS — Z319 Encounter for procreative management, unspecified: Secondary | ICD-10-CM | POA: Diagnosis not present
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
## 2017-12-20 DIAGNOSIS — M064 Inflammatory polyarthropathy: Secondary | ICD-10-CM | POA: Diagnosis not present

## 2017-12-20 DIAGNOSIS — M2550 Pain in unspecified joint: Secondary | ICD-10-CM | POA: Diagnosis not present

## 2017-12-20 DIAGNOSIS — R768 Other specified abnormal immunological findings in serum: Secondary | ICD-10-CM | POA: Diagnosis not present

## 2017-12-20 DIAGNOSIS — Z23 Encounter for immunization: Secondary | ICD-10-CM | POA: Diagnosis not present

## 2017-12-20 DIAGNOSIS — M7061 Trochanteric bursitis, right hip: Secondary | ICD-10-CM | POA: Diagnosis not present

## 2017-12-20 DIAGNOSIS — Z79899 Other long term (current) drug therapy: Secondary | ICD-10-CM | POA: Diagnosis not present

## 2017-12-20 DIAGNOSIS — M359 Systemic involvement of connective tissue, unspecified: Secondary | ICD-10-CM | POA: Diagnosis not present

## 2017-12-20 DIAGNOSIS — R809 Proteinuria, unspecified: Secondary | ICD-10-CM | POA: Diagnosis not present

## 2017-12-24 ENCOUNTER — Ambulatory Visit (INDEPENDENT_AMBULATORY_CARE_PROVIDER_SITE_OTHER): Payer: BLUE CROSS/BLUE SHIELD | Admitting: Physician Assistant

## 2017-12-24 ENCOUNTER — Encounter (INDEPENDENT_AMBULATORY_CARE_PROVIDER_SITE_OTHER): Payer: Self-pay | Admitting: Physician Assistant

## 2017-12-24 VITALS — BP 107/69 | HR 68 | Temp 98.8°F | Resp 18 | Ht 65.0 in | Wt 202.0 lb

## 2017-12-24 DIAGNOSIS — I10 Essential (primary) hypertension: Secondary | ICD-10-CM | POA: Diagnosis not present

## 2017-12-24 DIAGNOSIS — N939 Abnormal uterine and vaginal bleeding, unspecified: Secondary | ICD-10-CM | POA: Diagnosis not present

## 2017-12-24 DIAGNOSIS — R079 Chest pain, unspecified: Secondary | ICD-10-CM | POA: Diagnosis not present

## 2017-12-24 DIAGNOSIS — I808 Phlebitis and thrombophlebitis of other sites: Secondary | ICD-10-CM | POA: Diagnosis not present

## 2017-12-24 NOTE — Progress Notes (Signed)
Subjective:  Patient ID: Natalie Chase, female    DOB: Nov 08, 1980  Age: 37 y.o. MRN: [id number]  CC: HTN f/u   HPI  Natalie Chase a 37 y.o.femalewith a medical history of asthma, HTN, and undifferentiated connective tissue disease presents for HTN f/u. Last BP 133/85 mmHg nearly five months ago. Taking meds as directed. BP 107/69 mmHg today.      Main complaint today is of a "bump" on right shoulder. Present for a few days. Seems to be self resolving. No suppuration, bleeding, erythema, or induration.    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 She did not have a pregnancy test done to confirm pregnancy because she began to have a cycle with vaginal bleeding. Pt is curious to know if bleeding is from menstrual bleeding or from pregnancy complication. Does not endorse any other associated symptoms.      Pt states she has noticed left sided chest pain waxing and waning since "years ago".  Last episode of chest pain nearly one month ago. Sometimes has associated "lights flashing like little stars" in the visual fields. Pain lasts approximately one minute and self resolves. Pain is sharp and rated 4/10. Pt did not know much about her family medical history but has recently learned from her cousin that her grandmother passed away at 58 yo due to MI. Pt was also told of a history of sarcoidosis in the family.     Pt states she had a large linear "bruise" on the medial aspect of her LUE that led to the axilla approximately three weeks ago. Was associated with pain and with a firm cordlike feeling. Shows a picture taken on her cell phone that demonstrates a linear and darkened lesion on left medial biceps. States the lesion has self resolved.     Outpatient Medications Prior to Visit  Medication Sig Dispense Refill  . albuterol (PROVENTIL HFA;VENTOLIN HFA) 108 (90 Base) MCG/ACT inhaler Inhale 1-2 puffs into the lungs every 6 (six) hours as needed for wheezing or shortness of breath. 1  Inhaler 0  . azaTHIOprine (IMURAN) 50 MG tablet Take 100 mg by mouth daily.    . cholecalciferol (VITAMIN D) 1000 UNITS tablet Take 2,000 Units by mouth daily.    . diclofenac (VOLTAREN) 50 MG EC tablet Take 50 mg by mouth 2 (two) times daily as needed.  0  . ferrous sulfate 325 (65 FE) MG tablet Take 1 tablet (325 mg total) by mouth daily with breakfast. 90 tablet 3  . hydrochlorothiazide (HYDRODIURIL) 25 MG tablet Take 1 tablet (25 mg total) by mouth daily. 90 tablet 1  . hydroxychloroquine (PLAQUENIL) 200 MG tablet Take 400 mg by mouth.    . Prenatal MV-Min-Fe Fum-FA-DHA (THERANATAL COMPLETE) 27-1 & 300 MG MISC Take 1 tablet daily by mouth. 273 each 0   No facility-administered medications prior to visit.      ROS Review of Systems  Constitutional: Negative for chills, fever and malaise/fatigue.  Eyes: Negative for blurred vision.  Respiratory: Negative for shortness of breath.   Cardiovascular: Positive for chest pain. Negative for palpitations.  Gastrointestinal: Negative for abdominal pain and nausea.  Genitourinary: Negative for dysuria and hematuria.  Musculoskeletal: Negative for joint pain and myalgias.  Skin: Negative for rash.       Bruise on left arm  Neurological: Negative for tingling and headaches.  Psychiatric/Behavioral: Negative for depression. The patient is not nervous/anxious.     Objective:  Resp 18   Ht 5\' 5"  (1.651 m)  Wt 202 lb (91.6 kg)   LMP 11/27/2017 Comment: 12/24/17 2hd cycle of the month  BMI 33.61 kg/m     Vitals:   12/24/17 0850  BP: 107/69  Pulse: 68  Resp: 18  Temp: 98.8 F (37.1 C)  TempSrc: Oral  SpO2: 100%  Weight: 202 lb (91.6 kg)  Height: 5\' 5"  (1.651 m)      Physical Exam  Constitutional: She is oriented to person, place, and time.  Well developed, obese, NAD, polite  HENT:  Head: Normocephalic and atraumatic.  Eyes: No scleral icterus.  Neck: Normal range of motion. Neck supple. No thyromegaly present.   Cardiovascular: Normal rate, regular rhythm, normal heart sounds and intact distal pulses.  No cording, discoloration, or tenderness of the LUE  Pulmonary/Chest: Effort normal and breath sounds normal.  Musculoskeletal: She exhibits no edema.  Neurological: She is alert and oriented to person, place, and time.  Skin: Skin is warm and dry. No rash noted. No erythema. No pallor.  Psychiatric: She has a normal mood and affect. Her behavior is normal. Thought content normal.  Vitals reviewed.    Assessment & Plan:     1. Essential hypertension - Comprehensive metabolic panel - CBC with Differential  2. Vaginal bleeding - urine specimen not submitted or tested. Will call [facility] to add on bHCG   3. Chest pain, unspecified type - EKG 12-Lead - Comprehensive metabolic panel - CBC with Differential - D-dimer, quantitative (not at [facility])  4. Thrombophlebitis arm - Comprehensive metabolic panel - CBC with Differential - D-dimer, quantitative (not at [facility]) - PT and PTT - Angiotensin converting enzyme     Follow-up: Return in about 4 weeks (around 01/21/2018) for chest wall pain and thrombophlebitis.   Loletta Specter PA

## 2017-12-24 NOTE — Patient Instructions (Addendum)
I will call you with lab results. I do not think you have a DVT but read the following to educate yourself on signs and symptoms of DVT.  Deep Vein Thrombosis Deep vein thrombosis (DVT) is a condition in which a blood clot forms in a deep vein, such as a lower leg, thigh, or arm vein. A clot is blood that has thickened into a gel or solid. This condition is dangerous. It can lead to serious and even life-threatening complications if the clot travels to the lungs and causes a blockage (pulmonary embolism). It can also damage veins in the leg. This can result in leg pain, swelling, discoloration, and sores (post-thrombotic syndrome). What are the causes? This condition may be caused by:  A slowdown of blood flow.  Damage to a vein.  A condition that makes blood clot more easily.  What increases the risk? The following factors may make you more likely to develop this condition:  Being overweight.  Being elderly, especially over age 51.  Sitting or lying down for more than four hours.  Lack of physical activity (sedentary lifestyle).  Being pregnant, giving birth, or having recently given birth.  Taking medicines that contain estrogen.  Smoking.  A history of any of the following: ? Blood clots or blood clotting disease. ? Peripheral vascular disease. ? Inflammatory bowel disease. ? Cancer. ? Heart disease. ? Genetic conditions that affect how blood clots. ? Neurological diseases that affect the legs (leg paresis). ? Injury. ? Major or lengthy surgery. ? A central line placed inside a large vein.  What are the signs or symptoms? Symptoms of this condition include:  Swelling, pain, or tenderness in an arm or leg.  Warmth, redness, or discoloration in an arm or leg.  If the clot is in your leg, symptoms may be more noticeable or worse when you stand or walk. Some people do not have any symptoms. How is this diagnosed? This condition is diagnosed with:  A medical  history.  A physical exam.  Tests, such as: ? Blood tests. These are done to see how your blood clots. ? Imaging tests. These are done to check for clots. Tests may include:  Ultrasound.  CT scan.  MRI.  X-ray.  Venogram. For this test, X-rays are taken after a dye is injected into a vein.  How is this treated? Treatment for this condition depends on the cause, your risk for bleeding or developing more clots, and any medical conditions you have. Treatment may include:  Taking blood thinners (also called anticoagulants). These medicines may be taken by mouth, injected under the skin, or injected through an IV tube (catheter). These medicines prevent clots from forming.  Injecting medicine that dissolves blood clots into the affected vein (catheter-directed thrombolysis).  Having surgery. Surgery may be done to: ? Remove the clot. ? Place a filter in a large vein to catch blood clots before they reach the lungs.  Some treatments may be continued for up to six months. Follow these instructions at home: If you are taking an oral blood thinner:  Take the medicine exactly as told by your health care provider. Some blood thinners need to be taken at the same time every day. Do not skip a dose.  Ask your health care provider about what foods and drugs interact with the medicine.  Ask about possible side effects. General instructions  Blood thinners can cause easy bruising and difficulty stopping bleeding. Because of this, if you are taking or were  given a blood thinner: ? Hold pressure over cuts for longer than usual. ? Tell your dentist and other health care providers that you are taking blood thinners before having any procedures that can cause bleeding. ? Avoid contact sports.  Take over-the-counter and prescription medicines only as told by your health care provider.  Return to your normal activities as told by your health care provider. Ask your health care provider what  activities are safe for you.  Wear compression stockings if recommended by your health care provider.  Keep all follow-up visits as told by your health care provider. This is important. How is this prevented? To lower your risk of developing this condition again:  For 30 or more minutes every day, do an activity that: ? Involves moving your arms and legs. ? Increases your heart rate.  When traveling for longer than four hours: ? Exercise your arms and legs every hour. ? Drink plenty of water. ? Avoid drinking alcohol.  Avoid sitting or lying for a long time without moving your legs.  Stay a healthy weight.  If you are a woman who is older than age 28, avoid unnecessary use of medicines that contain estrogen.  Do not use any products that contain nicotine or tobacco, such as cigarettes and e-cigarettes. This is especially important if you take estrogen medicines. If you need help quitting, ask your health care provider.  Contact a health care provider if:  You miss a dose of your blood thinner.  You have nausea, vomiting, or diarrhea that lasts for more than one day.  Your menstrual period is heavier than usual.  You have unusual bruising. Get help right away if:  You have new or increased pain, swelling, or redness in an arm or leg.  You have numbness or tingling in an arm or leg.  You have shortness of breath.  You have chest pain.  You have a rapid or irregular heartbeat.  You feel light-headed or dizzy.  You cough up blood.  There is blood in your vomit, stool, or urine.  You have a serious fall or accident, or you hit your head.  You have a severe headache or confusion.  You have a cut that will not stop bleeding. These symptoms may represent a serious problem that is an emergency. Do not wait to see if the symptoms will go away. Get medical help right away. Call your local emergency services (911 in the [place] Do not drive yourself to the  hospital. Summary  DVT is a condition in which a blood clot forms in a deep vein, such as a lower leg, thigh, or arm vein.  Symptoms can include swelling, warmth, pain, and redness in your leg or arm.  Treatment may include taking blood thinners, injecting medicine that dissolves blood clots,wearing compression stockings, or surgery.  If you are prescribed blood thinners, take them exactly as told. This information is not intended to replace advice given to you by your health care provider. Make sure you discuss any questions you have with your health care provider. Document Released: 02/13/2005 Document Revised: 03/18/2016 Document Reviewed: 03/18/2016 Elsevier Interactive Patient Education  2018 [employer]    Chest Wall Pain Chest wall pain is pain in or around the bones and muscles of your chest. Sometimes, an injury causes this pain. Sometimes, the cause may not be known. This pain may take several weeks or longer to get better. Follow these instructions at home: Pay attention to any changes in your  symptoms. Take these actions to help with your pain:  Rest as told by your doctor.  Avoid activities that cause pain. Try not to use your chest, belly (abdominal), or side muscles to lift heavy things.  If directed, apply ice to the painful area: ? Put ice in a plastic bag. ? Place a towel between your skin and the bag. ? Leave the ice on for 20 minutes, 2-3 times per day.  Take over-the-counter and prescription medicines only as told by your doctor.  Do not use tobacco products, including cigarettes, chewing tobacco, and e-cigarettes. If you need help quitting, ask your doctor.  Keep all follow-up visits as told by your doctor. This is important.  Contact a doctor if:  You have a fever.  Your chest pain gets worse.  You have new symptoms. Get help right away if:  You feel sick to your stomach (nauseous) or you throw up (vomit).  You feel sweaty or  light-headed.  You have a cough with phlegm (sputum) or you cough up blood.  You are short of breath. This information is not intended to replace advice given to you by your health care provider. Make sure you discuss any questions you have with your health care provider. Document Released: 08/02/2007 Document Revised: 07/22/2015 Document Reviewed: 05/11/2014 Elsevier Interactive Patient Education  [employer].

## 2017-12-25 LAB — COMPREHENSIVE METABOLIC PANEL
ALBUMIN: 4.2 g/dL (ref 3.5–5.5)
ALK PHOS: 55 IU/L (ref 39–117)
ALT: 11 IU/L (ref 0–32)
AST: 11 IU/L (ref 0–40)
Albumin/Globulin Ratio: 1.8 (ref 1.2–2.2)
BUN / CREAT RATIO: 10 (ref 9–23)
BUN: 9 mg/dL (ref 6–20)
CHLORIDE: 101 mmol/L (ref 96–106)
CO2: 26 mmol/L (ref 20–29)
CREATININE: 0.87 mg/dL (ref 0.57–1.00)
Calcium: 9.2 mg/dL (ref 8.7–10.2)
GFR calc Af Amer: 100 mL/min/{1.73_m2} (ref >59)
GFR calc non Af Amer: 87 mL/min/{1.73_m2} (ref >59)
GLOBULIN, TOTAL: 2.4 g/dL (ref 1.5–4.5)
Glucose: 93 mg/dL (ref 65–99)
POTASSIUM: 4.1 mmol/L (ref 3.5–5.2)
SODIUM: 139 mmol/L (ref 134–144)
Total Protein: 6.6 g/dL (ref 6.0–8.5)

## 2017-12-25 LAB — PT AND PTT
APTT: 30 s (ref 24–33)
INR: 1.1 (ref 0.8–1.2)
Prothrombin Time: 11.1 s (ref 9.1–12.0)

## 2017-12-25 LAB — CBC WITH DIFFERENTIAL/PLATELET
BASOS ABS: 0.1 10*3/uL (ref 0.0–0.2)
Basos: 1 %
EOS (ABSOLUTE): 0.2 10*3/uL (ref 0.0–0.4)
Eos: 2 %
Hematocrit: 38.5 % (ref 34.0–46.6)
Hemoglobin: 12.2 g/dL (ref 11.1–15.9)
Immature Grans (Abs): 0.1 10*3/uL (ref 0.0–0.1)
Immature Granulocytes: 1 %
LYMPHS ABS: 2.6 10*3/uL (ref 0.7–3.1)
LYMPHS: 27 %
MCH: 25.9 pg — AB (ref 26.6–33.0)
MCHC: 31.7 g/dL (ref 31.5–35.7)
MCV: 82 fL (ref 79–97)
MONOS ABS: 0.8 10*3/uL (ref 0.1–0.9)
Monocytes: 8 %
NEUTROS ABS: 5.9 10*3/uL (ref 1.4–7.0)
Neutrophils: 61 %
PLATELETS: 344 10*3/uL (ref 150–450)
RBC: 4.71 x10E6/uL (ref 3.77–5.28)
RDW: 15.7 % — AB (ref 12.3–15.4)
WBC: 9.7 10*3/uL (ref 3.4–10.8)

## 2017-12-25 LAB — ANGIOTENSIN CONVERTING ENZYME: Angio Convert Enzyme: 36 U/L (ref 14–82)

## 2017-12-25 LAB — D-DIMER, QUANTITATIVE (NOT AT ARMC): D-DIMER: 0.79 mg{FEU}/L — AB (ref 0.00–0.49)

## 2017-12-26 ENCOUNTER — Other Ambulatory Visit (INDEPENDENT_AMBULATORY_CARE_PROVIDER_SITE_OTHER): Payer: Self-pay | Admitting: Physician Assistant

## 2017-12-26 DIAGNOSIS — N939 Abnormal uterine and vaginal bleeding, unspecified: Secondary | ICD-10-CM

## 2017-12-28 LAB — HCG, SERUM, QUALITATIVE: HCG, BETA SUBUNIT, QUAL, SERUM: NEGATIVE m[IU]/mL (ref <6)

## 2017-12-28 LAB — SPECIMEN STATUS REPORT

## 2018-01-22 ENCOUNTER — Ambulatory Visit (INDEPENDENT_AMBULATORY_CARE_PROVIDER_SITE_OTHER): Payer: BLUE CROSS/BLUE SHIELD | Admitting: Physician Assistant

## 2018-04-25 DIAGNOSIS — M351 Other overlap syndromes: Secondary | ICD-10-CM | POA: Diagnosis not present

## 2018-04-25 DIAGNOSIS — M1990 Unspecified osteoarthritis, unspecified site: Secondary | ICD-10-CM | POA: Diagnosis not present

## 2018-04-25 DIAGNOSIS — R768 Other specified abnormal immunological findings in serum: Secondary | ICD-10-CM | POA: Diagnosis not present

## 2018-04-25 DIAGNOSIS — M064 Inflammatory polyarthropathy: Secondary | ICD-10-CM | POA: Diagnosis not present

## 2018-04-25 DIAGNOSIS — M222X1 Patellofemoral disorders, right knee: Secondary | ICD-10-CM | POA: Diagnosis not present

## 2018-04-25 DIAGNOSIS — Z09 Encounter for follow-up examination after completed treatment for conditions other than malignant neoplasm: Secondary | ICD-10-CM | POA: Diagnosis not present

## 2018-04-25 DIAGNOSIS — M7711 Lateral epicondylitis, right elbow: Secondary | ICD-10-CM | POA: Diagnosis not present

## 2018-04-25 DIAGNOSIS — R809 Proteinuria, unspecified: Secondary | ICD-10-CM | POA: Diagnosis not present

## 2018-04-26 ENCOUNTER — Other Ambulatory Visit: Payer: Self-pay

## 2018-04-26 ENCOUNTER — Encounter (HOSPITAL_COMMUNITY): Payer: Self-pay | Admitting: *Deleted

## 2018-04-26 ENCOUNTER — Emergency Department (HOSPITAL_COMMUNITY): Payer: BLUE CROSS/BLUE SHIELD

## 2018-04-26 ENCOUNTER — Emergency Department (HOSPITAL_COMMUNITY)
Admission: EM | Admit: 2018-04-26 | Discharge: 2018-04-26 | Disposition: A | Discharge status: Home / Self Care | Payer: BLUE CROSS/BLUE SHIELD | Attending: Emergency Medicine | Admitting: Emergency Medicine

## 2018-04-26 DIAGNOSIS — R42 Dizziness and giddiness: Secondary | ICD-10-CM | POA: Insufficient documentation

## 2018-04-26 DIAGNOSIS — Z87891 Personal history of nicotine dependence: Secondary | ICD-10-CM | POA: Insufficient documentation

## 2018-04-26 DIAGNOSIS — I10 Essential (primary) hypertension: Secondary | ICD-10-CM | POA: Insufficient documentation

## 2018-04-26 DIAGNOSIS — J45909 Unspecified asthma, uncomplicated: Secondary | ICD-10-CM | POA: Diagnosis not present

## 2018-04-26 DIAGNOSIS — Z79899 Other long term (current) drug therapy: Secondary | ICD-10-CM | POA: Insufficient documentation

## 2018-04-26 DIAGNOSIS — R079 Chest pain, unspecified: Secondary | ICD-10-CM | POA: Diagnosis not present

## 2018-04-26 LAB — BASIC METABOLIC PANEL
ANION GAP: 4 — AB (ref 5–15)
BUN: 10 mg/dL (ref 6–20)
CO2: 25 mmol/L (ref 22–32)
Calcium: 8.7 mg/dL — ABNORMAL LOW (ref 8.9–10.3)
Chloride: 107 mmol/L (ref 98–111)
Creatinine, Ser: 0.75 mg/dL (ref 0.44–1.00)
GFR calc non Af Amer: >60 mL/min (ref >60)
Glucose, Bld: 86 mg/dL (ref 70–99)
Potassium: 3.6 mmol/L (ref 3.5–5.1)
Sodium: 136 mmol/L (ref 135–145)

## 2018-04-26 LAB — I-STAT TROPONIN, ED: Troponin i, poc: 0 ng/mL (ref 0.00–0.08)

## 2018-04-26 LAB — CBC
HCT: 38.7 % (ref 36.0–46.0)
Hemoglobin: 12.2 g/dL (ref 12.0–15.0)
MCH: 25.9 pg — ABNORMAL LOW (ref 26.0–34.0)
MCHC: 31.5 g/dL (ref 30.0–36.0)
MCV: 82.2 fL (ref 80.0–100.0)
NRBC: 0 % (ref 0.0–0.2)
PLATELETS: 342 10*3/uL (ref 150–400)
RBC: 4.71 MIL/uL (ref 3.87–5.11)
RDW: 16 % — ABNORMAL HIGH (ref 11.5–15.5)
WBC: 12.4 10*3/uL — ABNORMAL HIGH (ref 4.0–10.5)

## 2018-04-26 LAB — POCT I-STAT TROPONIN I: Troponin i, poc: 0 ng/mL (ref 0.00–0.08)

## 2018-04-26 LAB — I-STAT BETA HCG BLOOD, ED (MC, WL, AP ONLY)

## 2018-04-26 MED ORDER — SODIUM CHLORIDE 0.9% FLUSH: 3.0000 mL | Freq: Once | INTRAVENOUS | Status: DC

## 2018-04-26 MED ORDER — MECLIZINE HCL 25 MG PO TABS
25.0000 mg | ORAL_TABLET | Freq: Once | ORAL | Status: AC
  Administered 2018-04-26: 25 mg via ORAL
  Filled 2018-04-26: qty 1

## 2018-04-26 MED ORDER — SODIUM CHLORIDE 0.9 % IV BOLUS
1000.0000 mL | Freq: Once | INTRAVENOUS | Status: AC
  Administered 2018-04-26: 1000 mL via INTRAVENOUS

## 2018-04-26 NOTE — Discharge Instructions (Addendum)
Follow-up with your primary care provider. Return to the ED for worsening symptoms, worsening headache, blurry vision, injuries or falls, numbness in arms or legs or shortness of breath.

## 2018-04-26 NOTE — ED Notes (Signed)
Pt ambulated without assistance for 100 plus feet. Pt maintain O2 saturation of 100% and a HR between 90 and 100 bpm. Pt did complain of some dizziness but did not affect gait

## 2018-04-26 NOTE — ED Triage Notes (Signed)
Pt reports pain under her L breast intermittently x 3 weeks with dizziness.  Pain is non-radiating.  No sob, cough, fever, or n/v.  She reports the dizziness have been occurring daily.  Her PCP can't see her until 2 weeks.  She is A&Ox 4, in NAD.

## 2018-04-26 NOTE — ED Notes (Signed)
Pt able to ambulate without any assistance.  Pt states that she feels better.

## 2018-04-26 NOTE — ED Provider Notes (Signed)
[facility]-EMERGENCY DEPT Provider Note   CSN: [id number] Arrival date & time: 04/26/18  1417    History   Chief Complaint Chief Complaint  Patient presents with  . Chest Pain    HPI Natalie Chase is a 38 y.o. female with a past medical history of asthma, hypertension, who presents to ED for evaluation of dizziness for the past 3 weeks.  She states has a sensation of the room spinning around her anytime she sits, lays down or attempts to ambulate.  She reports left-sided chest pain that has been intermittent for the past several months but she states that "I always thought that was because of my high blood pressure."  She denies chest pain currently.  She cannot recall any inciting event that may have triggered the dizziness with the exception of beginning to exercise about 1.5 months ago.  She reports headache for the past month.  She has had blurry vision for the past 4 months which she attributes to her use of Plaquenil.  She denies any head injuries, falls, numbness in arms or legs, vomiting, diarrhea, abdominal pain, fever, shortness of breath, hemoptysis, prior MI, DVT, PE, recent immobilization or OCP use.     HPI  Past Medical History:  Diagnosis Date  . Asthma   . Hypertension   . Sickle cell trait (HCC)   . Vaginal Pap smear, abnormal     Patient Active Problem List   Diagnosis Date Noted  . Essential hypertension 06/11/2017  . 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 ASCUS with positive high risk HPV cervical 10/29/2014    Past Surgical History:  Procedure Laterality Date  . CHOLECYSTECTOMY       OB History    Gravida  1   Para  1   Term  1   Preterm      AB      Living  1     SAB      TAB      Ectopic      Multiple      Live Births  1            Home Medications    Prior to Admission medications   Medication Sig Start Date End Date Taking? Authorizing Provider  azaTHIOprine (IMURAN) 50 MG tablet  Take 100 mg by mouth daily.   Yes [provider]  cholecalciferol (VITAMIN D) 1000 UNITS tablet Take 2,000 Units by mouth daily.   Yes [provider]  hydrochlorothiazide (HYDRODIURIL) 25 MG tablet Take 1 tablet (25 mg total) by mouth daily. 07/17/17 04/26/18 Yes Claiborne Rigg, NP  hydroxychloroquine (PLAQUENIL) 200 MG tablet Take 400 mg by mouth. 09/18/16  Yes [provider]  albuterol (PROVENTIL HFA;VENTOLIN HFA) 108 (90 Base) MCG/ACT inhaler Inhale 1-2 puffs into the lungs every 6 (six) hours as needed for wheezing or shortness of breath. Patient not taking: Reported on 04/26/2018 03/22/15   Barrett, Rolm Gala, PA-C  ferrous sulfate 325 (65 FE) MG tablet Take 1 tablet (325 mg total) by mouth daily with breakfast. Patient not taking: Reported on 04/26/2018 07/17/17 12/24/17  Claiborne Rigg, NP  Prenatal MV-Min-Fe Fum-FA-DHA [facility] COMPLETE) 27-1 & 300 MG MISC Take 1 tablet daily by mouth. Patient not taking: Reported on 04/26/2018 01/01/17   Loletta Specter, PA-C    Family History Family History  Problem Relation Age of Onset  . Achondroplasia Mother   . Lupus Mother   . Sickle cell  anemia Mother   . Hypertension Father   . Stroke Father   . Cancer Other     Social History Social History   Tobacco Use  . Smoking status: Former Smoker    Packs/day: 0.25    Types: Cigarettes    Last attempt to quit: 01/09/2017    Years since quitting: 1.2  . Smokeless tobacco: Former Engineer, water Use Topics  . Alcohol use: Not Currently  . Drug use: Yes    Types: Marijuana     Allergies   Patient has no known allergies.   Review of Systems Review of Systems  Constitutional: Negative for appetite change, chills and fever.  HENT: Negative for ear pain, rhinorrhea, sneezing and sore throat.   Eyes: Negative for photophobia and visual disturbance.  Respiratory: Negative for cough, chest tightness, shortness of breath and wheezing.   Cardiovascular:  Positive for chest pain. Negative for palpitations.  Gastrointestinal: Negative for abdominal pain, blood in stool, constipation, diarrhea, nausea and vomiting.  Genitourinary: Negative for dysuria, hematuria and urgency.  Musculoskeletal: Negative for myalgias.  Skin: Negative for rash.  Neurological: Positive for dizziness and headaches. Negative for weakness and light-headedness.     Physical Exam Updated Vital Signs BP (!) 163/94   Pulse 83   Temp 99.3 F (37.4 C) (Oral)   Resp (!) 25   LMP 04/05/2018   SpO2 99%   Physical Exam Vitals signs and nursing note reviewed.  Constitutional:      General: She is not in acute distress.    Appearance: She is well-developed.  HENT:     Head: Normocephalic and atraumatic.     Nose: Nose normal.  Eyes:     General: No scleral icterus.       Right eye: No discharge.        Left eye: No discharge.     Extraocular Movements:     Right eye: Nystagmus (Horizontal) present.     Left eye: Nystagmus (Horizontal) present.     Conjunctiva/sclera: Conjunctivae normal.  Neck:     Musculoskeletal: Normal range of motion and neck supple.  Cardiovascular:     Rate and Rhythm: Normal rate and regular rhythm.     Heart sounds: Normal heart sounds. No murmur. No friction rub. No gallop.   Pulmonary:     Effort: Pulmonary effort is normal. No respiratory distress.     Breath sounds: Normal breath sounds.  Abdominal:     General: Bowel sounds are normal. There is no distension.     Palpations: Abdomen is soft.     Tenderness: There is no abdominal tenderness. There is no guarding.  Musculoskeletal: Normal range of motion.     Comments: No lower extremity edema, erythema or calf tenderness bilaterally.  Skin:    General: Skin is warm and dry.     Findings: No rash.  Neurological:     General: No focal deficit present.     Mental Status: She is alert and oriented to person, place, and time.     Cranial Nerves: No cranial nerve deficit.      Sensory: No sensory deficit.     Motor: No weakness or abnormal muscle tone.     Coordination: Coordination normal.     Comments: Pupils reactive. No facial asymmetry noted. Cranial nerves appear grossly intact. Sensation intact to light touch on face, BUE and BLE. Strength 5/5 in BUE and BLE. Normal finger to nose coordination bilaterally.  ED Treatments / Results  Labs (all labs ordered are listed, but only abnormal results are displayed) Labs Reviewed  BASIC METABOLIC PANEL - Abnormal; Notable for the following components:      Result Value   Calcium 8.7 (*)    Anion gap 4 (*)    All other components within normal limits  CBC - Abnormal; Notable for the following components:   WBC 12.4 (*)    MCH 25.9 (*)    RDW 16.0 (*)    All other components within normal limits  I-STAT TROPONIN, ED  I-STAT BETA HCG BLOOD, ED ([facility], [facility], [facility] ONLY)  I-STAT TROPONIN, ED  POCT I-STAT TROPONIN I    EKG EKG Interpretation  Date/Time:  Friday April 26 2018 17:33:58 EST Ventricular Rate:  93 PR Interval:    QRS Duration: 75 QT Interval:  359 QTC Calculation: 447 R Axis:   82 Text Interpretation:  Sinus rhythm Confirmed by Kennis Carina [phone]) on 04/26/2018 8:08:36 PM   Radiology Dg Chest 2 View  Result Date: 04/26/2018 CLINICAL DATA:  Chest pain and dizziness EXAM: CHEST - 2 VIEW COMPARISON:  February 20, 2016 FINDINGS: Lungs are clear. Heart size and pulmonary vascularity are normal. No adenopathy. No pneumothorax. No bone lesions. IMPRESSION: No edema or consolidation. Electronically Signed   By: Bretta Bang III M.D.   On: 04/26/2018 15:31   Ct Head Wo Contrast  Result Date: 04/26/2018 CLINICAL DATA:  Severe headache and dizziness. EXAM: CT HEAD WITHOUT CONTRAST TECHNIQUE: Contiguous axial images were obtained from the base of the skull through the vertex without intravenous contrast. COMPARISON:  None. FINDINGS: Brain: There is no evidence of acute infarct, intracranial  hemorrhage, mass, midline shift, or extra-axial fluid collection. The ventricles and sulci are normal. Vascular: No hyperdense vessel. Skull: No fracture or focal osseous lesion. Sinuses/Orbits: Visualized paranasal sinuses and mastoid air cells are clear. Orbits are unremarkable. Other: None. IMPRESSION: Negative head CT. Electronically Signed   By: Sebastian Ache M.D.   On: 04/26/2018 19:23    Procedures Procedures (including critical care time)  Medications Ordered in ED Medications  sodium chloride flush (NS) 0.9 % injection 3 mL ( Intravenous Canceled Entry 04/26/18 1804)  sodium chloride 0.9 % bolus 1,000 mL (1,000 mLs Intravenous Bolus 04/26/18 1804)  meclizine (ANTIVERT) tablet 25 mg (25 mg Oral Given 04/26/18 1759)     Initial Impression / Assessment and Plan / ED Course  I have reviewed the triage vital signs and the nursing notes.  Pertinent labs & imaging results that were available during my care of the patient were reviewed by me and considered in my medical decision making (see chart for details).        38yo F with a past medical history of HTN presents to ED for intermittent dizziness for the past 3 weeks. She notes intermittent chest pain that she associates with her HTN, as this has been going on for several months. She denies chest pain currently. She describes dizziness as sensation of room spinning around her when she sits down, ambulates. This is new for her, and the only inciting event she can think of is beginning to exercise 1.5 months ago. She notes headache for the past month, has not taken any medications to help with her symptoms. No nausea or vomiting, head injuries or falls, changes to sensation, fever or history of aneurysms. She does note a family history of brain tumors (mother). On my exam there are no deficits on neurological exam  noted. She has horizontal nystagmus bilaterally, with no changes to sensation or facial asymmetry noted. Lungs are clear to  auscultation bilaterally. Vital signs are reassuring, with the exception of hypertension. Will obtain labwork, CT head, CXR, give fluids and meclizine and reassess.   8:06 PM Patient reports improvement in her symptoms with fluids and meclizine.  Lab work including initial and delta troponin, CBC, BMP, chest x-ray, CT of the head is unremarkable.  EKG shows normal sinus rhythm.  Patient ambulatory without difficulty prior to discharge.  Suspect that her symptoms are due to vertigo, doubt CVA or other intracranial emergency as the cause of her symptoms. Patient continues to deny chest pain, I doubt PE as she is PERC negative and low risk by HEART score (1= risk factors). There are no headache characteristics that are lateralizing or concerning for increased ICP, infectious or vascular cause of her symptoms.  We will have her follow-up with PCP and give prescription for meclizine PRN.   Patient is hemodynamically stable, in NAD, and able to ambulate in the ED. Evaluation does not show pathology that would require ongoing emergent intervention or inpatient treatment. I explained the diagnosis to the patient. Pain has been managed and has no complaints prior to discharge. Patient is comfortable with above plan and is stable for discharge at this time. All questions were answered prior to disposition. Strict return precautions for returning to the ED were discussed. Encouraged follow up with PCP.    Portions of this note were generated with Scientist, clinical (histocompatibility and immunogenetics)Dragon dictation software. Dictation errors may occur despite best attempts at proofreading.  Final Clinical Impressions(s) / ED Diagnoses   Final diagnoses:  Vertigo    ED Discharge Orders    None       Dietrich PatesKhatri, Merilynn Haydu, PA-C 04/26/18 2011    Sabas SousBero, Michael M, MD 04/26/18 2244

## 2018-05-22 ENCOUNTER — Inpatient Hospital Stay (INDEPENDENT_AMBULATORY_CARE_PROVIDER_SITE_OTHER): Payer: BLUE CROSS/BLUE SHIELD | Admitting: Primary Care

## 2018-07-26 ENCOUNTER — Telehealth: Payer: BLUE CROSS/BLUE SHIELD

## 2018-07-29 ENCOUNTER — Encounter: Payer: Self-pay | Admitting: Physician Assistant

## 2018-07-29 ENCOUNTER — Telehealth: Payer: Self-pay

## 2018-07-29 ENCOUNTER — Telehealth: Payer: BLUE CROSS/BLUE SHIELD | Admitting: Physician Assistant

## 2018-07-29 ENCOUNTER — Other Ambulatory Visit: Payer: Self-pay

## 2018-07-29 DIAGNOSIS — B9689 Other specified bacterial agents as the cause of diseases classified elsewhere: Secondary | ICD-10-CM

## 2018-07-29 DIAGNOSIS — N898 Other specified noninflammatory disorders of vagina: Secondary | ICD-10-CM

## 2018-07-29 DIAGNOSIS — N760 Acute vaginitis: Secondary | ICD-10-CM

## 2018-07-29 MED ORDER — METRONIDAZOLE 500 MG PO TABS: 500.0000 mg | ORAL_TABLET | Freq: Two times a day (BID) | ORAL | 0 refills | Status: DC

## 2018-07-29 NOTE — Progress Notes (Signed)

## 2018-07-29 NOTE — Telephone Encounter (Signed)
Pt called Nurse Line stating that she is having symptoms of BV, requesting Rx. So sent Flagyl to Pharmacy on file. Pt verbalized understanding.

## 2018-08-03 ENCOUNTER — Encounter (HOSPITAL_COMMUNITY): Payer: Self-pay

## 2018-08-03 ENCOUNTER — Other Ambulatory Visit: Payer: Self-pay

## 2018-08-03 ENCOUNTER — Emergency Department (HOSPITAL_COMMUNITY)
Admission: EM | Admit: 2018-08-03 | Discharge: 2018-08-03 | Disposition: A | Discharge status: Home / Self Care | Payer: BLUE CROSS/BLUE SHIELD | Attending: Emergency Medicine | Admitting: Emergency Medicine

## 2018-08-03 DIAGNOSIS — B3731 Acute candidiasis of vulva and vagina: Secondary | ICD-10-CM

## 2018-08-03 DIAGNOSIS — Z79899 Other long term (current) drug therapy: Secondary | ICD-10-CM | POA: Diagnosis not present

## 2018-08-03 DIAGNOSIS — J45909 Unspecified asthma, uncomplicated: Secondary | ICD-10-CM | POA: Diagnosis not present

## 2018-08-03 DIAGNOSIS — N898 Other specified noninflammatory disorders of vagina: Secondary | ICD-10-CM | POA: Diagnosis present

## 2018-08-03 DIAGNOSIS — I10 Essential (primary) hypertension: Secondary | ICD-10-CM | POA: Diagnosis not present

## 2018-08-03 DIAGNOSIS — B373 Candidiasis of vulva and vagina: Secondary | ICD-10-CM | POA: Insufficient documentation

## 2018-08-03 DIAGNOSIS — Z87891 Personal history of nicotine dependence: Secondary | ICD-10-CM | POA: Insufficient documentation

## 2018-08-03 LAB — I-STAT BETA HCG BLOOD, ED (MC, WL, AP ONLY): I-stat hCG, quantitative: <5 m[IU]/mL (ref <5)

## 2018-08-03 LAB — WET PREP, GENITAL
Clue Cells Wet Prep HPF POC: NONE SEEN
Sperm: NONE SEEN
Trich, Wet Prep: NONE SEEN
Yeast Wet Prep HPF POC: NONE SEEN

## 2018-08-03 MED ORDER — FLUCONAZOLE 150 MG PO TABS: 150.0000 mg | ORAL_TABLET | Freq: Every day | ORAL | 1 refills | Status: AC

## 2018-08-03 MED ORDER — FLUCONAZOLE 150 MG PO TABS
150.0000 mg | ORAL_TABLET | Freq: Once | ORAL | Status: AC
  Administered 2018-08-03: 150 mg via ORAL
  Filled 2018-08-03: qty 1

## 2018-08-03 MED ORDER — CLOTRIMAZOLE 1 % VA CREA
1.0000 | TOPICAL_CREAM | Freq: Once | VAGINAL | Status: AC
  Administered 2018-08-03: 1 via VAGINAL
  Filled 2018-08-03: qty 45

## 2018-08-03 NOTE — ED Triage Notes (Signed)
Pt states having vaginal itching, swelling and pain for 2 days. Treating with Monostat with no relief. Recently dx with vaginosis, taking Flagyl. Denies fever. Denies urinary symptoms

## 2018-08-03 NOTE — ED Provider Notes (Signed)
Care assumed from [name] at shift change, please see her note for full details, but in brief Natalie Chase is a 38 y.o. female who presents with vaginal itching.  Per previous care provider exam is very much consistent with yeast, awaiting wet prep and labs but plan is to treat with clotrimazole and oral Diflucan with outpatient gynecologic follow-up.  Labs Reviewed  WET PREP, GENITAL - Abnormal; Notable for the following components:      Result Value   WBC, Wet Prep HPF POC MODERATE (*)    All other components within normal limits  RPR  HIV ANTIBODY (ROUTINE TESTING W REFLEX)  I-STAT BETA HCG BLOOD, ED ([facility], [facility], [facility] ONLY)  GC/CHLAMYDIA PROBE AMP ([place]) NOT AT [facility]   Despite wet prep showing no yeast, given that exam is classic for yeast will still plan to treat with Diflucan and clotrimazole.  Is aware that she has STD testing pending and will be called in 2 to 3 days with any positive results.  Return precautions discussed.  Discharged home in good condition.   Jacqlyn Larsen, PA-C 08/03/18 0749    Maudie Flakes, MD 08/07/18 [phone]

## 2018-08-03 NOTE — Discharge Instructions (Addendum)
You have been given a prescription for one Diflucan tablet with one refill. Take one tablet 24 hours after you have completed your Flagyl prescription. If still having symptoms 3 days after that dose, refill the prescription at that time.   Follow up with your doctor for further management if symptoms persist.

## 2018-08-03 NOTE — ED Provider Notes (Signed)
[facility] DEPT Provider Note   CSN: [id number] Arrival date & time: 08/03/18  0551    History   Chief Complaint Chief Complaint  Patient presents with  . Vaginal Itching    HPI Natalie Chase is a 38 y.o. female.     Patient with a history of HTN, asthma presents with vaginal itching for the past 2 days, getting progressively worse. No fever, abdominal pain, nausea or urinary symptoms. She has been on Flagyl for diagnosis of BV for the past 5 days. She has a vaginal discharge she describes as like "cottage cheese".  She has had vaginal yeast infections before with similar symptoms.   The history is provided by the patient. No language interpreter was used.  Vaginal Itching     Past Medical History:  Diagnosis Date  . Asthma   . Hypertension   . Sickle cell trait ([place])   . Vaginal Pap smear, abnormal     Patient Active Problem List   Diagnosis Date Noted  . Essential hypertension 06/11/2017  . 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 ASCUS with positive high risk HPV cervical 10/29/2014    Past Surgical History:  Procedure Laterality Date  . CHOLECYSTECTOMY       OB History    Gravida  1   Para  1   Term  1   Preterm      AB      Living  1     SAB      TAB      Ectopic      Multiple      Live Births  1            Home Medications    Prior to Admission medications   Medication Sig Start Date End Date Taking? Authorizing Provider  albuterol (PROVENTIL HFA;VENTOLIN HFA) 108 (90 Base) MCG/ACT inhaler Inhale 1-2 puffs into the lungs every 6 (six) hours as needed for wheezing or shortness of breath. Patient not taking: Reported on 04/26/2018 03/22/15   Barrett, Lahoma Crocker, PA-C  azaTHIOprine (IMURAN) 50 MG tablet Take 100 mg by mouth daily.    [provider]  cholecalciferol (VITAMIN D) 1000 UNITS tablet Take 2,000 Units by mouth daily.    [provider]  ferrous sulfate 325 (65 FE) MG  tablet Take 1 tablet (325 mg total) by mouth daily with breakfast. Patient not taking: Reported on 04/26/2018 07/17/17 12/24/17  Gildardo Pounds, NP  hydrochlorothiazide (HYDRODIURIL) 25 MG tablet Take 1 tablet (25 mg total) by mouth daily. 07/17/17 04/26/18  Gildardo Pounds, NP  hydroxychloroquine (PLAQUENIL) 200 MG tablet Take 400 mg by mouth. 09/18/16   [provider]  metroNIDAZOLE (FLAGYL) 500 MG tablet Take 1 tablet (500 mg total) by mouth 2 (two) times daily. 07/29/18   Woodroe Mode, MD  Prenatal MV-Min-Fe Fum-FA-DHA [facility] COMPLETE) 27-1 & 300 MG MISC Take 1 tablet daily by mouth. Patient not taking: Reported on 04/26/2018 01/01/17   Clent Demark, PA-C    Family History Family History  Problem Relation Age of Onset  . Achondroplasia Mother   . Lupus Mother   . Sickle cell anemia Mother   . Hypertension Father   . Stroke Father   . Cancer Other     Social History Social History   Tobacco Use  . Smoking status: Former Smoker    Packs/day: 0.25    Types: Cigarettes    Last attempt to quit:  01/09/2017    Years since quitting: 1.5  . Smokeless tobacco: Former Engineer, waterUser  Substance Use Topics  . Alcohol use: Not Currently  . Drug use: Yes    Types: Marijuana     Allergies   Patient has no known allergies.   Review of Systems Review of Systems  Constitutional: Negative for chills and fever.  Gastrointestinal: Negative.   Genitourinary: Positive for vaginal discharge. Negative for dysuria, genital sores and pelvic pain.  Musculoskeletal: Negative.   Neurological: Negative.      Physical Exam Updated Vital Signs BP (!) 161/117 (BP Location: Right Arm)   Pulse 93   Temp 98.7 F (37.1 C) (Oral)   Resp 15   Ht 5\' 5"  (1.651 m)   Wt 90.7 kg   SpO2 97%   BMI 33.28 kg/m   Physical Exam Constitutional:      Appearance: She is well-developed.  Neck:     Musculoskeletal: Normal range of motion.  Pulmonary:     Effort: Pulmonary effort is normal.   Genitourinary:    Comments: Vulva is erythematous, mildly swollen. There is thick, white, clumping vaginal discharge present. No CMT, adnexal tenderness or mass.  Skin:    General: Skin is warm and dry.  Neurological:     Mental Status: She is alert and oriented to person, place, and time.      ED Treatments / Results  Labs (all labs ordered are listed, but only abnormal results are displayed) Labs Reviewed - No data to display  EKG None  Radiology No results found.  Procedures Procedures (including critical care time)  Medications Ordered in ED Medications - No data to display   Initial Impression / Assessment and Plan / ED Course  I have reviewed the triage vital signs and the nursing notes.  Pertinent labs & imaging results that were available during my care of the patient were reviewed by me and considered in my medical decision making (see chart for details).        Patient to ED with c/o severe vaginal itching with vaginal discharge. On antibiotics for the past 5 days. No history of DM.  Patient appear very uncomfortable with vaginal itching. No pelvic pain. She has vaginal discharge consistent in appearance with vaginal yeast. Topical clotrimazole ordered for comfort, Diflucan 150 mg given in ED. Patient educated on the need for repeat treatment when she finishing her abx regimen.   She recently started a new relationship and is requesting STD evaluation with HIV and RPR which are obtained.    Final Clinical Impressions(s) / ED Diagnoses   Final diagnoses:  None   1. Vaginal yeast infection  ED Discharge Orders    None       Elpidio AnisUpstill, Ashish Rossetti, PA-C 08/03/18 [id number]    Ward, Layla MawKristen N, DO 08/03/18 [id number]

## 2018-08-04 LAB — HIV ANTIBODY (ROUTINE TESTING W REFLEX): HIV Screen 4th Generation wRfx: NONREACTIVE

## 2018-08-04 LAB — RPR: RPR Ser Ql: NONREACTIVE

## 2018-08-05 LAB — GC/CHLAMYDIA PROBE AMP (~~LOC~~) NOT AT ARMC
Chlamydia: NEGATIVE
Neisseria Gonorrhea: NEGATIVE

## 2018-08-07 ENCOUNTER — Ambulatory Visit (INDEPENDENT_AMBULATORY_CARE_PROVIDER_SITE_OTHER): Payer: BLUE CROSS/BLUE SHIELD | Admitting: Primary Care

## 2018-08-08 ENCOUNTER — Other Ambulatory Visit: Payer: Self-pay

## 2018-08-08 ENCOUNTER — Ambulatory Visit: Payer: BLUE CROSS/BLUE SHIELD | Attending: Primary Care | Admitting: Primary Care

## 2018-08-08 ENCOUNTER — Encounter: Payer: Self-pay | Admitting: Primary Care

## 2018-08-08 VITALS — BP 148/85 | HR 82 | Temp 98.9°F | Resp 18 | Ht 65.0 in | Wt 195.0 lb

## 2018-08-08 DIAGNOSIS — I10 Essential (primary) hypertension: Secondary | ICD-10-CM

## 2018-08-08 DIAGNOSIS — B3789 Other sites of candidiasis: Secondary | ICD-10-CM | POA: Insufficient documentation

## 2018-08-08 NOTE — Patient Instructions (Signed)
Vaginal Yeast infection, Adult    Vaginal yeast infection is a condition that causes vaginal discharge as well as soreness, swelling, and redness (inflammation) of the vagina. This is a common condition. Some women get this infection frequently.  What are the causes?  This condition is caused by a change in the normal balance of the yeast (candida) and bacteria that live in the vagina. This change causes an overgrowth of yeast, which causes the inflammation.  What increases the risk?  The condition is more likely to develop in women who:   Take antibiotic medicines.   Have diabetes.   Take birth control pills.   Are pregnant.   Douche often.   Have a weak body defense system (immune system).   Have been taking steroid medicines for a long time.   Frequently wear tight clothing.  What are the signs or symptoms?  Symptoms of this condition include:   White, thick, creamy vaginal discharge.   Swelling, itching, redness, and irritation of the vagina. The lips of the vagina (vulva) may be affected as well.   Pain or a burning feeling while urinating.   Pain during sex.  How is this diagnosed?  This condition is diagnosed based on:   Your medical history.   A physical exam.   A pelvic exam. Your health care provider will examine a sample of your vaginal discharge under a microscope. Your health care provider may send this sample for testing to confirm the diagnosis.  How is this treated?  This condition is treated with medicine. Medicines may be over-the-counter or prescription. You may be told to use one or more of the following:   Medicine that is taken by mouth (orally).   Medicine that is applied as a cream (topically).   Medicine that is inserted directly into the vagina (suppository).  Follow these instructions at home:    Lifestyle   Do not have sex until your health care provider approves. Tell your sex partner that you have a yeast infection. That person should go to his or her health care  provider and ask if they should also be treated.   Do not wear tight clothes, such as pantyhose or tight pants.   Wear breathable cotton underwear.  General instructions   Take or apply over-the-counter and prescription medicines only as told by your health care provider.   Eat more yogurt. This may help to keep your yeast infection from returning.   Do not use tampons until your health care provider approves.   Try taking a sitz bath to help with discomfort. This is a warm water bath that is taken while you are sitting down. The water should only come up to your hips and should cover your buttocks. Do this 3-4 times per day or as told by your health care provider.   Do not douche.   If you have diabetes, keep your blood sugar levels under control.   Keep all follow-up visits as told by your health care provider. This is important.  Contact a health care provider if:   You have a fever.   Your symptoms go away and then return.   Your symptoms do not get better with treatment.   Your symptoms get worse.   You have new symptoms.   You develop blisters in or around your vagina.   You have blood coming from your vagina and it is not your menstrual period.   You develop pain in your abdomen.  Summary     Vaginal yeast infection is a condition that causes discharge as well as soreness, swelling, and redness (inflammation) of the vagina.   This condition is treated with medicine. Medicines may be over-the-counter or prescription.   Take or apply over-the-counter and prescription medicines only as told by your health care provider.   Do not douche. Do not have sex or use tampons until your health care provider approves.   Contact a health care provider if your symptoms do not get better with treatment or your symptoms go away and then return.  This information is not intended to replace advice given to you by your health care provider. Make sure you discuss any questions you have with your health care  provider.  Document Released: 11/23/2004 Document Revised: 07/02/2017 Document Reviewed: 07/02/2017  Elsevier Interactive Patient Education  2019 [employer]

## 2018-08-08 NOTE — Progress Notes (Signed)
Subjective:  Patient ID: Natalie Chase, female    DOB: 1981/02/25  Age: 38 y.o. MRN: [id number]  CC: Vaginitis and Hypertension   HPI Natalie Chase presents for hospital follow for yeast infection .  Outpatient Medications Prior to Visit  Medication Sig Dispense Refill  . miconazole (MICOTIN) 100 MG vaginal suppository Place 100 mg vaginally at bedtime.    Marland Kitchen albuterol (PROVENTIL HFA;VENTOLIN HFA) 108 (90 Base) MCG/ACT inhaler Inhale 1-2 puffs into the lungs every 6 (six) hours as needed for wheezing or shortness of breath. (Patient not taking: Reported on 04/26/2018) 1 Inhaler 0  . ferrous sulfate 325 (65 FE) MG tablet Take 1 tablet (325 mg total) by mouth daily with breakfast. (Patient not taking: Reported on 08/03/2018) 90 tablet 3  . hydrochlorothiazide (HYDRODIURIL) 25 MG tablet Take 1 tablet (25 mg total) by mouth daily. (Patient not taking: Reported on 08/03/2018) 90 tablet 1  . metroNIDAZOLE (FLAGYL) 500 MG tablet Take 1 tablet (500 mg total) by mouth 2 (two) times daily. 14 tablet 0  . Prenatal MV-Min-Fe Fum-FA-DHA [facility] COMPLETE) 27-1 & 300 MG MISC Take 1 tablet daily by mouth. (Patient not taking: Reported on 04/26/2018) 273 each 0   No facility-administered medications prior to visit.     ROS Review of Systems  Constitutional: Negative.   HENT: Negative.   Eyes: Negative.   Respiratory: Negative.   Cardiovascular: Negative.   Gastrointestinal: Negative.   Endocrine: Positive for polyuria.  Genitourinary:       Vaginal itching  Musculoskeletal: Negative.   Skin:       Yeast in groin area   Allergic/Immunologic: Negative.   Neurological: Negative.   Hematological: Negative.   Psychiatric/Behavioral: Positive for sleep disturbance.    Objective:  BP (!) 148/85 (BP Location: Left Arm, Patient Position: Sitting, Cuff Size: Normal)   Pulse 82   Temp 98.9 F (37.2 C) (Oral)   Resp 18   Ht 5\' 5"  (1.651 m)   Wt 195 lb (88.5 kg)   LMP 06/12/2018   SpO2 100%   BMI  32.45 kg/m   BP/Weight 08/08/2018 08/03/2018 06/12/6061  Systolic BP 016 010 932  Diastolic BP 85 87 98  Wt. (Lbs) 195 200 -  BMI 32.45 33.28 -    Physical Exam Constitutional:      Appearance: Normal appearance.  HENT:     Head: Normocephalic.     Nose: Nose normal.  Neck:     Musculoskeletal: Normal range of motion.  Cardiovascular:     Rate and Rhythm: Normal rate.  Pulmonary:     Effort: Pulmonary effort is normal.     Breath sounds: Normal breath sounds.  Abdominal:     General: Abdomen is flat.  Musculoskeletal: Normal range of motion.  Skin:    Findings: Rash present.  Neurological:     Mental Status: She is alert and oriented to person, place, and time.      Assessment & Plan:   There are no diagnoses linked to this encounter.  Rian was seen today for vaginitis and hypertension.  Diagnoses and all orders for this visit:  Essential hypertension Bp is elevated discussed risked factors with not taking medication. We have come to medium decision where she will decrease Sodium intake and exercise. In 3 months if weight and Bp not wnl she will take Bp medications.  Candida rash of groin Seen in ED and tx rash is getting better. Advised to keep area clean and dry  Follow-up: Return in about 3 months (around 11/08/2018) for routine f/u.   Grayce SessionsMichelle P Dasani Thurlow NP

## 2018-08-12 MED ORDER — HYDROCHLOROTHIAZIDE 25 MG PO TABS: 25.0000 mg | ORAL_TABLET | Freq: Every day | ORAL | 1 refills | Status: DC

## 2018-09-04 ENCOUNTER — Ambulatory Visit (INDEPENDENT_AMBULATORY_CARE_PROVIDER_SITE_OTHER): Payer: BC Managed Care – PPO | Admitting: Primary Care

## 2018-09-04 ENCOUNTER — Encounter (INDEPENDENT_AMBULATORY_CARE_PROVIDER_SITE_OTHER): Payer: Self-pay | Admitting: Primary Care

## 2018-09-04 ENCOUNTER — Other Ambulatory Visit (HOSPITAL_COMMUNITY)
Admission: RE | Admit: 2018-09-04 | Discharge: 2018-09-04 | Disposition: A | Discharge status: Home / Self Care | Payer: BC Managed Care – PPO | Source: Ambulatory Visit | Attending: Primary Care | Admitting: Primary Care

## 2018-09-04 ENCOUNTER — Other Ambulatory Visit: Payer: Self-pay

## 2018-09-04 VITALS — BP 177/98 | HR 80 | Temp 98.8°F | Ht 65.0 in | Wt 194.2 lb

## 2018-09-04 DIAGNOSIS — N898 Other specified noninflammatory disorders of vagina: Secondary | ICD-10-CM | POA: Diagnosis not present

## 2018-09-04 DIAGNOSIS — R300 Dysuria: Secondary | ICD-10-CM

## 2018-09-04 DIAGNOSIS — T192XXA Foreign body in vulva and vagina, initial encounter: Secondary | ICD-10-CM | POA: Insufficient documentation

## 2018-09-04 DIAGNOSIS — N390 Urinary tract infection, site not specified: Secondary | ICD-10-CM

## 2018-09-04 DIAGNOSIS — R5383 Other fatigue: Secondary | ICD-10-CM

## 2018-09-04 DIAGNOSIS — N912 Amenorrhea, unspecified: Secondary | ICD-10-CM

## 2018-09-04 DIAGNOSIS — I10 Essential (primary) hypertension: Secondary | ICD-10-CM

## 2018-09-04 LAB — POCT URINALYSIS DIP (CLINITEK)
Bilirubin, UA: NEGATIVE
Glucose, UA: NEGATIVE mg/dL
Ketones, POC UA: NEGATIVE mg/dL
Leukocytes, UA: NEGATIVE
Nitrite, UA: NEGATIVE
Spec Grav, UA: 1.02 (ref 1.010–1.025)
Urobilinogen, UA: 0.2 E.U./dL
pH, UA: 5.5 (ref 5.0–8.0)

## 2018-09-04 LAB — POCT URINE PREGNANCY: Preg Test, Ur: NEGATIVE

## 2018-09-04 MED ORDER — FLUCONAZOLE 150 MG PO TABS: 150.0000 mg | ORAL_TABLET | Freq: Once | ORAL | 0 refills | Status: AC

## 2018-09-04 MED ORDER — AMLODIPINE BESYLATE 10 MG PO TABS: 10.0000 mg | ORAL_TABLET | Freq: Every day | ORAL | 3 refills | Status: DC

## 2018-09-04 NOTE — Patient Instructions (Signed)

## 2018-09-04 NOTE — Progress Notes (Signed)
Acute Office Visit  Subjective:    Patient ID: Natalie Chase, female    DOB: 1980/03/11, 38 y.o.   MRN: [id number]  Chief Complaint  Patient presents with  . Vaginitis    HPI Patient is in today for vaginal discharge and frequency and burning with urination. She also mentioned her last menstrual cycle was in June but her menstrual cycle are irregular requesting a pregnancy . Blood pressure is elevated. Denies shortness of breath, headaches, chest pain or lower extremity edema  Past Medical History:  Diagnosis Date  . Asthma   . Hypertension   . Sickle cell trait ([facility])   . Vaginal Pap smear, abnormal     Past Surgical History:  Procedure Laterality Date  . CHOLECYSTECTOMY      Family History  Problem Relation Age of Onset  . Achondroplasia Mother   . Lupus Mother   . Sickle cell anemia Mother   . Hypertension Father   . Stroke Father   . Cancer Other     Social History   Socioeconomic History  . Marital status: Single    Spouse name: Not on file  . Number of children: Not on file  . Years of education: Not on file  . Highest education level: Not on file  Occupational History  . Not on file  Social Needs  . Financial resource strain: Not on file  . Food insecurity    Worry: Not on file    Inability: Not on file  . Transportation needs    Medical: Not on file    Non-medical: Not on file  Tobacco Use  . Smoking status: Former Smoker    Packs/day: 0.00    Types: Cigarettes    Quit date: 01/09/2017    Years since quitting: 1.6  . Smokeless tobacco: Former Engineer, waterUser  Substance and Sexual Activity  . Alcohol use: Not Currently  . Drug use: Yes    Types: Marijuana  . Sexual activity: Not Currently    Birth control/protection: None  Lifestyle  . Physical activity    Days per week: Not on file    Minutes per session: Not on file  . Stress: Not on file  Relationships  . Social Musicianconnections    Talks on phone: Not on file    Gets together: Not on file   Attends religious service: Not on file    Active member of club or organization: Not on file    Attends meetings of clubs or organizations: Not on file    Relationship status: Not on file  . Intimate partner violence    Fear of current or ex partner: Not on file    Emotionally abused: Not on file    Physically abused: Not on file    Forced sexual activity: Not on file  Other Topics Concern  . Not on file  Social History Narrative  . Not on file    Outpatient Medications Prior to Visit  Medication Sig Dispense Refill  . albuterol (PROVENTIL HFA;VENTOLIN HFA) 108 (90 Base) MCG/ACT inhaler Inhale 1-2 puffs into the lungs every 6 (six) hours as needed for wheezing or shortness of breath. (Patient not taking: Reported on 04/26/2018) 1 Inhaler 0  . miconazole (MICOTIN) 100 MG vaginal suppository Place 100 mg vaginally at bedtime.     No facility-administered medications prior to visit.     No Known Allergies  Review of Systems  Constitutional: Positive for chills and malaise/fatigue.  Cardiovascular: Positive for chest pain.  Non radiating - takes aspirin daily  Genitourinary: Positive for dysuria and frequency.  Neurological: Positive for headaches.       Objective:    Physical Exam  Constitutional: She is oriented to person, place, and time. She appears well-developed and well-nourished.  Pulmonary/Chest: Effort normal and breath sounds normal.  Abdominal: Bowel sounds are normal.  Genitourinary:    Pelvic exam was performed with patient prone.     There is a foreign body in the vagina.   Musculoskeletal: Normal range of motion.  Neurological: She is oriented to person, place, and time.  Skin: Skin is warm.    BP (!) 167/93 (BP Location: Right Arm, Patient Position: Sitting, Cuff Size: Large)   Pulse 81   Temp 98.8 F (37.1 C) (Oral)   Ht 5\' 5"  (1.651 m)   Wt 194 lb 3.2 oz (88.1 kg)   LMP 07/29/2018 (Exact Date)   SpO2 99%   BMI 32.32 kg/m  Wt Readings from  Last 3 Encounters:  09/04/18 194 lb 3.2 oz (88.1 kg)  08/08/18 195 lb (88.5 kg)  08/03/18 200 lb (90.7 kg)    There are no preventive care reminders to display for this patient.  There are no preventive care reminders to display for this patient.   Lab Results  Component Value Date   TSH 1.070 05/09/2017   Lab Results  Component Value Date   WBC 12.4 (H) 04/26/2018   HGB 12.2 04/26/2018   HCT 38.7 04/26/2018   MCV 82.2 04/26/2018   PLT 342 04/26/2018   Lab Results  Component Value Date   NA 136 04/26/2018   K 3.6 04/26/2018   CO2 25 04/26/2018   GLUCOSE 86 04/26/2018   BUN 10 04/26/2018   CREATININE 0.75 04/26/2018   BILITOT <0.2 12/24/2017   ALKPHOS 55 12/24/2017   AST 11 12/24/2017   ALT 11 12/24/2017   PROT 6.6 12/24/2017   ALBUMIN 4.2 12/24/2017   CALCIUM 8.7 (L) 04/26/2018   ANIONGAP 4 (L) 04/26/2018   Lab Results  Component Value Date   CHOL 133 07/17/2017   Lab Results  Component Value Date   HDL 39 (L) 07/17/2017   Lab Results  Component Value Date   LDLCALC 74 07/17/2017   Lab Results  Component Value Date   TRIG 101 07/17/2017   Lab Results  Component Value Date   CHOLHDL 3.4 07/17/2017   Lab Results  Component Value Date   HGBA1C 5.5 05/09/2017       Assessment & Plan:   Problem List Items Addressed This Visit    None    Visit Diagnoses    Amenorrhea    -  Primary   Relevant Orders   POCT urine pregnancy    Azizi was seen today for vaginitis.  Diagnoses and all orders for this visit:  Amenorrhea -     POCT urine pregnancy -     POCT URINALYSIS DIP (CLINITEK)  Vaginal discharge -     Cervicovaginal ancillary only  Dysuria -     POCT URINALYSIS DIP (CLINITEK)  Essential hypertension Counseled on blood pressure goal of less than 130/80, low-sodium, DASH diet, medication compliance, 150 minutes of moderate intensity exercise per week. Discussed medication compliance, adverse effects.  Vaginal foreign object,  initial encounter -     Cervicovaginal ancillary only  Fatigue, unspecified type Feels tired all the time lack of motivations hx of anemia  Foods high in iron are shellfish heme iron ( can increase cholesterol  iron spinach, liver and organ meats ( can increase cholesterol) legumes, red meats also heme iron, pumpkin seeds, [place]key, broccoli, tofu , green leafy vegetables and dark chocolate. (heme iron is is well absorbed and 95% of functional iron in the body foods with high heme on or me [place]key, fish, and beef.  -     CBC with Differential -     TSH + free T4  Other orders -     amLODipine (NORVASC) 10 MG tablet; Take 1 tablet (10 mg total) by mouth daily.     No orders of the defined types were placed in this encounter.    Grayce SessionsMichelle P Baleria Wyman, NP

## 2018-09-05 LAB — CBC WITH DIFFERENTIAL/PLATELET
Basophils Absolute: 0.1 10*3/uL (ref 0.0–0.2)
Basos: 1 %
EOS (ABSOLUTE): 0.2 10*3/uL (ref 0.0–0.4)
Eos: 2 %
Hematocrit: 39.6 % (ref 34.0–46.6)
Hemoglobin: 12.5 g/dL (ref 11.1–15.9)
Immature Grans (Abs): 0 10*3/uL (ref 0.0–0.1)
Immature Granulocytes: 0 %
Lymphocytes Absolute: 2.8 10*3/uL (ref 0.7–3.1)
Lymphs: 22 %
MCH: 25 pg — ABNORMAL LOW (ref 26.6–33.0)
MCHC: 31.6 g/dL (ref 31.5–35.7)
MCV: 79 fL (ref 79–97)
Monocytes Absolute: 0.8 10*3/uL (ref 0.1–0.9)
Monocytes: 6 %
Neutrophils Absolute: 8.4 10*3/uL — ABNORMAL HIGH (ref 1.4–7.0)
Neutrophils: 69 %
Platelets: 334 10*3/uL (ref 150–450)
RBC: 5 x10E6/uL (ref 3.77–5.28)
RDW: 18.6 % — ABNORMAL HIGH (ref 11.7–15.4)
WBC: 12.3 10*3/uL — ABNORMAL HIGH (ref 3.4–10.8)

## 2018-09-05 LAB — TSH+FREE T4
Free T4: 1.27 ng/dL (ref 0.82–1.77)
TSH: 1.7 u[IU]/mL (ref 0.450–4.500)

## 2018-09-06 ENCOUNTER — Telehealth (INDEPENDENT_AMBULATORY_CARE_PROVIDER_SITE_OTHER): Payer: Self-pay

## 2018-09-06 LAB — CERVICOVAGINAL ANCILLARY ONLY
Bacterial vaginitis: NEGATIVE
Candida vaginitis: POSITIVE — AB
Chlamydia: NEGATIVE
Neisseria Gonorrhea: NEGATIVE
Trichomonas: NEGATIVE

## 2018-09-06 LAB — URINE CULTURE

## 2018-09-06 NOTE — Telephone Encounter (Signed)
-----   Message from Kerin Perna, NP sent at 09/05/2018 12:16 PM EDT ----- Labs are acceptable

## 2018-09-06 NOTE — Telephone Encounter (Signed)
Patient is aware that labs are acceptable. Nat Christen, CMA

## 2018-09-09 ENCOUNTER — Telehealth (INDEPENDENT_AMBULATORY_CARE_PROVIDER_SITE_OTHER): Payer: Self-pay

## 2018-09-09 NOTE — Telephone Encounter (Signed)
Patient is aware that urine culture normal. Only yeast. Patient treated at time of exam. Advised patient to call [facility] if symptoms fail to improve or get worse. Nat Christen, CMA

## 2018-09-09 NOTE — Telephone Encounter (Signed)
-----   Message from Kerin Perna, NP sent at 09/08/2018 11:18 AM EDT ----- Positive for yeast treated at exam

## 2018-09-28 ENCOUNTER — Encounter

## 2018-10-02 ENCOUNTER — Encounter (INDEPENDENT_AMBULATORY_CARE_PROVIDER_SITE_OTHER): Payer: Self-pay | Admitting: Primary Care

## 2018-10-02 ENCOUNTER — Ambulatory Visit (INDEPENDENT_AMBULATORY_CARE_PROVIDER_SITE_OTHER): Payer: Medicaid Other | Admitting: Primary Care

## 2018-10-02 ENCOUNTER — Other Ambulatory Visit: Payer: Self-pay

## 2018-10-02 VITALS — BP 128/87 | HR 94 | Temp 97.5°F | Ht 65.0 in | Wt 195.8 lb

## 2018-10-02 DIAGNOSIS — Z3201 Encounter for pregnancy test, result positive: Secondary | ICD-10-CM | POA: Diagnosis not present

## 2018-10-02 DIAGNOSIS — N912 Amenorrhea, unspecified: Secondary | ICD-10-CM | POA: Diagnosis not present

## 2018-10-02 DIAGNOSIS — Z3490 Encounter for supervision of normal pregnancy, unspecified, unspecified trimester: Secondary | ICD-10-CM

## 2018-10-02 DIAGNOSIS — I10 Essential (primary) hypertension: Secondary | ICD-10-CM | POA: Diagnosis not present

## 2018-10-02 LAB — POCT URINE PREGNANCY: Preg Test, Ur: POSITIVE — AB

## 2018-10-02 MED ORDER — NIFEDIPINE ER OSMOTIC RELEASE 30 MG PO TB24: 30.0000 mg | ORAL_TABLET | Freq: Every day | ORAL | 1 refills | Status: DC

## 2018-10-02 MED ORDER — PRENATAL VITAMIN PLUS LOW IRON 27-1 MG PO TABS: 27.0000 mg | ORAL_TABLET | ORAL | 1 refills | Status: AC

## 2018-10-02 NOTE — Progress Notes (Signed)
Established Patient Office Visit  Subjective:  Patient ID: Natalie Chase, female    DOB: 09-04-1980  Age: 38 y.o. MRN: [id number]  CC:  Chief Complaint  Patient presents with  . Follow-up    BP check     HPI Natalie GinsShirley Ector presents for follow up on blood pressure previous visit prescribed amlopidipine 10 mg and vast improvement. Last menstrual cycle was June and is requesting a pregnancy test.  Past Medical History:  Diagnosis Date  . Asthma   . Hypertension   . Sickle cell trait (HCC)   . Vaginal Pap smear, abnormal     Past Surgical History:  Procedure Laterality Date  . CHOLECYSTECTOMY      Family History  Problem Relation Age of Onset  . Achondroplasia Mother   . Lupus Mother   . Sickle cell anemia Mother   . Hypertension Father   . Stroke Father   . Cancer Other     Social History   Socioeconomic History  . Marital status: Single    Spouse name: Not on file  . Number of children: Not on file  . Years of education: Not on file  . Highest education level: Not on file  Occupational History  . Not on file  Social Needs  . Financial resource strain: Not on file  . Food insecurity    Worry: Not on file    Inability: Not on file  . Transportation needs    Medical: Not on file    Non-medical: Not on file  Tobacco Use  . Smoking status: Former Smoker    Packs/day: 0.00    Types: Cigarettes    Quit date: 01/09/2017    Years since quitting: 1.7  . Smokeless tobacco: Former Engineer, waterUser  Substance and Sexual Activity  . Alcohol use: Not Currently  . Drug use: Yes    Types: Marijuana  . Sexual activity: Not Currently    Birth control/protection: None  Lifestyle  . Physical activity    Days per week: Not on file    Minutes per session: Not on file  . Stress: Not on file  Relationships  . Social Musicianconnections    Talks on phone: Not on file    Gets together: Not on file    Attends religious service: Not on file    Active member of club or organization: Not  on file    Attends meetings of clubs or organizations: Not on file    Relationship status: Not on file  . Intimate partner violence    Fear of current or ex partner: Not on file    Emotionally abused: Not on file    Physically abused: Not on file    Forced sexual activity: Not on file  Other Topics Concern  . Not on file  Social History Narrative  . Not on file    Outpatient Medications Prior to Visit  Medication Sig Dispense Refill  . albuterol (PROVENTIL HFA;VENTOLIN HFA) 108 (90 Base) MCG/ACT inhaler Inhale 1-2 puffs into the lungs every 6 (six) hours as needed for wheezing or shortness of breath. 1 Inhaler 0  . amLODipine (NORVASC) 10 MG tablet Take 1 tablet (10 mg total) by mouth daily. 90 tablet 3   No facility-administered medications prior to visit.     No Known Allergies  ROS Review of Systems  Genitourinary:       Pelvic pressure      Objective:    Physical Exam  Constitutional: She is oriented to  person, place, and time. She appears well-developed and well-nourished.  HENT:  Head: Normocephalic.  Neck: Neck supple.  Cardiovascular: Normal rate and regular rhythm.  Pulmonary/Chest: Effort normal and breath sounds normal.  Abdominal: Soft. Bowel sounds are normal. She exhibits distension.  Musculoskeletal: Normal range of motion.  Neurological: She is oriented to person, place, and time.  Skin: Skin is warm.  Psychiatric: She has a normal mood and affect.    BP 128/87 (BP Location: Left Arm, Patient Position: Sitting, Cuff Size: Normal)   Pulse 94   Temp (!) 97.5 F (36.4 C) (Tympanic)   Ht 5\' 5"  (1.651 m)   Wt 195 lb 12.8 oz (88.8 kg)   LMP 08/13/2018 (Approximate)   SpO2 97%   BMI 32.58 kg/m  Wt Readings from Last 3 Encounters:  10/02/18 195 lb 12.8 oz (88.8 kg)  09/04/18 194 lb 3.2 oz (88.1 kg)  08/08/18 195 lb (88.5 kg)     Health Maintenance Due  Topic Date Due  . INFLUENZA VACCINE  09/28/2018    There are no preventive care reminders  to display for this patient.  Lab Results  Component Value Date   TSH 1.700 09/04/2018   Lab Results  Component Value Date   WBC 12.3 (H) 09/04/2018   HGB 12.5 09/04/2018   HCT 39.6 09/04/2018   MCV 79 09/04/2018   PLT 334 09/04/2018   Lab Results  Component Value Date   NA 136 04/26/2018   K 3.6 04/26/2018   CO2 25 04/26/2018   GLUCOSE 86 04/26/2018   BUN 10 04/26/2018   CREATININE 0.75 04/26/2018   BILITOT <0.2 12/24/2017   ALKPHOS 55 12/24/2017   AST 11 12/24/2017   ALT 11 12/24/2017   PROT 6.6 12/24/2017   ALBUMIN 4.2 12/24/2017   CALCIUM 8.7 (L) 04/26/2018   ANIONGAP 4 (L) 04/26/2018   Lab Results  Component Value Date   CHOL 133 07/17/2017   Lab Results  Component Value Date   HDL 39 (L) 07/17/2017   Lab Results  Component Value Date   LDLCALC 74 07/17/2017   Lab Results  Component Value Date   TRIG 101 07/17/2017   Lab Results  Component Value Date   CHOLHDL 3.4 07/17/2017   Lab Results  Component Value Date   HGBA1C 5.5 05/09/2017      Assessment & Plan:   Problem List Items Addressed This Visit    Hypertension   Relevant Medications   NIFEdipine (PROCARDIA XL) 30 MG 24 hr tablet    Other Visit Diagnoses    Amenorrhea    -  Primary   Relevant Orders   POCT urine pregnancy (Completed)   hCG, quantitative, pregnancy   Beta hCG quant (ref lab)   Pregnancy, unspecified gestational age       Relevant Orders   hCG, quantitative, pregnancy   Ambulatory referral to Gynecology   Beta hCG quant (ref lab)    Deretha was seen today for follow-up.  Diagnoses and all orders for this visit: Diagnoses and all orders for this visit:  Amenorrhea Positive pregnancy test. Referred to OB GYN and started on prenatal vitamins  -     POCT urine pregnancy -     hCG, quantitative, pregnancy -     Beta hCG quant (ref lab)  Essential hypertension Counseled on blood pressure goal of less than 130/80, low-sodium, DASH diet, medication compliance, 150  minutes of moderate intensity exercise per week. Discussed medication compliance, adverse effects. Discontinue Norvasc contraindicated  during pregnancy chanced to a [facility]- Procardia   Other orders -     NIFEdipine (PROCARDIA XL) 30 MG 24 hr tablet; Take 1 tablet (30 mg total) by mouth daily. -     Prenatal Vit-Fe Fumarate-FA (PRENATAL VITAMIN PLUS LOW IRON) 27-1 MG TABS; Take 27 mg by mouth 1 day or 1 dose for 1 dose.   Meds ordered this encounter  Medications  . NIFEdipine (PROCARDIA XL) 30 MG 24 hr tablet    Sig: Take 1 tablet (30 mg total) by mouth daily.    Dispense:  30 tablet    Refill:  1  . Prenatal Vit-Fe Fumarate-FA (PRENATAL VITAMIN PLUS LOW IRON) 27-1 MG TABS    Sig: Take 27 mg by mouth 1 day or 1 dose for 1 dose.    Dispense:  30 tablet    Refill:  1    Follow-up: Return in about 2 weeks (around 10/16/2018) for Change Bp medication .    Grayce SessionsMichelle P Gracen Ringwald, NP

## 2018-10-02 NOTE — Patient Instructions (Signed)
Eating Plan for Pregnant Women While you are pregnant, your body requires additional nutrition to help support your growing baby. You also have a higher need for some vitamins and minerals, such as folic acid, calcium, iron, and vitamin D. Eating a healthy, well-balanced diet is very important for your health and your baby's health. Your need for extra calories varies for the three 76-monthsegments of your pregnancy (trimesters). For most women, it is recommended to consume:  150 extra calories a day during the first trimester.  300 extra calories a day during the second trimester.  300 extra calories a day during the third trimester. What are tips for following this plan?   Do not try to lose weight or go on a diet during pregnancy.  Limit your overall intake of foods that have "empty calories." These are foods that have little nutritional value, such as sweets, desserts, candies, and sugar-sweetened beverages.  Eat a variety of foods (especially fruits and vegetables) to get a full range of vitamins and minerals.  Take a prenatal vitamin to help meet your additional vitamin and mineral needs during pregnancy, specifically for folic acid, iron, calcium, and vitamin D.  Remember to stay active. Ask your health care provider what types of exercise and activities are safe for you.  Practice good food safety and cleanliness. Wash your hands before you eat and after you prepare raw meat. Wash all fruits and vegetables well before peeling or eating. Taking these actions can help to prevent food-borne illnesses that can be very dangerous to your baby, such as listeriosis. Ask your health care provider for more information about listeriosis. What does 150 extra calories look like? Healthy options that provide 150 extra calories each day could be any of the following:  6-8 oz (170-230 g) of plain low-fat yogurt with  cup of berries.  1 apple with 2 teaspoons (11 g) of peanut butter.  Cut-up  vegetables with  cup (60 g) of hummus.  8 oz (230 mL) or 1 cup of low-fat chocolate milk.  1 stick of string cheese with 1 medium orange.  1 peanut butter and jelly sandwich that is made with one slice of whole-wheat bread and 1 tsp (5 g) of peanut butter. For 300 extra calories, you could eat two of those healthy options each day. What is a healthy amount of weight to gain? The right amount of weight gain for you is based on your BMI before you became pregnant. If your BMI:  Was less than 18 (underweight), you should gain 28-40 lb (13-18 kg).  Was 18-24.9 (normal), you should gain 25-35 lb (11-16 kg).  Was 25-29.9 (overweight), you should gain 15-25 lb (7-11 kg).  Was 30 or greater (obese), you should gain 11-20 lb (5-9 kg). What if I am having twins or multiples? Generally, if you are carrying twins or multiples:  You may need to eat 300-600 extra calories a day.  The recommended range for total weight gain is 25-54 lb (11-25 kg), depending on your BMI before pregnancy.  Talk with your health care provider to find out about nutritional needs, weight gain, and exercise that is right for you. What foods can I eat?  Grains All grains. Choose whole grains, such as whole-wheat bread, oatmeal, or brown rice. Vegetables All vegetables. Eat a variety of colors and types of vegetables. Remember to wash your vegetables well before peeling or eating. Fruits All fruits. Eat a variety of colors and types of fruit. Remember to wash  your fruits well before peeling or eating. Meats and other protein foods Lean meats, including chicken, [place], fish, and lean cuts of beef, veal, or pork. If you eat fish or seafood, choose options that are higher in omega-3 fatty acids and lower in mercury, such as salmon, herring, mussels, trout, sardines, pollock, shrimp, crab, and lobster. Tofu. Tempeh. Beans. Eggs. Peanut butter and other nut butters. Make sure that all meats, poultry, and eggs are cooked to  food-safe temperatures or "well-done." Two or more servings of fish are recommended each week in order to get the most benefits from omega-3 fatty acids that are found in seafood. Choose fish that are lower in mercury. You can find more information online:  [employer]Gaming.ch Dairy Pasteurized milk and milk alternatives (such as almond milk). Pasteurized yogurt and pasteurized cheese. Cottage cheese. Sour cream. Beverages Water. Juices that contain 100% fruit juice or vegetable juice. Caffeine-free teas and decaffeinated coffee. Drinks that contain caffeine are okay to drink, but it is better to avoid caffeine. Keep your total caffeine intake to less than 200 mg each day (which is 12 oz or 355 mL of coffee, tea, or soda) or the limit as told by your health care provider. Fats and oils Fats and oils are okay to include in moderation. Sweets and desserts Sweets and desserts are okay to include in moderation. Seasoning and other foods All pasteurized condiments. The items listed above may not be a complete list of recommended foods and beverages. Contact your dietitian for more options. The items listed above may not be a complete list of foods and beverages [you/your child] can eat. Contact a dietitian for more information. What foods are not recommended? Vegetables Raw (unpasteurized) vegetable juices. Fruits Unpasteurized fruit juices. Meats and other protein foods Lunch meats, bologna, hot dogs, or other deli meats. (If you must eat those meats, reheat them until they are steaming hot.) Refrigerated pat, meat spreads from a meat counter, smoked seafood that is found in the refrigerated section of a store. Raw or undercooked meats, poultry, and eggs. Raw fish, such as sushi or sashimi. Fish that have high mercury content, such as tilefish, shark, swordfish, and king mackerel. To learn more about mercury in fish, talk with your health care provider or look for online resources, such as:   [employer]aming.ch Dairy Raw (unpasteurized) milk and any foods that have raw milk in them. Soft cheeses, such as feta, queso blanco, queso fresco, Brie, Camembert cheeses, blue-veined cheeses, and Panela cheese (unless it is made with pasteurized milk, which must be stated on the label). Beverages Alcohol. Sugar-sweetened beverages, such as sodas, teas, or energy drinks. Seasoning and other foods Homemade fermented foods and drinks, such as pickles, sauerkraut, or kombucha drinks. (Store-bought pasteurized versions of these are okay.) Salads that are made in a store or deli, such as ham salad, chicken salad, egg salad, tuna salad, and seafood salad. The items listed above may not be a complete list of foods and beverages to avoid. Contact your dietitian for more information. The items listed above may not be a complete list of foods and beverages [you/your child] should avoid. Contact a dietitian for more information. Where to find more information To calculate the number of calories you need based on your height, weight, and activity level, you can use an online calculator such as:  MobileTransition.ch To calculate how much weight you should gain during pregnancy, you can use an online pregnancy weight gain calculator such as:  StreamingFood.com.cy Summary  While you  are pregnant, your body requires additional nutrition to help support your growing baby.  Eat a variety of foods, especially fruits and vegetables to get a full range of vitamins and minerals.  Practice good food safety and cleanliness. Wash your hands before you eat and after you prepare raw meat. Wash all fruits and vegetables well before peeling or eating. Taking these actions can help to prevent food-borne illnesses, such as listeriosis, that can be very dangerous to your baby.  Do not eat raw meat or fish. Do not eat fish that have high mercury content, such as tilefish, shark,  swordfish, and king mackerel. Do not eat unpasteurized (raw) dairy.  Take a prenatal vitamin to help meet your additional vitamin and mineral needs during pregnancy, specifically for folic acid, iron, calcium, and vitamin D. This information is not intended to replace advice given to you by your health care provider. Make sure you discuss any questions you have with your health care provider. Document Released: 11/28/2013 Document Revised: 06/06/2018 Document Reviewed: 11/10/2016 Elsevier Patient Education  2020 [employer] Hypertension, Adult Hypertension is another name for high blood pressure. High blood pressure forces your heart to work harder to pump blood. This can cause problems over time. There are two numbers in a blood pressure reading. There is a top number (systolic) over a bottom number (diastolic). It is best to have a blood pressure that is below 120/80. Healthy choices can help lower your blood pressure, or you may need medicine to help lower it. What are the causes? The cause of this condition is not known. Some conditions may be related to high blood pressure. What increases the risk?  Smoking.  Having type 2 diabetes mellitus, high cholesterol, or both.  Not getting enough exercise or physical activity.  Being overweight.  Having too much fat, sugar, calories, or salt (sodium) in your diet.  Drinking too much alcohol.  Having long-term (chronic) kidney disease.  Having a family history of high blood pressure.  Age. Risk increases with age.  Race. You may be at higher risk if you are African American.  Gender. Men are at higher risk than women before age 36. After age 36, women are at higher risk than men.  Having obstructive sleep apnea.  Stress. What are the signs or symptoms?  High blood pressure may not cause symptoms. Very high blood pressure (hypertensive crisis) may cause: ? Headache. ? Feelings of worry or nervousness (anxiety). ? Shortness of  breath. ? Nosebleed. ? A feeling of being sick to your stomach (nausea). ? Throwing up (vomiting). ? Changes in how you see. ? Very bad chest pain. ? Seizures. How is this treated?  This condition is treated by making healthy lifestyle changes, such as: ? Eating healthy foods. ? Exercising more. ? Drinking less alcohol.  Your health care provider may prescribe medicine if lifestyle changes are not enough to get your blood pressure under control, and if: ? Your top number is above 130. ? Your bottom number is above 80.  Your personal target blood pressure may vary. Follow these instructions at home: Eating and drinking   If told, follow the DASH eating plan. To follow this plan: ? Fill one half of your plate at each meal with fruits and vegetables. ? Fill one fourth of your plate at each meal with whole grains. Whole grains include whole-wheat pasta, brown rice, and whole-grain bread. ? Eat or drink low-fat dairy products, such as skim milk or low-fat yogurt. ? Fill  one fourth of your plate at each meal with low-fat (lean) proteins. Low-fat proteins include fish, chicken without skin, eggs, beans, and tofu. ? Avoid fatty meat, cured and processed meat, or chicken with skin. ? Avoid pre-made or processed food.  Eat less than 1,500 mg of salt each day.  Do not drink alcohol if: ? Your doctor tells you not to drink. ? You are pregnant, may be pregnant, or are planning to become pregnant.  If you drink alcohol: ? Limit how much you use to:  0-1 drink a day for women.  0-2 drinks a day for men. ? Be aware of how much alcohol is in your drink. In the [place] one drink equals one 12 oz bottle of beer (355 mL), one 5 oz glass of wine (148 mL), or one 1 oz glass of hard liquor (44 mL). Lifestyle   Work with your doctor to stay at a healthy weight or to lose weight. Ask your doctor what the best weight is for you.  Get at least 30 minutes of exercise most days of the week. This  may include walking, swimming, or biking.  Get at least 30 minutes of exercise that strengthens your muscles (resistance exercise) at least 3 days a week. This may include lifting weights or doing Pilates.  Do not use any products that contain nicotine or tobacco, such as cigarettes, e-cigarettes, and chewing tobacco. If you need help quitting, ask your doctor.  Check your blood pressure at home as told by your doctor.  Keep all follow-up visits as told by your doctor. This is important. Medicines  Take over-the-counter and prescription medicines only as told by your doctor. Follow directions carefully.  Do not skip doses of blood pressure medicine. The medicine does not work as well if you skip doses. Skipping doses also puts you at risk for problems.  Ask your doctor about side effects or reactions to medicines that you should watch for. Contact a doctor if you:  Think you are having a reaction to the medicine you are taking.  Have headaches that keep coming back (recurring).  Feel dizzy.  Have swelling in your ankles.  Have trouble with your vision. Get help right away if you:  Get a very bad headache.  Start to feel mixed up (confused).  Feel weak or numb.  Feel faint.  Have very bad pain in your: ? Chest. ? Belly (abdomen).  Throw up more than once.  Have trouble breathing. Summary  Hypertension is another name for high blood pressure.  High blood pressure forces your heart to work harder to pump blood.  For most people, a normal blood pressure is less than 120/80.  Making healthy choices can help lower blood pressure. If your blood pressure does not get lower with healthy choices, you may need to take medicine. This information is not intended to replace advice given to you by your health care provider. Make sure you discuss any questions you have with your health care provider. Document Released: 08/02/2007 Document Revised: 10/24/2017 Document Reviewed:  10/24/2017 Elsevier Patient Education  2020 [employer].

## 2018-10-03 ENCOUNTER — Telehealth (INDEPENDENT_AMBULATORY_CARE_PROVIDER_SITE_OTHER): Payer: Self-pay

## 2018-10-03 LAB — BETA HCG QUANT (REF LAB): hCG Quant: 49092 m[IU]/mL

## 2018-10-03 NOTE — Telephone Encounter (Signed)
-----   Message from Kerin Perna, NP sent at 10/03/2018  8:44 AM EDT ----- Patient is approximately 7 weeks gestional

## 2018-10-03 NOTE — Telephone Encounter (Signed)
Patient is aware that she is approximately seven weeks pregnant. Nat Christen, CMA

## 2018-10-16 ENCOUNTER — Ambulatory Visit (INDEPENDENT_AMBULATORY_CARE_PROVIDER_SITE_OTHER): Payer: Medicaid Other | Admitting: Primary Care

## 2018-10-16 ENCOUNTER — Other Ambulatory Visit: Payer: Self-pay

## 2018-10-16 ENCOUNTER — Encounter (INDEPENDENT_AMBULATORY_CARE_PROVIDER_SITE_OTHER): Payer: Self-pay | Admitting: Primary Care

## 2018-10-16 VITALS — BP 150/83 | HR 75 | Temp 97.8°F | Ht 65.0 in | Wt 196.6 lb

## 2018-10-16 DIAGNOSIS — D573 Sickle-cell trait: Secondary | ICD-10-CM | POA: Diagnosis not present

## 2018-10-16 DIAGNOSIS — I10 Essential (primary) hypertension: Secondary | ICD-10-CM

## 2018-10-16 DIAGNOSIS — Z3490 Encounter for supervision of normal pregnancy, unspecified, unspecified trimester: Secondary | ICD-10-CM | POA: Diagnosis not present

## 2018-10-16 MED ORDER — NIFEDIPINE ER 60 MG PO TB24: 30.0000 mg | ORAL_TABLET | Freq: Every day | ORAL | 1 refills | Status: DC

## 2018-10-16 NOTE — Patient Instructions (Signed)
Preeclampsia and Eclampsia °Preeclampsia is a serious condition that may develop during pregnancy. This condition causes high blood pressure and increased protein in your urine along with other symptoms, such as headaches and vision changes. These symptoms may develop as the condition gets worse. Preeclampsia may occur at 20 weeks of pregnancy or later. °Diagnosing and treating preeclampsia early is very important. If not treated early, it can cause serious problems for you and your baby. One problem it can lead to is eclampsia. Eclampsia is a condition that causes muscle jerking or shaking (convulsions or seizures) and other serious problems for the mother. During pregnancy, delivering your baby may be the best treatment for preeclampsia or eclampsia. For most women, preeclampsia and eclampsia symptoms go away after giving birth. °In rare cases, a woman may develop preeclampsia after giving birth (postpartum preeclampsia). This usually occurs within 48 hours after childbirth but may occur up to 6 weeks after giving birth. °What are the causes? °The cause of preeclampsia is not known. °What increases the risk? °The following risk factors make you more likely to develop preeclampsia: °· Being pregnant for the first time. °· Having had preeclampsia during a past pregnancy. °· Having a family history of preeclampsia. °· Having high blood pressure. °· Being pregnant with more than one baby. °· Being 35 or older. °· Being African-American. °· Having kidney disease or diabetes. °· Having medical conditions such as lupus or blood diseases. °· Being very overweight (obese). °What are the signs or symptoms? °The most common symptoms are: °· Severe headaches. °· Vision problems, such as blurred or double vision. °· Abdominal pain, especially upper abdominal pain. °Other symptoms that may develop as the condition gets worse include: °· Sudden weight gain. °· Sudden swelling of the hands, face, legs, and feet. °· Severe nausea  and vomiting. °· Numbness in the face, arms, legs, and feet. °· Dizziness. °· Urinating less than usual. °· Slurred speech. °· Convulsions or seizures. °How is this diagnosed? °There are no screening tests for preeclampsia. Your health care provider will ask you about symptoms and check for signs of preeclampsia during your prenatal visits. You may also have tests that include: °· Checking your blood pressure. °· Urine tests to check for protein. Your health care provider will check for this at every prenatal visit. °· Blood tests. °· Monitoring your baby's heart rate. °· Ultrasound. °How is this treated? °You and your health care provider will determine the treatment approach that is best for you. Treatment may include: °· Having more frequent prenatal exams to check for signs of preeclampsia, if you have an increased risk for preeclampsia. °· Medicine to lower your blood pressure. °· Staying in the hospital, if your condition is severe. There, treatment will focus on controlling your blood pressure and the amount of fluids in your body (fluid retention). °· Taking medicine (magnesium sulfate) to prevent seizures. This may be given as an injection or through an IV. °· Taking a low-dose aspirin during your pregnancy. °· Delivering your baby early. You may have your labor started with medicine (induced), or you may have a cesarean delivery. °Follow these instructions at home: °Eating and drinking ° °· Drink enough fluid to keep your urine pale yellow. °· Avoid caffeine. °Lifestyle °· Do not use any products that contain nicotine or tobacco, such as cigarettes and e-cigarettes. If you need help quitting, ask your health care provider. °· Do not use alcohol or drugs. °· Avoid stress as much as possible. Rest and get   plenty of sleep. °General instructions °· Take over-the-counter and prescription medicines only as told by your health care provider. °· When lying down, lie on your left side. This keeps pressure off your  major blood vessels. °· When sitting or lying down, raise (elevate) your feet. Try putting some pillows underneath your lower legs. °· Exercise regularly. Ask your health care provider what kinds of exercise are best for you. °· Keep all follow-up and prenatal visits as told by your health care provider. This is important. °How is this prevented? °There is no known way of preventing preeclampsia or eclampsia from developing. However, to lower your risk of complications and detect problems early: °· Get regular prenatal care. Your health care provider may be able to diagnose and treat the condition early. °· Maintain a healthy weight. Ask your health care provider for help managing weight gain during pregnancy. °· Work with your health care provider to manage any long-term (chronic) health conditions you have, such as diabetes or kidney problems. °· You may have tests of your blood pressure and kidney function after giving birth. °· Your health care provider may have you take low-dose aspirin during your next pregnancy. °Contact a health care provider if: °· You have symptoms that your health care provider told you may require more treatment or monitoring, such as: °? Headaches. °? Nausea or vomiting. °? Abdominal pain. °? Dizziness. °? Light-headedness. °Get help right away if: °· You have severe: °? Abdominal pain. °? Headaches that do not get better. °? Dizziness. °? Vision problems. °? Confusion. °? Nausea or vomiting. °· You have any of the following: °? A seizure. °? Sudden, rapid weight gain. °? Sudden swelling in your hands, ankles, or face. °? Trouble moving any part of your body. °? Numbness in any part of your body. °? Trouble speaking. °? Abnormal bleeding. °· You faint. °Summary °· Preeclampsia is a serious condition that may develop during pregnancy. °· This condition causes high blood pressure and increased protein in your urine along with other symptoms, such as headaches and vision  changes. °· Diagnosing and treating preeclampsia early is very important. If not treated early, it can cause serious problems for you and your baby. °· Get help right away if you have symptoms that your health care provider told you to watch for. °This information is not intended to replace advice given to you by your health care provider. Make sure you discuss any questions you have with your health care provider. °Document Released: 02/11/2000 Document Revised: 10/16/2017 Document Reviewed: 09/20/2015 °Elsevier Patient Education © 2020 [employer] ° °

## 2018-10-16 NOTE — Progress Notes (Signed)
Acute Office Visit  Subjective:    Patient ID: Natalie Chase, female    DOB: 1980-04-26, 38 y.o.   MRN: [id number]  Chief Complaint  Patient presents with  . Follow-up    BP- changed medication     HPI Patient is in today for blood pressure management last visit we determine she was [redacted] weeks gestational of her pregnancy it should now be approximately 9 weeks. Scheduled OBGYN appointment on October 28, 2018. I will review her Bp on that visit. Patient has sickle cell trait and the father has sickle cell trait explained there is a 1 :4 ratio the baby may be born with sickle cell. Concerned with OBGYN midwife.   Past Medical History:  Diagnosis Date  . Asthma   . Hypertension   . Sickle cell trait ([place])   . Vaginal Pap smear, abnormal     Past Surgical History:  Procedure Laterality Date  . CHOLECYSTECTOMY      Family History  Problem Relation Age of Onset  . Achondroplasia Mother   . Lupus Mother   . Sickle cell anemia Mother   . Hypertension Father   . Stroke Father   . Cancer Other     Social History   Socioeconomic History  . Marital status: Single    Spouse name: Not on file  . Number of children: Not on file  . Years of education: Not on file  . Highest education level: Not on file  Occupational History  . Not on file  Social Needs  . Financial resource strain: Not on file  . Food insecurity    Worry: Not on file    Inability: Not on file  . Transportation needs    Medical: Not on file    Non-medical: Not on file  Tobacco Use  . Smoking status: Former Smoker    Packs/day: 0.00    Types: Cigarettes    Quit date: 01/09/2017    Years since quitting: 1.7  . Smokeless tobacco: Former Network engineer and Sexual Activity  . Alcohol use: Not Currently  . Drug use: Yes    Types: Marijuana  . Sexual activity: Not Currently    Birth control/protection: None  Lifestyle  . Physical activity    Days per week: Not on file    Minutes per session: Not on file   . Stress: Not on file  Relationships  . Social Herbalist on phone: Not on file    Gets together: Not on file    Attends religious service: Not on file    Active member of club or organization: Not on file    Attends meetings of clubs or organizations: Not on file    Relationship status: Not on file  . Intimate partner violence    Fear of current or ex partner: Not on file    Emotionally abused: Not on file    Physically abused: Not on file    Forced sexual activity: Not on file  Other Topics Concern  . Not on file  Social History Narrative  . Not on file    Outpatient Medications Prior to Visit  Medication Sig Dispense Refill  . albuterol (PROVENTIL HFA;VENTOLIN HFA) 108 (90 Base) MCG/ACT inhaler Inhale 1-2 puffs into the lungs every 6 (six) hours as needed for wheezing or shortness of breath. 1 Inhaler 0  . Prenatal Vit-Fe Fumarate-FA (NIVA-PLUS) 27-1 MG TABS Take 1 tablet by mouth daily.    Marland Kitchen NIFEdipine (PROCARDIA  XL) 30 MG 24 hr tablet Take 1 tablet (30 mg total) by mouth daily. 30 tablet 1   No facility-administered medications prior to visit.     No Known Allergies  Review of Systems  Constitutional: Positive for malaise/fatigue.  Gastrointestinal: Positive for nausea.  All other systems reviewed and are negative.      Objective:    Physical Exam  Constitutional: She is oriented to person, place, and time. She appears well-developed and well-nourished.  HENT:  Head: Normocephalic.  Neck: Neck supple.  Cardiovascular: Normal rate and regular rhythm.  Pulmonary/Chest: Effort normal and breath sounds normal.  Abdominal: Soft. Bowel sounds are normal.  Musculoskeletal: Normal range of motion.  Neurological: She is oriented to person, place, and time.  Skin: Skin is warm and dry.  Psychiatric: She has a normal mood and affect.    BP (!) 150/83 (BP Location: Left Arm, Patient Position: Sitting, Cuff Size: Normal)   Pulse 75   Temp 97.8 F (36.6 C)  (Tympanic)   Ht 5\' 5"  (1.651 m)   Wt 196 lb 9.6 oz (89.2 kg)   LMP 08/13/2018 (Approximate)   SpO2 99%   BMI 32.72 kg/m  Wt Readings from Last 3 Encounters:  10/16/18 196 lb 9.6 oz (89.2 kg)  10/02/18 195 lb 12.8 oz (88.8 kg)  09/04/18 194 lb 3.2 oz (88.1 kg)    Health Maintenance Due  Topic Date Due  . INFLUENZA VACCINE  09/28/2018    There are no preventive care reminders to display for this patient.   Lab Results  Component Value Date   TSH 1.700 09/04/2018   Lab Results  Component Value Date   WBC 12.3 (H) 09/04/2018   HGB 12.5 09/04/2018   HCT 39.6 09/04/2018   MCV 79 09/04/2018   PLT 334 09/04/2018   Lab Results  Component Value Date   NA 136 04/26/2018   K 3.6 04/26/2018   CO2 25 04/26/2018   GLUCOSE 86 04/26/2018   BUN 10 04/26/2018   CREATININE 0.75 04/26/2018   BILITOT <0.2 12/24/2017   ALKPHOS 55 12/24/2017   AST 11 12/24/2017   ALT 11 12/24/2017   PROT 6.6 12/24/2017   ALBUMIN 4.2 12/24/2017   CALCIUM 8.7 (L) 04/26/2018   ANIONGAP 4 (L) 04/26/2018   Lab Results  Component Value Date   CHOL 133 07/17/2017   Lab Results  Component Value Date   HDL 39 (L) 07/17/2017   Lab Results  Component Value Date   LDLCALC 74 07/17/2017   Lab Results  Component Value Date   TRIG 101 07/17/2017   Lab Results  Component Value Date   CHOLHDL 3.4 07/17/2017   Lab Results  Component Value Date   HGBA1C 5.5 05/09/2017       Assessment & Plan:   Problem List Items Addressed This Visit    Hypertension - Primary   Relevant Medications   NIFEdipine (ADALAT CC) 60 MG 24 hr tablet    Other Visit Diagnoses    Pregnancy, unspecified gestational age       Sickle cell trait (HCC)        Natalie Chase was seen today for follow-up.  Diagnoses and all orders for this visit:  Essential hypertension Remains elevated increased Procardia 60 mg ER daily, would like to have blood pressure under controlled throughout pregnancy. Return in 2 weeks for Bp check.    Pregnancy, unspecified gestational age 349 weeks referred to GYN appointment is 11/27/2018. Prescribed prenatal vitamins on previous visit  nausea without vomiting.  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 This will be further evaluated during OBGYN visits.  Other orders -     NIFEdipine (ADALAT CC) 60 MG 24 hr tablet; Take 1 tablet (60 mg total) by mouth daily.  Meds ordered this encounter  Medications  . NIFEdipine (ADALAT CC) 60 MG 24 hr tablet    Sig: Take 1 tablet (60 mg total) by mouth daily.    Dispense:  30 tablet    Refill:  1     Grayce SessionsMichelle P , NP

## 2018-10-28 ENCOUNTER — Other Ambulatory Visit: Payer: Self-pay

## 2018-10-28 ENCOUNTER — Emergency Department (HOSPITAL_COMMUNITY)
Admission: EM | Admit: 2018-10-28 | Discharge: 2018-10-28 | Disposition: A | Discharge status: Home / Self Care | Payer: Medicaid Other | Attending: Emergency Medicine | Admitting: Emergency Medicine

## 2018-10-28 ENCOUNTER — Encounter (HOSPITAL_COMMUNITY): Payer: Self-pay

## 2018-10-28 ENCOUNTER — Emergency Department (HOSPITAL_COMMUNITY): Payer: Medicaid Other

## 2018-10-28 DIAGNOSIS — R102 Pelvic and perineal pain: Secondary | ICD-10-CM | POA: Diagnosis not present

## 2018-10-28 DIAGNOSIS — Z87891 Personal history of nicotine dependence: Secondary | ICD-10-CM | POA: Diagnosis not present

## 2018-10-28 DIAGNOSIS — O9989 Other specified diseases and conditions complicating pregnancy, childbirth and the puerperium: Secondary | ICD-10-CM | POA: Insufficient documentation

## 2018-10-28 DIAGNOSIS — Z79899 Other long term (current) drug therapy: Secondary | ICD-10-CM | POA: Insufficient documentation

## 2018-10-28 DIAGNOSIS — Z3A11 11 weeks gestation of pregnancy: Secondary | ICD-10-CM | POA: Diagnosis not present

## 2018-10-28 DIAGNOSIS — O10011 Pre-existing essential hypertension complicating pregnancy, first trimester: Secondary | ICD-10-CM | POA: Diagnosis not present

## 2018-10-28 DIAGNOSIS — O26899 Other specified pregnancy related conditions, unspecified trimester: Secondary | ICD-10-CM

## 2018-10-28 LAB — URINALYSIS, ROUTINE W REFLEX MICROSCOPIC
Bilirubin Urine: NEGATIVE
Glucose, UA: NEGATIVE mg/dL
Hgb urine dipstick: NEGATIVE
Ketones, ur: 20 mg/dL — AB
Leukocytes,Ua: NEGATIVE
Nitrite: NEGATIVE
Protein, ur: NEGATIVE mg/dL
Specific Gravity, Urine: 1.015 (ref 1.005–1.030)
pH: 5 (ref 5.0–8.0)

## 2018-10-28 LAB — BASIC METABOLIC PANEL
Anion gap: 10 (ref 5–15)
BUN: 10 mg/dL (ref 6–20)
CO2: 21 mmol/L — ABNORMAL LOW (ref 22–32)
Calcium: 8.8 mg/dL — ABNORMAL LOW (ref 8.9–10.3)
Chloride: 104 mmol/L (ref 98–111)
Creatinine, Ser: 0.59 mg/dL (ref 0.44–1.00)
GFR calc Af Amer: >60 mL/min (ref >60)
GFR calc non Af Amer: >60 mL/min (ref >60)
Glucose, Bld: 94 mg/dL (ref 70–99)
Potassium: 3.6 mmol/L (ref 3.5–5.1)
Sodium: 135 mmol/L (ref 135–145)

## 2018-10-28 LAB — CBC
HCT: 37 % (ref 36.0–46.0)
Hemoglobin: 12.3 g/dL (ref 12.0–15.0)
MCH: 26.4 pg (ref 26.0–34.0)
MCHC: 33.2 g/dL (ref 30.0–36.0)
MCV: 79.4 fL — ABNORMAL LOW (ref 80.0–100.0)
Platelets: 332 10*3/uL (ref 150–400)
RBC: 4.66 MIL/uL (ref 3.87–5.11)
RDW: 17.1 % — ABNORMAL HIGH (ref 11.5–15.5)
WBC: 13.7 10*3/uL — ABNORMAL HIGH (ref 4.0–10.5)
nRBC: 0 % (ref 0.0–0.2)

## 2018-10-28 LAB — ABO/RH: ABO/RH(D): O POS

## 2018-10-28 LAB — HCG, QUANTITATIVE, PREGNANCY: hCG, Beta Chain, Quant, S: 160963 m[IU]/mL — ABNORMAL HIGH (ref <5)

## 2018-10-28 NOTE — ED Provider Notes (Signed)
[facility]-EMERGENCY DEPT Provider Note   CSN: [id number] Arrival date & time: 10/28/18  0033     History   Chief Complaint Chief Complaint  Patient presents with  . Pelvic Pain    3 months pregnant    HPI Natalie Chase is a 38 y.o. female.     Patient presents to the emergency department for evaluation of pelvic pain.  Pain is diffuse across her lower pelvic region. Patient reports that symptoms have been intermittent for 1 week.  Patient is approximately 3 months pregnant (last menstrual period June 16) but has not had an ultrasound yet.  She has not had any vaginal bleeding, spotting, discharge.  She does report that the pain worsens when she urinates.     Past Medical History:  Diagnosis Date  . Asthma   . Hypertension   . Sickle cell trait (HCC)   . Vaginal Pap smear, abnormal     Patient Active Problem List   Diagnosis Date Noted  . Candida rash of groin 08/08/2018  . Hypertension 06/11/2017  . 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 ASCUS with positive high risk HPV cervical 10/29/2014    Past Surgical History:  Procedure Laterality Date  . CHOLECYSTECTOMY       OB History    Gravida  1   Para  1   Term  1   Preterm      AB      Living  1     SAB      TAB      Ectopic      Multiple      Live Births  1            Home Medications    Prior to Admission medications   Medication Sig Start Date End Date Taking? Authorizing Provider  albuterol (PROVENTIL HFA;VENTOLIN HFA) 108 (90 Base) MCG/ACT inhaler Inhale 1-2 puffs into the lungs every 6 (six) hours as needed for wheezing or shortness of breath. 03/22/15  Yes Barrett, Stevi, PA-C  Iron-Vitamin C (VITRON-C) 65-125 MG TABS Take 1 tablet by mouth daily.   Yes [provider]  NIFEdipine (ADALAT CC) 60 MG 24 hr tablet Take 1 tablet (60 mg total) by mouth daily. 10/16/18  Yes Grayce Sessions, NP  Prenatal Vit-Fe Fumarate-FA (NIVA-PLUS) 27-1  MG TABS Take 1 tablet by mouth daily. 10/12/18  Yes [provider]    Family History Family History  Problem Relation Age of Onset  . Achondroplasia Mother   . Lupus Mother   . Sickle cell anemia Mother   . Hypertension Father   . Stroke Father   . Cancer Other     Social History Social History   Tobacco Use  . Smoking status: Former Smoker    Packs/day: 0.00    Types: Cigarettes    Quit date: 01/09/2017    Years since quitting: 1.8  . Smokeless tobacco: Former Engineer, water Use Topics  . Alcohol use: Not Currently  . Drug use: Yes    Types: Marijuana     Allergies   Patient has no known allergies.   Review of Systems Review of Systems  Genitourinary: Positive for pelvic pain.     Physical Exam Updated Vital Signs BP (!) 144/78   Pulse 73   Temp 98.9 F (37.2 C) (Oral)   Resp 16   SpO2 99%   Physical Exam Vitals signs and nursing note reviewed.  Constitutional:  General: She is not in acute distress.    Appearance: Normal appearance. She is well-developed.  HENT:     Head: Normocephalic and atraumatic.     Right Ear: Hearing normal.     Left Ear: Hearing normal.     Nose: Nose normal.  Eyes:     Conjunctiva/sclera: Conjunctivae normal.     Pupils: Pupils are equal, round, and reactive to light.  Neck:     Musculoskeletal: Normal range of motion and neck supple.  Cardiovascular:     Rate and Rhythm: Regular rhythm.     Heart sounds: S1 normal and S2 normal. No murmur. No friction rub. No gallop.   Pulmonary:     Effort: Pulmonary effort is normal. No respiratory distress.     Breath sounds: Normal breath sounds.  Chest:     Chest wall: No tenderness.  Abdominal:     General: Bowel sounds are normal.     Palpations: Abdomen is soft.     Tenderness: There is abdominal tenderness (Lower abdomen/pelvis). There is no guarding or rebound. Negative signs include Murphy's sign and McBurney's sign.     Hernia: No hernia is present.   Musculoskeletal: Normal range of motion.  Skin:    General: Skin is warm and dry.     Findings: No rash.  Neurological:     Mental Status: She is alert and oriented to person, place, and time.     GCS: GCS eye subscore is 4. GCS verbal subscore is 5. GCS motor subscore is 6.     Cranial Nerves: No cranial nerve deficit.     Sensory: No sensory deficit.     Coordination: Coordination normal.  Psychiatric:        Speech: Speech normal.        Behavior: Behavior normal.        Thought Content: Thought content normal.      ED Treatments / Results  Labs (all labs ordered are listed, but only abnormal results are displayed) Labs Reviewed  URINALYSIS, ROUTINE W REFLEX MICROSCOPIC - Abnormal; Notable for the following components:      Result Value   APPearance HAZY (*)    Ketones, ur 20 (*)    All other components within normal limits  CBC - Abnormal; Notable for the following components:   WBC 13.7 (*)    MCV 79.4 (*)    RDW 17.1 (*)    All other components within normal limits  BASIC METABOLIC PANEL - Abnormal; Notable for the following components:   CO2 21 (*)    Calcium 8.8 (*)    All other components within normal limits  HCG, QUANTITATIVE, PREGNANCY - Abnormal; Notable for the following components:   hCG, Beta Chain, Quant, S 160,963 (*)    All other components within normal limits  WET PREP, GENITAL  ABO/RH  GC/CHLAMYDIA PROBE AMP [facility]    EKG None  Radiology [place] Ob Comp Less 14 Wks  Result Date: 10/28/2018 CLINICAL DATA:  Pelvic pain.  First-trimester pregnancy. EXAM: OBSTETRIC <14 WK ULTRASOUND TECHNIQUE: Transabdominal ultrasound was performed for evaluation of the gestation as well as the maternal uterus and adnexal regions. COMPARISON:  None FINDINGS: Intrauterine gestational sac: Present Yolk sac:  Not visualized Embryo:  Present Cardiac Activity: Present Heart Rate: 164 bpm CRL:   48.1 mm   11 w 4 d                  US EDC:  05/20/2019  Subchorionic hemorrhage:  None visualized. Maternal uterus/adnexae: Corpus luteal cyst is present in the left ovary measuring up to 1.9 cm. No other focal lesions are present in the adnexa. There is no significant free fluid. IMPRESSION: 1. Single intrauterine pregnancy with estimated gestational age of [redacted] weeks and 4 days Electronically Signed   By: San Morelle M.D.   On: 10/28/2018 05:26    Procedures Procedures (including critical care time)  Medications Ordered in ED Medications - No data to display   Initial Impression / Assessment and Plan / ED Course  I have reviewed the triage vital signs and the nursing notes.  Pertinent labs & imaging results that were available during my care of the patient were reviewed by me and considered in my medical decision making (see chart for details).        Patient presents to the emergency department for evaluation of pelvic pain.  Patient reports that she is approximately 3 months pregnant.  She has not had prenatal care yet.  Patient reports that discomfort worsens when she urinates but urinalysis does not suggest infection.  Ultrasound shows single intrauterine pregnancy without any concerning findings.  Patient would like to defer pelvic exam until OB/GYN follow-up as ultrasound is negative.  Final Clinical Impressions(s) / ED Diagnoses   Final diagnoses:  Pelvic pain in pregnancy    ED Discharge Orders    None       Orpah Greek, MD 10/28/18 [phone]

## 2018-10-28 NOTE — ED Notes (Signed)
Pelvic Cart set up and ready at bedside 

## 2018-10-28 NOTE — ED Notes (Signed)
Urine culture sent to the lab. 

## 2018-10-28 NOTE — ED Triage Notes (Signed)
Pt reports pelvic pressure for about a week. She is 3 months pregnant. She also reports some urinary hesitancy and states the pain worsens after urination. Denies N/V/D. Denies bleeding or discharge.

## 2018-10-28 NOTE — Discharge Instructions (Addendum)
Please call your OB/GYN doctor today to schedule follow-up.  If you have any further problems, please [facility].

## 2018-10-29 ENCOUNTER — Other Ambulatory Visit: Payer: Self-pay

## 2018-10-29 ENCOUNTER — Other Ambulatory Visit: Payer: Self-pay | Admitting: Obstetrics and Gynecology

## 2018-10-29 ENCOUNTER — Ambulatory Visit (INDEPENDENT_AMBULATORY_CARE_PROVIDER_SITE_OTHER): Payer: Medicaid Other | Admitting: *Deleted

## 2018-10-29 VITALS — Wt 196.0 lb

## 2018-10-29 DIAGNOSIS — O10919 Unspecified pre-existing hypertension complicating pregnancy, unspecified trimester: Secondary | ICD-10-CM | POA: Insufficient documentation

## 2018-10-29 DIAGNOSIS — O0990 Supervision of high risk pregnancy, unspecified, unspecified trimester: Secondary | ICD-10-CM | POA: Insufficient documentation

## 2018-10-29 DIAGNOSIS — O169 Unspecified maternal hypertension, unspecified trimester: Secondary | ICD-10-CM

## 2018-10-29 MED ORDER — ASPIRIN 81 MG PO CHEW: 81.0000 mg | CHEWABLE_TABLET | Freq: Every day | ORAL | 6 refills | Status: DC

## 2018-10-29 NOTE — Progress Notes (Signed)
Rx sent for bASA for pregnancy with h/o CHTN.  Laury Deep, CNM

## 2018-10-29 NOTE — Progress Notes (Signed)
    Virtual Visit via Telephone Note  I connected with Natalie Chase on 10/29/18 at 10:50 AM EDT by telephone and verified that I am speaking with the correct person using two identifiers.  Location: Patient: Natalie Chase MRN: [id number] Provider: Derl Barrow, RN   I discussed the limitations, risks, security and privacy concerns of performing an evaluation and management service by telephone and the availability of in person appointments. I also discussed with the patient that there may be a patient responsible charge related to this service. The patient expressed understanding and agreed to proceed.   History of Present Illness: PRENATAL INTAKE SUMMARY  Natalie Chase presents today New OB Nurse Interview.  OB History    Gravida  3   Para  1   Term  1   Preterm      AB  1   Living  1     SAB      TAB      Ectopic      Multiple      Live Births  1          I have reviewed the patient's medical, obstetrical, social, and family histories, medications, and available lab results.  SUBJECTIVE She has no unusual complaints.   Observations/Objective: Initial nurse interview for history/labs (New OB). Chronic Hypertension  EDD: 05/15/2019 by U/S 10/28/2018 GA: [redacted]w[redacted]d [id number] FHT: non face to face  GENERAL APPEARANCE: oriented to person, place and time  Assessment and Plan: Normal pregnancy Prenatal care- [place] Lab work and physical to be completed at next visit.  Follow Up Instructions:   I discussed the assessment and treatment plan with the patient. The patient was provided an opportunity to ask questions and all were answered. The patient agreed with the plan and demonstrated an understanding of the instructions.   The patient was advised to call back or seek an in-person evaluation if the symptoms worsen or if the condition fails to improve as anticipated.  I provided 20 minutes of non-face-to-face time during this encounter.   Derl Barrow, RN

## 2018-11-06 ENCOUNTER — Other Ambulatory Visit: Payer: Self-pay

## 2018-11-06 DIAGNOSIS — Z20822 Contact with and (suspected) exposure to covid-19: Secondary | ICD-10-CM

## 2018-11-08 ENCOUNTER — Ambulatory Visit (INDEPENDENT_AMBULATORY_CARE_PROVIDER_SITE_OTHER): Payer: BLUE CROSS/BLUE SHIELD | Admitting: Primary Care

## 2018-11-08 LAB — NOVEL CORONAVIRUS, NAA: SARS-CoV-2, NAA: NOT DETECTED

## 2018-11-11 ENCOUNTER — Telehealth: Payer: Self-pay | Admitting: General Practice

## 2018-11-11 NOTE — Telephone Encounter (Signed)
Left message on VM for patient to give our office a call back in regards to New OB on 11/28/2018 at 8:30am.

## 2018-11-14 ENCOUNTER — Ambulatory Visit (INDEPENDENT_AMBULATORY_CARE_PROVIDER_SITE_OTHER): Payer: Medicaid Other | Admitting: Obstetrics and Gynecology

## 2018-11-14 ENCOUNTER — Encounter: Payer: Self-pay | Admitting: Obstetrics and Gynecology

## 2018-11-14 ENCOUNTER — Other Ambulatory Visit (HOSPITAL_COMMUNITY)
Admission: RE | Admit: 2018-11-14 | Discharge: 2018-11-14 | Disposition: A | Discharge status: Home / Self Care | Payer: Medicaid Other | Source: Ambulatory Visit | Attending: Obstetrics and Gynecology | Admitting: Obstetrics and Gynecology

## 2018-11-14 ENCOUNTER — Encounter: Payer: Self-pay | Admitting: General Practice

## 2018-11-14 ENCOUNTER — Other Ambulatory Visit: Payer: Self-pay

## 2018-11-14 ENCOUNTER — Other Ambulatory Visit: Payer: Self-pay | Admitting: Obstetrics and Gynecology

## 2018-11-14 VITALS — BP 132/69 | HR 112 | Temp 98.7°F | Wt 191.8 lb

## 2018-11-14 DIAGNOSIS — O0991 Supervision of high risk pregnancy, unspecified, first trimester: Secondary | ICD-10-CM

## 2018-11-14 DIAGNOSIS — O99019 Anemia complicating pregnancy, unspecified trimester: Secondary | ICD-10-CM | POA: Insufficient documentation

## 2018-11-14 DIAGNOSIS — O10911 Unspecified pre-existing hypertension complicating pregnancy, first trimester: Secondary | ICD-10-CM

## 2018-11-14 DIAGNOSIS — O10919 Unspecified pre-existing hypertension complicating pregnancy, unspecified trimester: Secondary | ICD-10-CM

## 2018-11-14 DIAGNOSIS — D573 Sickle-cell trait: Secondary | ICD-10-CM | POA: Diagnosis not present

## 2018-11-14 DIAGNOSIS — O0990 Supervision of high risk pregnancy, unspecified, unspecified trimester: Secondary | ICD-10-CM | POA: Diagnosis not present

## 2018-11-14 DIAGNOSIS — O99011 Anemia complicating pregnancy, first trimester: Secondary | ICD-10-CM

## 2018-11-14 DIAGNOSIS — Z3A14 14 weeks gestation of pregnancy: Secondary | ICD-10-CM

## 2018-11-14 LAB — POCT URINALYSIS DIPSTICK OB
Bilirubin, UA: NEGATIVE
Blood, UA: NEGATIVE
Glucose, UA: NEGATIVE
Ketones, UA: NEGATIVE
Leukocytes, UA: NEGATIVE
Nitrite, UA: NEGATIVE
POC,PROTEIN,UA: NEGATIVE
Spec Grav, UA: 1.01 (ref 1.010–1.025)
Urobilinogen, UA: 0.2 E.U./dL
pH, UA: 7 (ref 5.0–8.0)

## 2018-11-14 NOTE — Patient Instructions (Signed)
Genetic Testing During Pregnancy Genetic testing during pregnancy is also called prenatal genetic testing. This type of testing can determine if your baby is at risk of being born with a disorder caused by abnormal genes or chromosomes (genetic disorder). Chromosomes contain genes that control how your baby will develop in your womb. There are many different genetic disorders. Examples of genetic disorders that may be found through genetic testing include Down syndrome and cystic fibrosis. Gene changes (mutations) can be passed down through families. Genetic testing is offered to all women before or during pregnancy. You can choose whether to have genetic testing. Why is genetic testing done? Genetic testing is done during pregnancy to find out whether your child is at risk for a genetic disorder. Having genetic testing allows you to:  Discuss your test results and options with a genetic counselor.  Prepare for a baby that may be born with a genetic disorder. Learning about the disorder ahead of time helps you be better prepared to manage it. Your health care providers can also be prepared in case your baby requires special care before or after birth.  Consider whether you want to continue with the pregnancy. In some cases, genetic testing may be done to learn about the traits a child will inherit. Types of genetic tests There are two basic types of genetic testing. Screening tests indicate whether your developing baby (fetus) is at higher risk for a genetic disorder. Diagnostic tests check actual fetal cells to diagnose a genetic disorder. Screening tests     Screening tests will not harm your baby. They are recommended for all pregnant women. Types of screening tests include:  Carrier screening. This test involves checking genes from both parents by testing their blood or saliva. The test checks to find out if the parents carry a genetic mutation that may be passed to a baby. In most cases,  both parents must carry the mutation for a baby to be at risk.  First trimester screening. This test combines a blood test with sound wave imaging of your baby (fetal ultrasound). This screening test checks for a risk of Down syndrome or other defects caused by having extra chromosomes. It also checks for defects of the heart, abdomen, or skeleton.  Second trimester screening also combines a blood test with a fetal ultrasound exam. It checks for a risk of genetic defects of the face, brain, spine, heart, or limbs.  Combined or sequential screening. This type of testing combines the results of first and second trimester screening. This type of testing may be more accurate than first or second trimester screening alone.  Cell-free DNA testing. This is a blood test that detects cells released by the placenta that get into the mother's blood. It can be used to check for a risk of Down syndrome, other extra chromosome syndromes, and disorders caused by abnormal numbers of sex chromosomes. This test can be done any time after 10 weeks of pregnancy.  Diagnostic tests Diagnostic tests carry slight risks of problems, including bleeding, infection, and loss of the pregnancy. These tests are done only if your baby is at risk for a genetic disorder. You may meet with a genetic counselor to discuss the risks and benefits before having diagnostic tests. Examples of diagnostic tests include:  Chorionic villus sampling (CVS). This involves a procedure to remove and test a sample of cells taken from the placenta. The procedure may be done between 10 and 12 weeks of pregnancy.  Amniocentesis. This involves a  procedure to remove and test a sample of fluid (amniotic fluid) and cells from the sac that surrounds the developing baby. The procedure may be done between 15 and 20 weeks of pregnancy. What do the results mean? For a screening test:  If the results are negative, it often means that your child is not at higher  risk. There is still a slight chance your child could have a genetic disorder.  If the results are positive, it does not mean your child will have a genetic disorder. It may mean that your child has a higher-than-normal risk for a genetic disorder. In that case, you may want to talk with a genetic counselor about whether you should have diagnostic genetic tests. For a diagnostic test:  If the result is negative, it is unlikely that your child will have a genetic disorder.  If the test is positive for a genetic disorder, it is likely that your child will have the disorder. The test may not tell how severe the disorder will be. Talk with your health care provider about your options. Questions to ask your health care provider Before talking to your health care provider about genetic testing, find out if there is a history of genetic disorders in your family. It may also help to know your family's ethnic origins. Then ask your health care provider the following questions:  Is my baby at risk for a genetic disorder?  What are the benefits of having genetic screening?  What tests are best for me and my baby?  What are the risks of each test?  If I get a positive result on a screening test, what is the next step?  Should I meet with a genetic counselor before having a diagnostic test?  Should my partner or other members of my family be tested?  How much do the tests cost? Will my insurance cover the testing? Summary  Genetic testing is done during pregnancy to find out whether your child is at risk for a genetic disorder.  Genetic testing is offered to all women before or during pregnancy. You can choose whether to have genetic testing.  There are two basic types of genetic testing. Screening tests indicate whether your developing baby (fetus) is at higher risk for a genetic disorder. Diagnostic tests check actual fetal cells to diagnose a genetic disorder.  If a diagnostic genetic test is  positive, talk with your health care provider about your options. This information is not intended to replace advice given to you by your health care provider. Make sure you discuss any questions you have with your health care provider. Document Released: 04/30/2017 Document Revised: 06/06/2018 Document Reviewed: 04/30/2017 Elsevier Patient Education  2020 [employer] Hypertension During Pregnancy High blood pressure (hypertension) is when the force of blood pumping through the arteries is too strong. Arteries are blood vessels that carry blood from the heart throughout the body. Hypertension during pregnancy can be mild or severe. Severe hypertension during pregnancy (preeclampsia) is a medical emergency that requires prompt evaluation and treatment. Different types of hypertension can happen during pregnancy. These include:  Chronic hypertension. This happens when you had high blood pressure before you became pregnant, and it continues during the pregnancy. Hypertension that develops before you are [redacted] weeks pregnant and continues during the pregnancy is also called chronic hypertension. If you have chronic hypertension, it will not go away after you have your baby. You will need follow-up visits with your health care provider after you have your  baby. Your doctor may want you to keep taking medicine for your blood pressure.  Gestational hypertension. This is hypertension that develops after the 20th week of pregnancy. Gestational hypertension usually goes away after you have your baby, but your health care provider will need to monitor your blood pressure to make sure that it is getting better.  Preeclampsia. This is severe hypertension during pregnancy. This can cause serious complications for you and your baby and can also cause complications for you after the delivery of your baby.  Postpartum preeclampsia. You may develop severe hypertension after giving birth. This usually occurs within 48  hours after childbirth but may occur up to 6 weeks after giving birth. This is rare. How does this affect me? Women who have hypertension during pregnancy have a greater chance of developing hypertension later in life or during future pregnancies. In some cases, hypertension during pregnancy can cause serious complications, such as:  Stroke.  Heart attack.  Injury to other organs, such as kidneys, lungs, or liver.  Preeclampsia.  Convulsions or seizures.  Placental abruption. How does this affect my baby? Hypertension during pregnancy can affect your baby. Your baby may:  Be born early (prematurely).  Not weigh as much as he or she should at birth (low birth weight).  Not tolerate labor well, leading to an unplanned cesarean delivery. What are the risks? There are certain factors that make it more likely for you to develop hypertension during pregnancy. These include:  Having hypertension during a previous pregnancy.  Being overweight.  Being age 44 or older.  Being pregnant for the first time.  Being pregnant with more than one baby.  Becoming pregnant using fertilization methods, such as IVF (in vitro fertilization).  Having other medical problems, such as diabetes, kidney disease, or lupus.  Having a family history of hypertension. What can I do to lower my risk? The exact cause of hypertension during pregnancy is not known. You may be able to lower your risk by:  Maintaining a healthy weight.  Eating a healthy and balanced diet.  Following your health care provider's instructions about treating any long-term conditions that you had before becoming pregnant. It is very important to keep all of your prenatal care appointments. Your health care provider will check your blood pressure and make sure that your pregnancy is progressing as expected. If a problem is found, early treatment can prevent complications. How is this treated? Treatment for hypertension during  pregnancy varies depending on the type of hypertension you have and how serious it is.  If you were taking medicine for high blood pressure before you became pregnant, talk with your health care provider. You may need to change medicine during pregnancy because some medicines, like ACE inhibitors, may not be considered safe for your baby.  If you have gestational hypertension, your health care provider may order medicine to treat this during pregnancy.  If you are at risk for preeclampsia, your health care provider may recommend that you take a low-dose aspirin during your pregnancy.  If you have severe hypertension, you may need to be hospitalized so you and your baby can be monitored closely. You may also need to be given medicine to lower your blood pressure. This medicine may be given by mouth or through an IV.  In some cases, if your condition gets worse, you may need to deliver your baby early. Follow these instructions at home: Eating and drinking   Drink enough fluid to keep your urine pale  yellow.  Avoid caffeine. Lifestyle  Do not use any products that contain nicotine or tobacco, such as cigarettes, e-cigarettes, and chewing tobacco. If you need help quitting, ask your health care provider.  Do not use alcohol or drugs.  Avoid stress as much as possible.  Rest and get plenty of sleep.  Regular exercise can help to reduce your blood pressure. Ask your health care provider what kinds of exercise are best for you. General instructions  Take over-the-counter and prescription medicines only as told by your health care provider.  Keep all prenatal and follow-up visits as told by your health care provider. This is important. Contact a health care provider if:  You have symptoms that your health care provider told you may require more treatment or monitoring, such as: ? Headaches. ? Nausea or vomiting. ? Abdominal pain. ? Dizziness. ? Light-headedness. Get help right away  if:  You have: ? Severe abdominal pain that does not get better with treatment. ? A severe headache that does not get better. ? Vomiting that does not get better. ? Sudden, rapid weight gain. ? Sudden swelling in your hands, ankles, or face. ? Vaginal bleeding. ? Blood in your urine. ? Blurred or double vision. ? Shortness of breath or chest pain. ? Weakness on one side of your body. ? Difficulty speaking.  Your baby is not moving as much as usual. Summary  High blood pressure (hypertension) is when the force of blood pumping through the arteries is too strong.  Hypertension during pregnancy can cause problems for you and your baby.  Treatment for hypertension during pregnancy varies depending on the type of hypertension you have and how serious it is.  Keep all prenatal and follow-up visits as told by your health care provider. This is important. This information is not intended to replace advice given to you by your health care provider. Make sure you discuss any questions you have with your health care provider. Document Released: 11/01/2010 Document Revised: 06/06/2018 Document Reviewed: 03/12/2018 Elsevier Patient Education  2020 [employer] Second Trimester of Pregnancy  The second trimester is from week 14 through week 27 (month 4 through 6). This is often the time in pregnancy that you feel your best. Often times, morning sickness has lessened or quit. You may have more energy, and you may get hungry more often. Your unborn baby is growing rapidly. At the end of the sixth month, he or she is about 9 inches long and weighs about 1 pounds. You will likely feel the baby move between 18 and 20 weeks of pregnancy. Follow these instructions at home: Medicines  Take over-the-counter and prescription medicines only as told by your doctor. Some medicines are safe and some medicines are not safe during pregnancy.  Take a prenatal vitamin that contains at least 600 micrograms  (mcg) of folic acid.  If you have trouble pooping (constipation), take medicine that will make your stool soft (stool softener) if your doctor approves. Eating and drinking   Eat regular, healthy meals.  Avoid raw meat and uncooked cheese.  If you get low calcium from the food you eat, talk to your doctor about taking a daily calcium supplement.  Avoid foods that are high in fat and sugars, such as fried and sweet foods.  If you feel sick to your stomach (nauseous) or throw up (vomit): ? Eat 4 or 5 small meals a day instead of 3 large meals. ? Try eating a few soda crackers. ? Drink liquids  between meals instead of during meals.  To prevent constipation: ? Eat foods that are high in fiber, like fresh fruits and vegetables, whole grains, and beans. ? Drink enough fluids to keep your pee (urine) clear or pale yellow. Activity  Exercise only as told by your doctor. Stop exercising if you start to have cramps.  Do not exercise if it is too hot, too humid, or if you are in a place of great height (high altitude).  Avoid heavy lifting.  Wear low-heeled shoes. Sit and stand up straight.  You can continue to have sex unless your doctor tells you not to. Relieving pain and discomfort  Wear a good support bra if your breasts are tender.  Take warm water baths (sitz baths) to soothe pain or discomfort caused by hemorrhoids. Use hemorrhoid cream if your doctor approves.  Rest with your legs raised if you have leg cramps or low back pain.  If you develop puffy, bulging veins (varicose veins) in your legs: ? Wear support hose or compression stockings as told by your doctor. ? Raise (elevate) your feet for 15 minutes, 3-4 times a day. ? Limit salt in your food. Prenatal care  Write down your questions. Take them to your prenatal visits.  Keep all your prenatal visits as told by your doctor. This is important. Safety  Wear your seat belt when driving.  Make a list of emergency  phone numbers, including numbers for family, friends, the hospital, and police and fire departments. General instructions  Ask your doctor about the right foods to eat or for help finding a counselor, if you need these services.  Ask your doctor about local prenatal classes. Begin classes before month 6 of your pregnancy.  Do not use hot tubs, steam rooms, or saunas.  Do not douche or use tampons or scented sanitary pads.  Do not cross your legs for long periods of time.  Visit your dentist if you have not done so. Use a soft toothbrush to brush your teeth. Floss gently.  Avoid all smoking, herbs, and alcohol. Avoid drugs that are not approved by your doctor.  Do not use any products that contain nicotine or tobacco, such as cigarettes and e-cigarettes. If you need help quitting, ask your doctor.  Avoid cat litter boxes and soil used by cats. These carry germs that can cause birth defects in the baby and can cause a loss of your baby (miscarriage) or stillbirth. Contact a doctor if:  You have mild cramps or pressure in your lower belly.  You have pain when you pee (urinate).  You have bad smelling fluid coming from your vagina.  You continue to feel sick to your stomach (nauseous), throw up (vomit), or have watery poop (diarrhea).  You have a nagging pain in your belly area.  You feel dizzy. Get help right away if:  You have a fever.  You are leaking fluid from your vagina.  You have spotting or bleeding from your vagina.  You have severe belly cramping or pain.  You lose or gain weight rapidly.  You have trouble catching your breath and have chest pain.  You notice sudden or extreme puffiness (swelling) of your face, hands, ankles, feet, or legs.  You have not felt the baby move in over an hour.  You have severe headaches that do not go away when you take medicine.  You have trouble seeing. Summary  The second trimester is from week 14 through week 27 (months  4  through 6). This is often the time in pregnancy that you feel your best.  To take care of yourself and your unborn baby, you will need to eat healthy meals, take medicines only if your doctor tells you to do so, and do activities that are safe for you and your baby.  Call your doctor if you get sick or if you notice anything unusual about your pregnancy. Also, call your doctor if you need help with the right food to eat, or if you want to know what activities are safe for you. This information is not intended to replace advice given to you by your health care provider. Make sure you discuss any questions you have with your health care provider. Document Released: 05/10/2009 Document Revised: 06/07/2018 Document Reviewed: 03/21/2016 Elsevier Patient Education  2020 [employer].

## 2018-11-14 NOTE — Progress Notes (Signed)
Subjective:    Natalie Chase is being seen today for her first obstetrical visit.  This is not a planned pregnancy, but desired pregnancy. She is at 89w0dgestation. Her obstetrical history is significant for advanced maternal age, obesity and chronic hypertension. She is taking Procardia 60 mg qd ([facility] Relationship with FOB (Darrell): significant other, not living together. Darrell present for visit via [place] He reports his mother told him he has SCT. He consents to being tested d/t no documentation of SCT from his mother. Patient does intend to breast feed. Pregnancy history fully reviewed.  *Normal pap results from 05/2017.  Patient reports no complaints.  Review of Systems:   Review of Systems  Constitutional: Negative.   HENT: Negative.   Eyes: Negative.   Respiratory: Negative.   Cardiovascular: Negative.   Gastrointestinal: Negative.  Negative for nausea ("just cleared up about 1 week ago").  Endocrine: Negative.   Genitourinary: Negative.   Musculoskeletal: Negative.   Skin: Negative.   Allergic/Immunologic: Negative.   Neurological: Negative.   Hematological: Negative.   Psychiatric/Behavioral: Negative.     Objective:     BP (!) 146/79   Pulse (!) 114   Temp 98.7 F (37.1 C)   Wt 191 lb 12.8 oz (87 kg)   LMP 08/13/2018 (Approximate)   BMI 31.92 kg/m  Physical Exam  Nursing note and vitals reviewed. Constitutional: She is oriented to person, place, and time. She appears well-developed and well-nourished.  HENT:  Head: Normocephalic and atraumatic.  Right Ear: External ear normal.  Left Ear: External ear normal.  Nose: Nose normal.  Mouth/Throat: Oropharynx is clear and moist.  Eyes: Pupils are equal, round, and reactive to light. Conjunctivae and EOM are normal.  Neck: Normal range of motion. Neck supple.  Cardiovascular: Normal rate, regular rhythm, normal heart sounds and intact distal pulses.   Respiratory: Effort normal and breath sounds normal.  GI: Soft. Bowel sounds are normal.  Genitourinary:    Vulva normal.     Vaginal discharge present.     Genitourinary Comments: Uterus: non-tender, S=D, SE: cervix is smooth, pink, no lesions, small amt of thick, white vaginal d/c -- WP, GC/CT done, closed/long/firm, no CMT or friability, mild LT adnexal tenderness    Musculoskeletal: Normal range of motion.  Neurological: She is alert and oriented to person, place, and time. She has normal reflexes.  Skin: Skin is warm and dry.  Psychiatric: She has a normal mood and affect. Her behavior is normal. Judgment and thought content normal.    Maternal Exam:  Abdomen: Patient reports no abdominal tenderness. Fundal height is 13.    Introitus: Normal vulva. Vagina is positive for vaginal discharge.  Ferning test: not done.  Nitrazine test: not done. Amniotic fluid character: not assessed.  Pelvis: adequate for delivery.   Cervix: Cervix evaluated by sterile speculum exam and digital exam.     Fetal Exam Fetal Monitor Review: Mode: hand-held doppler probe.   Baseline rate: 160.         Assessment:    Pregnancy: G3P1011 Patient Active Problem List   Diagnosis Date Noted  . Supervision of high risk pregnancy, antepartum 10/29/2018  . Chronic hypertension affecting pregnancy 10/29/2018  . Candida rash of groin 08/08/2018  . Hypertension 06/11/2017  . 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 ASCUS with positive high risk HPV cervical 10/29/2014       Plan:   - Welcomed to practice and introduced self  to patient in addition to introduction of other midwives at this practice that she will be seeing - Educated and discussed COVID19 precautions  - Routine prenatal care   Initial and baseline PEC labs drawn. Panorama & Horizon labs drawn. Horizon kit given for FOB. Prenatal vitamins. Problem list reviewed and updated. AFP3 discussed: requested. Role of  ultrasound in pregnancy discussed; fetal survey: ordered. Amniocentesis discussed: not indicated. The nature of [facility] with multiple MDs and other Advanced Practice Providers was explained to patient; also emphasized that residents, students are part of our team.  Discussed optimized OB schedule and video visits. Advised can have an in-office visit whenever she feels she needs to be seen.  Advised to call during normal business hours and there is an after-hours nurse line available. BP cuff Rx faxed today. Explained to patient that BP will be mailed to her house. Plan repeat pap in 05/2020. Follow up in 6 weeks via My Chart. 50% of 40 min visit spent on counseling and coordination of care.      , MSN, CNM 11/14/2018   

## 2018-11-15 LAB — COMPREHENSIVE METABOLIC PANEL
ALT: 14 IU/L (ref 0–32)
AST: 14 IU/L (ref 0–40)
Albumin/Globulin Ratio: 1.4 (ref 1.2–2.2)
Albumin: 4.1 g/dL (ref 3.8–4.8)
Alkaline Phosphatase: 62 IU/L (ref 39–117)
BUN/Creatinine Ratio: 7 — ABNORMAL LOW (ref 9–23)
BUN: 5 mg/dL — ABNORMAL LOW (ref 6–20)
Bilirubin Total: <0.2 mg/dL (ref 0.0–1.2)
CO2: 19 mmol/L — ABNORMAL LOW (ref 20–29)
Calcium: 9.5 mg/dL (ref 8.7–10.2)
Chloride: 103 mmol/L (ref 96–106)
Creatinine, Ser: 0.72 mg/dL (ref 0.57–1.00)
GFR calc Af Amer: 125 mL/min/{1.73_m2} (ref >59)
GFR calc non Af Amer: 108 mL/min/{1.73_m2} (ref >59)
Globulin, Total: 2.9 g/dL (ref 1.5–4.5)
Glucose: 108 mg/dL — ABNORMAL HIGH (ref 65–99)
Potassium: 4.1 mmol/L (ref 3.5–5.2)
Sodium: 135 mmol/L (ref 134–144)
Total Protein: 7 g/dL (ref 6.0–8.5)

## 2018-11-15 LAB — OBSTETRIC PANEL, INCLUDING HIV
Antibody Screen: NEGATIVE
Basophils Absolute: 0.1 10*3/uL (ref 0.0–0.2)
Basos: 1 %
EOS (ABSOLUTE): 0.1 10*3/uL (ref 0.0–0.4)
Eos: 1 %
HIV Screen 4th Generation wRfx: NONREACTIVE
Hematocrit: 38.9 % (ref 34.0–46.6)
Hemoglobin: 12.5 g/dL (ref 11.1–15.9)
Hepatitis B Surface Ag: NEGATIVE
Immature Grans (Abs): 0 10*3/uL (ref 0.0–0.1)
Immature Granulocytes: 0 %
Lymphocytes Absolute: 1.9 10*3/uL (ref 0.7–3.1)
Lymphs: 13 %
MCH: 26.5 pg — ABNORMAL LOW (ref 26.6–33.0)
MCHC: 32.1 g/dL (ref 31.5–35.7)
MCV: 83 fL (ref 79–97)
Monocytes Absolute: 0.9 10*3/uL (ref 0.1–0.9)
Monocytes: 6 %
Neutrophils Absolute: 12 10*3/uL — ABNORMAL HIGH (ref 1.4–7.0)
Neutrophils: 79 %
Platelets: 399 10*3/uL (ref 150–450)
RBC: 4.71 x10E6/uL (ref 3.77–5.28)
RDW: 17.5 % — ABNORMAL HIGH (ref 11.7–15.4)
RPR Ser Ql: NONREACTIVE
Rh Factor: POSITIVE
Rubella Antibodies, IGG: 11.9 index (ref >0.99)
WBC: 15.1 10*3/uL — ABNORMAL HIGH (ref 3.4–10.8)

## 2018-11-15 LAB — PROTEIN / CREATININE RATIO, URINE
Creatinine, Urine: 17.4 mg/dL
Protein, Ur: <4 mg/dL

## 2018-11-15 LAB — HEMOGLOBIN A1C
Est. average glucose Bld gHb Est-mCnc: 105 mg/dL
Hgb A1c MFr Bld: 5.3 % (ref 4.8–5.6)

## 2018-11-17 LAB — CULTURE, OB URINE

## 2018-11-17 LAB — URINE CULTURE, OB REFLEX

## 2018-11-18 LAB — CERVICOVAGINAL ANCILLARY ONLY
Bacterial Vaginitis (gardnerella): NEGATIVE
Candida Glabrata: NEGATIVE
Candida Vaginitis: NEGATIVE
Molecular Disclaimer: NEGATIVE
Molecular Disclaimer: NEGATIVE
Molecular Disclaimer: NEGATIVE
Molecular Disclaimer: NORMAL
Trichomonas: NEGATIVE

## 2018-11-19 ENCOUNTER — Encounter: Payer: Self-pay | Admitting: General Practice

## 2018-11-19 LAB — CERVICOVAGINAL ANCILLARY ONLY
Chlamydia: NEGATIVE
Neisseria Gonorrhea: NEGATIVE

## 2018-11-25 ENCOUNTER — Encounter: Payer: Self-pay | Admitting: General Practice

## 2018-11-26 ENCOUNTER — Encounter: Payer: Self-pay | Admitting: General Practice

## 2018-11-27 ENCOUNTER — Encounter: Payer: Self-pay | Admitting: General Practice

## 2018-11-28 ENCOUNTER — Encounter: Payer: Medicaid Other | Admitting: Obstetrics and Gynecology

## 2018-12-06 ENCOUNTER — Other Ambulatory Visit (INDEPENDENT_AMBULATORY_CARE_PROVIDER_SITE_OTHER): Payer: Self-pay | Admitting: Primary Care

## 2018-12-06 NOTE — Telephone Encounter (Signed)
Sent to patients OBGYN. Nat Christen, CMA

## 2018-12-12 ENCOUNTER — Other Ambulatory Visit: Payer: Self-pay | Admitting: Obstetrics and Gynecology

## 2018-12-12 DIAGNOSIS — M549 Dorsalgia, unspecified: Secondary | ICD-10-CM

## 2018-12-12 DIAGNOSIS — R109 Unspecified abdominal pain: Secondary | ICD-10-CM

## 2018-12-12 DIAGNOSIS — O99891 Other specified diseases and conditions complicating pregnancy: Secondary | ICD-10-CM

## 2018-12-12 MED ORDER — CEFADROXIL 500 MG PO CAPS: 500.0000 mg | ORAL_CAPSULE | Freq: Two times a day (BID) | ORAL | 0 refills | Status: AC

## 2018-12-12 NOTE — Progress Notes (Signed)
Rx sent for patient's complaint of RT flank/back pain. UCx was (+) for >100K lactobacillus.  Laury Deep, CNM

## 2018-12-16 ENCOUNTER — Encounter (HOSPITAL_COMMUNITY): Payer: Self-pay

## 2018-12-16 ENCOUNTER — Other Ambulatory Visit: Payer: Self-pay

## 2018-12-16 ENCOUNTER — Ambulatory Visit (HOSPITAL_COMMUNITY): Payer: Medicaid Other | Admitting: *Deleted

## 2018-12-16 ENCOUNTER — Other Ambulatory Visit (HOSPITAL_COMMUNITY): Payer: Self-pay | Admitting: *Deleted

## 2018-12-16 ENCOUNTER — Ambulatory Visit (HOSPITAL_COMMUNITY)
Admission: RE | Admit: 2018-12-16 | Discharge: 2018-12-16 | Disposition: A | Discharge status: Home / Self Care | Payer: Medicaid Other | Source: Ambulatory Visit | Attending: Obstetrics and Gynecology | Admitting: Obstetrics and Gynecology

## 2018-12-16 DIAGNOSIS — O10919 Unspecified pre-existing hypertension complicating pregnancy, unspecified trimester: Secondary | ICD-10-CM

## 2018-12-16 DIAGNOSIS — O99019 Anemia complicating pregnancy, unspecified trimester: Secondary | ICD-10-CM | POA: Insufficient documentation

## 2018-12-16 DIAGNOSIS — O10012 Pre-existing essential hypertension complicating pregnancy, second trimester: Secondary | ICD-10-CM

## 2018-12-16 DIAGNOSIS — O09522 Supervision of elderly multigravida, second trimester: Secondary | ICD-10-CM

## 2018-12-16 DIAGNOSIS — O0990 Supervision of high risk pregnancy, unspecified, unspecified trimester: Secondary | ICD-10-CM

## 2018-12-16 DIAGNOSIS — O26892 Other specified pregnancy related conditions, second trimester: Secondary | ICD-10-CM

## 2018-12-16 DIAGNOSIS — O09529 Supervision of elderly multigravida, unspecified trimester: Secondary | ICD-10-CM

## 2018-12-16 DIAGNOSIS — D573 Sickle-cell trait: Secondary | ICD-10-CM | POA: Diagnosis present

## 2018-12-16 DIAGNOSIS — O99212 Obesity complicating pregnancy, second trimester: Secondary | ICD-10-CM

## 2018-12-16 DIAGNOSIS — M329 Systemic lupus erythematosus, unspecified: Secondary | ICD-10-CM

## 2018-12-16 DIAGNOSIS — J45909 Unspecified asthma, uncomplicated: Secondary | ICD-10-CM

## 2018-12-16 DIAGNOSIS — O99891 Other specified diseases and conditions complicating pregnancy: Secondary | ICD-10-CM

## 2018-12-16 DIAGNOSIS — Z3A18 18 weeks gestation of pregnancy: Secondary | ICD-10-CM

## 2018-12-26 ENCOUNTER — Telehealth (INDEPENDENT_AMBULATORY_CARE_PROVIDER_SITE_OTHER): Payer: Medicaid Other | Admitting: Obstetrics and Gynecology

## 2018-12-26 ENCOUNTER — Other Ambulatory Visit: Payer: Self-pay

## 2018-12-26 ENCOUNTER — Encounter: Payer: Self-pay | Admitting: Obstetrics and Gynecology

## 2018-12-26 ENCOUNTER — Encounter: Payer: Self-pay | Admitting: General Practice

## 2018-12-26 DIAGNOSIS — O99891 Other specified diseases and conditions complicating pregnancy: Secondary | ICD-10-CM

## 2018-12-26 DIAGNOSIS — O10912 Unspecified pre-existing hypertension complicating pregnancy, second trimester: Secondary | ICD-10-CM

## 2018-12-26 DIAGNOSIS — O0990 Supervision of high risk pregnancy, unspecified, unspecified trimester: Secondary | ICD-10-CM

## 2018-12-26 DIAGNOSIS — Z3A20 20 weeks gestation of pregnancy: Secondary | ICD-10-CM

## 2018-12-26 DIAGNOSIS — D573 Sickle-cell trait: Secondary | ICD-10-CM

## 2018-12-26 DIAGNOSIS — O26892 Other specified pregnancy related conditions, second trimester: Secondary | ICD-10-CM

## 2018-12-26 DIAGNOSIS — O0992 Supervision of high risk pregnancy, unspecified, second trimester: Secondary | ICD-10-CM

## 2018-12-26 DIAGNOSIS — O10919 Unspecified pre-existing hypertension complicating pregnancy, unspecified trimester: Secondary | ICD-10-CM

## 2018-12-26 DIAGNOSIS — M549 Dorsalgia, unspecified: Secondary | ICD-10-CM

## 2018-12-26 DIAGNOSIS — R102 Pelvic and perineal pain: Secondary | ICD-10-CM

## 2018-12-26 DIAGNOSIS — O99012 Anemia complicating pregnancy, second trimester: Secondary | ICD-10-CM

## 2018-12-26 MED ORDER — COMFORT FIT MATERNITY SUPP LG MISC: 1.0000 [IU] | Freq: Every day | 0 refills | Status: DC

## 2018-12-26 MED ORDER — CYCLOBENZAPRINE HCL 10 MG PO TABS: 10.0000 mg | ORAL_TABLET | Freq: Three times a day (TID) | ORAL | 1 refills | Status: DC | PRN

## 2018-12-26 NOTE — Progress Notes (Signed)
MY CHART VIDEO VIRTUAL OBSTETRICS VISIT ENCOUNTER NOTE  I connected with Natalie Chase on 12/26/18 at  9:10 AM EDT by My Chart video at home and verified that I am speaking with the correct person using two identifiers.   I discussed the limitations, risks, security and privacy concerns of performing an evaluation and management service by My Chart video and the availability of in person appointments. I also discussed with the patient that there may be a patient responsible charge related to this service. The patient expressed understanding and agreed to proceed.  Subjective:  Natalie Chase is a 38 y.o. G3P1011 at [redacted]w[redacted]d being followed for ongoing prenatal care.  She is currently monitored for the following issues for this high-risk pregnancy and has Hypertension; Candida rash of groin; Supervision of high risk pregnancy, antepartum; Chronic hypertension affecting pregnancy; and Sickle cell trait in mother affecting pregnancy (HCC) on their problem list.  Patient reports backache and pelvic and hip pain from walking too much. 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 She states that she has to "run around chasing him and he kicks violently." She is afraid the way in which she has to care for him may cause her to get kicked in the abdomen or knocked down. She is requesting to be given a letter for a leave of absence from work.. Reports fetal movement. Denies any contractions, bleeding or leaking of fluid.   The following portions of the patient's history were reviewed and updated as appropriate: allergies, current medications, past family history, past medical history, past social history, past surgical history and problem list.   Objective:   General:  Alert, oriented and cooperative.   Mental Status: Normal mood and affect perceived. Normal judgment and thought content.  Rest of physical exam deferred due to type of encounter  LMP  08/13/2018 (Approximate)  **Has not taken BP this morning -- will send BP by My Chart message   Assessment and Plan:  Pregnancy: G3P1011 at [redacted]w[redacted]d  1. Sickle cell trait in mother affecting pregnancy (HCC)  2. Supervision of high risk pregnancy, antepartum - Discussed the [facility] high maternity transfer guidelines that require our office to transfer her care to one of our sister offices that care for HROB patients - [facility] will be where she is transferred after today's visit - Patient is scheduled to have a F/U U/S on 01/13/19 - Letter to job requesting reassigning patient load  3. Chronic hypertension affecting pregnancy - Continue taking Procardia 60 mg qd - Continue taking bASA daily  4. Pelvic pain affecting pregnancy in second trimester, antepartum - Rx for cyclobenzaprine (FLEXERIL) 10 MG tablet; Take 1 tablet (10 mg total) by mouth every 8 (eight) hours as needed for muscle spasms.  Dispense: 30 tablet; Refill: 1 - Elastic Bandages & Supports (COMFORT FIT MATERNITY SUPP LG) MISC; 1 Units by Does not apply route daily.  Dispense: 1 each; Refill: 0  5. Back pain affecting pregnancy in second trimester - Rx for cyclobenzaprine (FLEXERIL) 10 MG tablet; Take 1 tablet (10 mg total) by mouth every 8 (eight) hours as needed for muscle spasms.  Dispense: 30 tablet; Refill: 1  Preterm labor symptoms and general obstetric precautions including but not limited to vaginal bleeding, contractions, leaking of fluid and fetal movement were reviewed in detail with the patient.  I discussed the assessment and treatment plan with the patient. The patient was provided an opportunity to ask questions and  all were answered. The patient agreed with the plan and demonstrated an understanding of the instructions. The patient was advised to call back or seek an in-person office evaluation/go to [facility]U at [facility] for any urgent or concerning symptoms. Please refer to After Visit Summary for other  counseling recommendations.   I provided 10 minutes of non-face-to-face time during this encounter. There was 5 minutes of chart review time spent prior to this encounter. Total time spent = 15 minutes.  Return in about 4 weeks (around 01/23/2019) for Return OB - My Chart video at [facility].  Future Appointments  Date Time Provider [place]  01/13/2019  9:45 AM WH-MFC [place] 2 WH-MFCUS MFC-US  01/13/2019  9:50 AM WH-MFC NURSE WH-MFC MFC-US  01/27/2019  9:15 AM Sloan Leiter, MD [place] None    Laury Deep, [place] for [employer], [employer]

## 2019-01-13 ENCOUNTER — Ambulatory Visit (HOSPITAL_COMMUNITY): Payer: Medicaid Other | Admitting: *Deleted

## 2019-01-13 ENCOUNTER — Other Ambulatory Visit (HOSPITAL_COMMUNITY): Payer: Self-pay | Admitting: *Deleted

## 2019-01-13 ENCOUNTER — Other Ambulatory Visit: Payer: Self-pay

## 2019-01-13 ENCOUNTER — Ambulatory Visit (HOSPITAL_COMMUNITY)
Admission: RE | Admit: 2019-01-13 | Discharge: 2019-01-13 | Disposition: A | Discharge status: Home / Self Care | Payer: Medicaid Other | Source: Ambulatory Visit | Attending: Obstetrics | Admitting: Obstetrics

## 2019-01-13 ENCOUNTER — Encounter (HOSPITAL_COMMUNITY): Payer: Self-pay

## 2019-01-13 DIAGNOSIS — O10919 Unspecified pre-existing hypertension complicating pregnancy, unspecified trimester: Secondary | ICD-10-CM | POA: Diagnosis present

## 2019-01-13 DIAGNOSIS — Z362 Encounter for other antenatal screening follow-up: Secondary | ICD-10-CM

## 2019-01-13 DIAGNOSIS — O0990 Supervision of high risk pregnancy, unspecified, unspecified trimester: Secondary | ICD-10-CM | POA: Insufficient documentation

## 2019-01-13 DIAGNOSIS — J45909 Unspecified asthma, uncomplicated: Secondary | ICD-10-CM

## 2019-01-13 DIAGNOSIS — O99212 Obesity complicating pregnancy, second trimester: Secondary | ICD-10-CM

## 2019-01-13 DIAGNOSIS — O10012 Pre-existing essential hypertension complicating pregnancy, second trimester: Secondary | ICD-10-CM

## 2019-01-13 DIAGNOSIS — M329 Systemic lupus erythematosus, unspecified: Secondary | ICD-10-CM

## 2019-01-13 DIAGNOSIS — O09522 Supervision of elderly multigravida, second trimester: Secondary | ICD-10-CM | POA: Diagnosis not present

## 2019-01-13 DIAGNOSIS — O99891 Other specified diseases and conditions complicating pregnancy: Secondary | ICD-10-CM

## 2019-01-13 DIAGNOSIS — D573 Sickle-cell trait: Secondary | ICD-10-CM

## 2019-01-13 DIAGNOSIS — Z3A22 22 weeks gestation of pregnancy: Secondary | ICD-10-CM

## 2019-01-13 DIAGNOSIS — O26892 Other specified pregnancy related conditions, second trimester: Secondary | ICD-10-CM

## 2019-01-13 DIAGNOSIS — O99019 Anemia complicating pregnancy, unspecified trimester: Secondary | ICD-10-CM | POA: Insufficient documentation

## 2019-01-13 DIAGNOSIS — O09529 Supervision of elderly multigravida, unspecified trimester: Secondary | ICD-10-CM | POA: Diagnosis not present

## 2019-01-14 ENCOUNTER — Other Ambulatory Visit (INDEPENDENT_AMBULATORY_CARE_PROVIDER_SITE_OTHER): Payer: Self-pay | Admitting: Obstetrics and Gynecology

## 2019-01-14 ENCOUNTER — Other Ambulatory Visit: Payer: Self-pay

## 2019-01-14 DIAGNOSIS — Z20822 Contact with and (suspected) exposure to covid-19: Secondary | ICD-10-CM

## 2019-01-16 LAB — NOVEL CORONAVIRUS, NAA: SARS-CoV-2, NAA: NOT DETECTED

## 2019-01-20 ENCOUNTER — Inpatient Hospital Stay (HOSPITAL_COMMUNITY)
Admission: AD | Admit: 2019-01-20 | Discharge: 2019-01-20 | Disposition: A | Discharge status: Home / Self Care | Payer: Medicaid Other | Attending: Obstetrics and Gynecology | Admitting: Obstetrics and Gynecology

## 2019-01-20 ENCOUNTER — Encounter (HOSPITAL_COMMUNITY): Payer: Self-pay

## 2019-01-20 ENCOUNTER — Other Ambulatory Visit: Payer: Self-pay | Admitting: Certified Nurse Midwife

## 2019-01-20 ENCOUNTER — Other Ambulatory Visit: Payer: Self-pay

## 2019-01-20 DIAGNOSIS — D6862 Lupus anticoagulant syndrome: Secondary | ICD-10-CM | POA: Diagnosis not present

## 2019-01-20 DIAGNOSIS — O162 Unspecified maternal hypertension, second trimester: Secondary | ICD-10-CM | POA: Insufficient documentation

## 2019-01-20 DIAGNOSIS — O99112 Other diseases of the blood and blood-forming organs and certain disorders involving the immune mechanism complicating pregnancy, second trimester: Secondary | ICD-10-CM | POA: Insufficient documentation

## 2019-01-20 DIAGNOSIS — O09522 Supervision of elderly multigravida, second trimester: Secondary | ICD-10-CM | POA: Diagnosis not present

## 2019-01-20 DIAGNOSIS — D573 Sickle-cell trait: Secondary | ICD-10-CM | POA: Diagnosis not present

## 2019-01-20 DIAGNOSIS — Z20828 Contact with and (suspected) exposure to other viral communicable diseases: Secondary | ICD-10-CM | POA: Diagnosis not present

## 2019-01-20 DIAGNOSIS — R0989 Other specified symptoms and signs involving the circulatory and respiratory systems: Secondary | ICD-10-CM

## 2019-01-20 DIAGNOSIS — J45909 Unspecified asthma, uncomplicated: Secondary | ICD-10-CM | POA: Diagnosis not present

## 2019-01-20 DIAGNOSIS — Z3A23 23 weeks gestation of pregnancy: Secondary | ICD-10-CM | POA: Diagnosis not present

## 2019-01-20 DIAGNOSIS — Z79899 Other long term (current) drug therapy: Secondary | ICD-10-CM | POA: Insufficient documentation

## 2019-01-20 DIAGNOSIS — J0100 Acute maxillary sinusitis, unspecified: Secondary | ICD-10-CM | POA: Insufficient documentation

## 2019-01-20 DIAGNOSIS — Z87891 Personal history of nicotine dependence: Secondary | ICD-10-CM | POA: Insufficient documentation

## 2019-01-20 DIAGNOSIS — O99512 Diseases of the respiratory system complicating pregnancy, second trimester: Secondary | ICD-10-CM | POA: Diagnosis present

## 2019-01-20 LAB — RESPIRATORY PANEL BY PCR

## 2019-01-20 LAB — URINALYSIS, ROUTINE W REFLEX MICROSCOPIC
Bilirubin Urine: NEGATIVE
Glucose, UA: NEGATIVE mg/dL
Hgb urine dipstick: NEGATIVE
Ketones, ur: NEGATIVE mg/dL
Leukocytes,Ua: NEGATIVE
Nitrite: NEGATIVE
Protein, ur: NEGATIVE mg/dL
Specific Gravity, Urine: 1.015 (ref 1.005–1.030)
pH: 6 (ref 5.0–8.0)

## 2019-01-20 LAB — SARS CORONAVIRUS 2 (TAT 6-24 HRS): SARS Coronavirus 2: NEGATIVE

## 2019-01-20 LAB — GROUP A STREP BY PCR: Group A Strep by PCR: NOT DETECTED

## 2019-01-20 MED ORDER — AMOXICILLIN-POT CLAVULANATE 875-125 MG PO TABS: 1.0000 | ORAL_TABLET | Freq: Two times a day (BID) | ORAL | 0 refills | Status: DC

## 2019-01-20 NOTE — MAU Provider Note (Signed)
History     CSN: [id number]  Arrival date and time: 01/20/19 1238   First Provider Initiated Contact with Patient 01/20/19 1431      Chief Complaint  Patient presents with  . Cough  . Nasal Congestion   38 y.o G3P1011 @23 .4 wks presenting for cough, sore throat, nasal congestion and body aches. Reports initial sx were cough, sore throat, and congestion about 3 weeks ago. She began having body aches last week. Denies fevers or chills. Cough is non-productive but having yellow thick mucous from nares. She has tried Sudafed and Mucinex without any relief. She reports SOB as described having to take deeper breaths. Has hx of asthma, is using inhaler 2-3 times a day. She denies sick contacts for common cold, flu, and covid. She had a negative Covid test last week. She did not receive the flu vaccine. Reports good FM. Denies VB, LOF, and ctx.   OB History    Gravida  3   Para  1   Term  1   Preterm      AB  1   Living  1     SAB      TAB  1   Ectopic      Multiple      Live Births  1           Past Medical History:  Diagnosis Date  . Asthma   . Hypertension   . Lupus (systemic lupus erythematosus) ([place])   . Sickle cell trait ([place])   . Vaginal Pap smear, abnormal     Past Surgical History:  Procedure Laterality Date  . CHOLECYSTECTOMY      Family History  Problem Relation Age of Onset  . Achondroplasia Mother   . Lupus Mother   . Sickle cell anemia Mother   . Hypertension Father   . Stroke Father   . Cancer Other     Social History   Tobacco Use  . Smoking status: Former Smoker    Packs/day: 0.00    Types: Cigarettes    Quit date: 01/09/2017    Years since quitting: 2.0  . Smokeless tobacco: Former Network engineer Use Topics  . Alcohol use: Not Currently  . Drug use: Not Currently    Types: Marijuana    Comment: Stopped August 5th, 2020    Allergies: No Known Allergies  No medications prior to admission.    Review of Systems   Constitutional: Negative for chills and fever.  HENT: Positive for congestion and sore throat. Negative for ear pain and sinus pain.   Respiratory: Positive for cough and shortness of breath.   Cardiovascular: Negative for chest pain.  Genitourinary: Negative for vaginal bleeding and vaginal discharge.  Neurological: Negative for headaches.   Physical Exam   Blood pressure (!) 123/56, pulse 96, temperature 99 F (37.2 C), temperature source Oral, resp. rate 19, height 5' 5"  (1.651 m), weight 88.8 kg, last menstrual period 08/13/2018, SpO2 100 %.  Physical Exam  Nursing note and vitals reviewed. Constitutional: She is oriented to person, place, and time. She appears well-developed and well-nourished. No distress.  HENT:  Head: Normocephalic and atraumatic.  Right Ear: Hearing, tympanic membrane, external ear and ear canal normal.  Left Ear: Hearing, tympanic membrane, external ear and ear canal normal.  Nose: Right sinus exhibits no maxillary sinus tenderness and no frontal sinus tenderness. Left sinus exhibits maxillary sinus tenderness. Left sinus exhibits no frontal sinus tenderness.  Mouth/Throat: Uvula is midline, oropharynx  is clear and moist and mucous membranes are normal.  Neck: Normal range of motion.  Cardiovascular: Normal rate, regular rhythm and normal heart sounds.  Respiratory: Effort normal and breath sounds normal. No accessory muscle usage. No respiratory distress. She has no wheezes. She has no rales.  GI: Soft. She exhibits no distension. There is no abdominal tenderness.  gravid  Musculoskeletal: Normal range of motion.  Neurological: She is alert and oriented to person, place, and time.  Skin: Skin is warm and dry.  Psychiatric: She has a normal mood and affect.  EFM: 155 bpm, mod variability, no accels, no decels Toco: none  Results for orders placed or performed during the hospital encounter of 01/20/19 (from the past 24 hour(s))  Group A Strep by PCR      Status: None   Collection Time: 01/20/19  3:12 PM   Specimen: Flu Kit Nasopharyngeal Swab; Sterile Swab  Result Value Ref Range   Group A Strep by PCR NOT DETECTED NOT DETECTED  Respiratory Panel by PCR     Status: Abnormal   Collection Time: 01/20/19  3:12 PM  Result Value Ref Range   Adenovirus NOT DETECTED NOT DETECTED   Coronavirus 229E NOT DETECTED NOT DETECTED   Coronavirus HKU1 NOT DETECTED NOT DETECTED   Coronavirus NL63 NOT DETECTED NOT DETECTED   Coronavirus OC43 NOT DETECTED NOT DETECTED   Metapneumovirus NOT DETECTED NOT DETECTED   Rhinovirus / Enterovirus DETECTED (A) NOT DETECTED   Influenza A NOT DETECTED NOT DETECTED   Influenza B NOT DETECTED NOT DETECTED   Parainfluenza Virus 1 NOT DETECTED NOT DETECTED   Parainfluenza Virus 2 NOT DETECTED NOT DETECTED   Parainfluenza Virus 3 NOT DETECTED NOT DETECTED   Parainfluenza Virus 4 NOT DETECTED NOT DETECTED   Respiratory Syncytial Virus NOT DETECTED NOT DETECTED   Bordetella pertussis NOT DETECTED NOT DETECTED   Chlamydophila pneumoniae NOT DETECTED NOT DETECTED   Mycoplasma pneumoniae NOT DETECTED NOT DETECTED  Urinalysis, Routine w reflex microscopic     Status: Abnormal   Collection Time: 01/20/19  3:22 PM  Result Value Ref Range   Color, Urine YELLOW YELLOW   APPearance HAZY (A) CLEAR   Specific Gravity, Urine 1.015 1.005 - 1.030   pH 6.0 5.0 - 8.0   Glucose, UA NEGATIVE NEGATIVE mg/dL   Hgb urine dipstick NEGATIVE NEGATIVE   Bilirubin Urine NEGATIVE NEGATIVE   Ketones, ur NEGATIVE NEGATIVE mg/dL   Protein, ur NEGATIVE NEGATIVE mg/dL   Nitrite NEGATIVE NEGATIVE   Leukocytes,Ua NEGATIVE NEGATIVE    MAU Course  Procedures  MDM Labs ordered and reviewed. Pt discharged with labs pending. Dx likely sinusitis. 1905: Pt notified of results, will treat for sinusitis. Covid pending.  Assessment and Plan   1. [redacted] weeks gestation of pregnancy   2. Upper respiratory symptom   3. Acute non-recurrent maxillary  sinusitis    Discharge home Follow up at [facility] as scheduled Continue Mucinex and Sudafed Rx Augmentin Return precautions: return to [facility] for worsening resp sx  Allergies as of 01/20/2019   No Known Allergies     Medication List    TAKE these medications   albuterol 108 (90 Base) MCG/ACT inhaler Commonly known as: VENTOLIN HFA Inhale 1-2 puffs into the lungs every 6 (six) hours as needed for wheezing or shortness of breath.   aspirin 81 MG chewable tablet Chew 1 tablet (81 mg total) by mouth daily.   Comfort Fit Maternity Supp Lg Misc 1 Units by Does not apply  route daily.   cyclobenzaprine 10 MG tablet Commonly known as: FLEXERIL Take 1 tablet (10 mg total) by mouth every 8 (eight) hours as needed for muscle spasms.   hydroxychloroquine 200 MG tablet Commonly known as: PLAQUENIL Take by mouth daily.   Iron-Vitamin C 65-125 MG Tabs Take 1 tablet by mouth every other day. 1 tablet on Monday, Wednesday, Friday   NIFEdipine 60 MG 24 hr tablet Commonly known as: ADALAT CC Take 1 tablet by mouth once daily   Niva-Plus 27-1 MG Tabs Take 1 tablet by mouth once daily   Vitamin D3 50 MCG (2000 UT) Tabs Take 1 capsule by mouth daily.       Julianne Handler, CNM 01/20/2019, 7:10 PM

## 2019-01-20 NOTE — Discharge Instructions (Signed)

## 2019-01-20 NOTE — MAU Note (Addendum)
Pt presents with cough, runny nose, congestion, sore throat and body aches x3 weeks. Has some short of breath. No chest pain. Some headaches.  No fever.  Had negative Covid test on Tuesday. Talked to provider 3 weeks ago and was told to take cold meds. Patient reports no relief of symptoms. Denies LOF, VB, +FM. IS patient at [facility]

## 2019-01-20 NOTE — Progress Notes (Signed)
Pt notified of neg test for influenza and strep. Recommend abx for sinusitis. Rx sent.

## 2019-01-27 ENCOUNTER — Encounter: Payer: Self-pay | Admitting: Obstetrics and Gynecology

## 2019-01-27 ENCOUNTER — Telehealth (INDEPENDENT_AMBULATORY_CARE_PROVIDER_SITE_OTHER): Payer: Medicaid Other | Admitting: Obstetrics and Gynecology

## 2019-01-27 VITALS — BP 105/80 | HR 93

## 2019-01-27 DIAGNOSIS — O10912 Unspecified pre-existing hypertension complicating pregnancy, second trimester: Secondary | ICD-10-CM

## 2019-01-27 DIAGNOSIS — O99012 Anemia complicating pregnancy, second trimester: Secondary | ICD-10-CM | POA: Diagnosis not present

## 2019-01-27 DIAGNOSIS — O0992 Supervision of high risk pregnancy, unspecified, second trimester: Secondary | ICD-10-CM | POA: Diagnosis not present

## 2019-01-27 DIAGNOSIS — O99019 Anemia complicating pregnancy, unspecified trimester: Secondary | ICD-10-CM

## 2019-01-27 DIAGNOSIS — D573 Sickle-cell trait: Secondary | ICD-10-CM

## 2019-01-27 DIAGNOSIS — O10919 Unspecified pre-existing hypertension complicating pregnancy, unspecified trimester: Secondary | ICD-10-CM

## 2019-01-27 DIAGNOSIS — M329 Systemic lupus erythematosus, unspecified: Secondary | ICD-10-CM | POA: Insufficient documentation

## 2019-01-27 DIAGNOSIS — O0990 Supervision of high risk pregnancy, unspecified, unspecified trimester: Secondary | ICD-10-CM

## 2019-01-27 DIAGNOSIS — Z3A24 24 weeks gestation of pregnancy: Secondary | ICD-10-CM

## 2019-01-27 NOTE — Progress Notes (Addendum)
   TELEHEALTH VIRTUAL OBSTETRICS VISIT ENCOUNTER NOTE  I connected with Natalie Chase on 01/27/19 at  9:15 AM EST by telephone at home and verified that I am speaking with the correct person using two identifiers.  I discussed the limitations, risks, security and privacy concerns of performing an evaluation and management service by telephone and the availability of in person appointments. I also discussed with the patient that there may be a patient responsible charge related to this service. The patient expressed understanding and agreed to proceed.  Subjective:  Natalie Chase is a 38 y.o. G3P1011 at [redacted]w[redacted]d being followed for ongoing prenatal care.  She is currently monitored for the following issues for this high-risk pregnancy and has Hypertension; Candida rash of groin; Supervision of high risk pregnancy, antepartum; Chronic hypertension affecting pregnancy; Sickle cell trait in mother affecting pregnancy ([place]); and Lupus (systemic lupus erythematosus) ([place]) on their problem list.  Patient reports no complaints. Reports fetal movement. Denies any contractions, bleeding or leaking of fluid.   The following portions of the patient's history were reviewed and updated as appropriate: allergies, current medications, past family history, past medical history, past social history, past surgical history and problem list.   Objective:   BP 105/80   Pulse 93   LMP 08/13/2018 (Approximate)   General:  Alert, oriented and cooperative.   Mental Status: Normal mood and affect perceived. Normal judgment and thought content.  Rest of physical exam deferred due to type of encounter  Assessment and Plan:  Pregnancy: G3P1011 at [redacted]w[redacted]d  1. Sickle cell trait in mother affecting pregnancy ([place]) Urine cultures Q trim  2. Supervision of high risk pregnancy, antepartum  3. Chronic hypertension affecting pregnancy Cont procardia 60 mg XL Cont baby ASA  4. Systemic lupus erythematosus, unspecified SLE  type, unspecified organ involvement status ([place]) Sees Rheumatology at [facility] On plaquenil 400 mg daily Anti-SSA/SSB, phospholipid ab, beta 2 GP ab, complement done at [facility] 11/28/18, all negative or stable - needs CMP/UPCr with 28 week labs  Preterm labor symptoms and general obstetric precautions including but not limited to vaginal bleeding, contractions, leaking of fluid and fetal movement were reviewed in detail with the patient.  I discussed the assessment and treatment plan with the patient. The patient was provided an opportunity to ask questions and all were answered. The patient agreed with the plan and demonstrated an understanding of the instructions. The patient was advised to call back or seek an in-person office evaluation/go to MAU at [facility] for any urgent or concerning symptoms. Please refer to After Visit Summary for other counseling recommendations.   I provided 20 minutes of non-face-to-face time during this encounter.  Return in about 2 weeks (around 02/10/2019) for high OB, virtual.  Future Appointments  Date Time Provider [place]  02/14/2019 10:45 AM [place] MFC-US  02/14/2019 10:45 AM WH-MFC [place] 2 WH-MFCUS MFC-US    Kelly M Davis, [place] for [place], [place]

## 2019-02-10 ENCOUNTER — Encounter: Payer: Self-pay | Admitting: Obstetrics and Gynecology

## 2019-02-10 ENCOUNTER — Telehealth (INDEPENDENT_AMBULATORY_CARE_PROVIDER_SITE_OTHER): Payer: Medicaid Other | Admitting: Obstetrics and Gynecology

## 2019-02-10 ENCOUNTER — Inpatient Hospital Stay (HOSPITAL_COMMUNITY)
Admission: AD | Admit: 2019-02-10 | Discharge: 2019-02-10 | Disposition: A | Discharge status: Home / Self Care | Payer: Medicaid Other | Attending: Obstetrics and Gynecology | Admitting: Obstetrics and Gynecology

## 2019-02-10 ENCOUNTER — Other Ambulatory Visit: Payer: Self-pay

## 2019-02-10 ENCOUNTER — Encounter (HOSPITAL_COMMUNITY): Payer: Self-pay | Admitting: Obstetrics and Gynecology

## 2019-02-10 DIAGNOSIS — O0990 Supervision of high risk pregnancy, unspecified, unspecified trimester: Secondary | ICD-10-CM

## 2019-02-10 DIAGNOSIS — Z8349 Family history of other endocrine, nutritional and metabolic diseases: Secondary | ICD-10-CM | POA: Insufficient documentation

## 2019-02-10 DIAGNOSIS — D573 Sickle-cell trait: Secondary | ICD-10-CM

## 2019-02-10 DIAGNOSIS — O0992 Supervision of high risk pregnancy, unspecified, second trimester: Secondary | ICD-10-CM | POA: Diagnosis not present

## 2019-02-10 DIAGNOSIS — J45909 Unspecified asthma, uncomplicated: Secondary | ICD-10-CM | POA: Insufficient documentation

## 2019-02-10 DIAGNOSIS — M329 Systemic lupus erythematosus, unspecified: Secondary | ICD-10-CM | POA: Diagnosis not present

## 2019-02-10 DIAGNOSIS — Z3A26 26 weeks gestation of pregnancy: Secondary | ICD-10-CM

## 2019-02-10 DIAGNOSIS — O99012 Anemia complicating pregnancy, second trimester: Secondary | ICD-10-CM | POA: Insufficient documentation

## 2019-02-10 DIAGNOSIS — O99019 Anemia complicating pregnancy, unspecified trimester: Secondary | ICD-10-CM

## 2019-02-10 DIAGNOSIS — Z79899 Other long term (current) drug therapy: Secondary | ICD-10-CM | POA: Insufficient documentation

## 2019-02-10 DIAGNOSIS — Z7982 Long term (current) use of aspirin: Secondary | ICD-10-CM | POA: Insufficient documentation

## 2019-02-10 DIAGNOSIS — O368120 Decreased fetal movements, second trimester, not applicable or unspecified: Secondary | ICD-10-CM | POA: Insufficient documentation

## 2019-02-10 DIAGNOSIS — O10919 Unspecified pre-existing hypertension complicating pregnancy, unspecified trimester: Secondary | ICD-10-CM

## 2019-02-10 DIAGNOSIS — Z823 Family history of stroke: Secondary | ICD-10-CM | POA: Diagnosis not present

## 2019-02-10 DIAGNOSIS — Z8249 Family history of ischemic heart disease and other diseases of the circulatory system: Secondary | ICD-10-CM | POA: Diagnosis not present

## 2019-02-10 DIAGNOSIS — Z87891 Personal history of nicotine dependence: Secondary | ICD-10-CM | POA: Diagnosis not present

## 2019-02-10 DIAGNOSIS — O162 Unspecified maternal hypertension, second trimester: Secondary | ICD-10-CM | POA: Diagnosis not present

## 2019-02-10 DIAGNOSIS — Z832 Family history of diseases of the blood and blood-forming organs and certain disorders involving the immune mechanism: Secondary | ICD-10-CM | POA: Diagnosis not present

## 2019-02-10 DIAGNOSIS — O99512 Diseases of the respiratory system complicating pregnancy, second trimester: Secondary | ICD-10-CM | POA: Insufficient documentation

## 2019-02-10 DIAGNOSIS — O10912 Unspecified pre-existing hypertension complicating pregnancy, second trimester: Secondary | ICD-10-CM | POA: Diagnosis not present

## 2019-02-10 DIAGNOSIS — O99112 Other diseases of the blood and blood-forming organs and certain disorders involving the immune mechanism complicating pregnancy, second trimester: Secondary | ICD-10-CM | POA: Insufficient documentation

## 2019-02-10 DIAGNOSIS — Z809 Family history of malignant neoplasm, unspecified: Secondary | ICD-10-CM | POA: Diagnosis not present

## 2019-02-10 LAB — URINALYSIS, ROUTINE W REFLEX MICROSCOPIC
Bacteria, UA: NONE SEEN
Bilirubin Urine: NEGATIVE
Glucose, UA: NEGATIVE mg/dL
Hgb urine dipstick: NEGATIVE
Ketones, ur: NEGATIVE mg/dL
Leukocytes,Ua: NEGATIVE
Nitrite: NEGATIVE
Protein, ur: NEGATIVE mg/dL
Specific Gravity, Urine: 1.011 (ref 1.005–1.030)
pH: 6 (ref 5.0–8.0)

## 2019-02-10 NOTE — MAU Note (Signed)
Pt is G3 P1 at [redacted]w[redacted]d reporting decreased fetal movement since last week. No pain, LOF, VB.  Last felt baby move normally last Tuesday. Was sent by provider after virtual visit today.

## 2019-02-10 NOTE — MAU Provider Note (Signed)
History   [id number]   Chief Complaint  Patient presents with  . Decreased Fetal Movement    HPI Ziah Turvey is a 38 y.o. female  G3P1011 here with report of decreased fetal movement since last week. States she's been feeling baby move 3-4 times per day for over a week now.  Reports feeling the baby move approximately 4 times in the past 24 hours.  Denies vaginal bleeding or leaking of fluid. Denies abdominal pain or contractions.   Patient's last menstrual period was 08/13/2018 (approximate).  OB History  Gravida Para Term Preterm AB Living  3 1 1   1 1   SAB TAB Ectopic Multiple Live Births    1     1    # Outcome Date GA Lbr Len/2nd Weight Sex Delivery Anes PTL Lv  3 Current           2 Term 11/30/02 [redacted]w[redacted]d  3118 g M Vag-Spont   LIV  1 TAB             Past Medical History:  Diagnosis Date  . Asthma   . Hypertension   . Lupus (systemic lupus erythematosus) ([place])   . Sickle cell trait ([place])   . Vaginal Pap smear, abnormal     Family History  Problem Relation Age of Onset  . Achondroplasia Mother   . Lupus Mother   . Sickle cell anemia Mother   . Hypertension Father   . Stroke Father   . Cancer Other     Social History   Socioeconomic History  . Marital status: Single    Spouse name: Not on file  . Number of children: 1  . Years of education: [employer]  . Highest education level: Some college, no degree  Occupational History  . Not on file  Tobacco Use  . Smoking status: Former Smoker    Packs/day: 0.00    Types: Cigarettes    Quit date: 01/09/2017    Years since quitting: 2.0  . Smokeless tobacco: Former Network engineer and Sexual Activity  . Alcohol use: Not Currently  . Drug use: Not Currently    Types: Marijuana    Comment: Stopped August 5th, 2020  . Sexual activity: Yes    Birth control/protection: None  Other Topics Concern  . Not on file  Social History Narrative  . Not on file   Social Determinants of Health   Financial Resource  Strain: Medium Risk  . Difficulty of Paying Living Expenses: Somewhat hard  Food Insecurity: No Food Insecurity  . Worried About Charity fundraiser in the Last Year: Never true  . Ran Out of Food in the Last Year: Never true  Transportation Needs: No Transportation Needs  . Lack of Transportation (Medical): No  . Lack of Transportation (Non-Medical): No  Physical Activity: Sufficiently Active  . Days of Exercise per Week: 3 days  . Minutes of Exercise per Session: 60 min  Stress: Stress Concern Present  . Feeling of Stress : To some extent  Social Connections: Somewhat Isolated  . Frequency of Communication with Friends and Family: More than three times a week  . Frequency of Social Gatherings with Friends and Family: More than three times a week  . Attends Religious Services: 1 to 4 times per year  . Active Member of Clubs or Organizations: No  . Attends Archivist Meetings: Never  . Marital Status: Never married    No Known Allergies  No current facility-administered  medications on file prior to encounter.   Current Outpatient Medications on File Prior to Encounter  Medication Sig Dispense Refill  . albuterol (PROVENTIL HFA;VENTOLIN HFA) 108 (90 Base) MCG/ACT inhaler Inhale 1-2 puffs into the lungs every 6 (six) hours as needed for wheezing or shortness of breath. 1 Inhaler 0  . aspirin 81 MG chewable tablet Chew 1 tablet (81 mg total) by mouth daily. 30 tablet 6  . Cholecalciferol (VITAMIN D3) 50 MCG (2000 UT) TABS Take 1 capsule by mouth daily.    . cyclobenzaprine (FLEXERIL) 10 MG tablet Take 1 tablet (10 mg total) by mouth every 8 (eight) hours as needed for muscle spasms. 30 tablet 1  . Elastic Bandages & Supports (COMFORT FIT MATERNITY SUPP LG) MISC 1 Units by Does not apply route daily. 1 each 0  . hydroxychloroquine (PLAQUENIL) 200 MG tablet Take by mouth daily.    Marland Kitchen. NIFEdipine (ADALAT CC) 60 MG 24 hr tablet Take 1 tablet by mouth once daily 30 tablet 0  .  Prenatal Vit-Fe Fumarate-FA (NIVA-PLUS) 27-1 MG TABS Take 1 tablet by mouth once daily 30 tablet 0  . Iron-Vitamin C 65-125 MG TABS Take 1 tablet by mouth every other day. 1 tablet on Monday, Wednesday, Friday       Review of Systems  Constitutional: Negative.   Gastrointestinal: Negative.   Genitourinary: Negative.    Physical Exam   Vitals:   02/10/19 1528 02/10/19 1625  BP: 119/66 139/70  Pulse: 66 (!) 102  Resp: 18 16  SpO2: 100%   Height: 5' 4.5" (1.638 m)     Physical Exam  Nursing note and vitals reviewed. Constitutional: She appears well-developed and well-nourished. No distress.  HENT:  Head: Normocephalic and atraumatic.  Respiratory: Effort normal. No respiratory distress.  GI: Soft. There is no abdominal tenderness.  Skin: Skin is warm and dry. She is not diaphoretic.  Psychiatric: She has a normal mood and affect. Her behavior is normal. Judgment and thought content normal.    MAU Course  Procedures Results for orders placed or performed during the hospital encounter of 02/10/19 (from the past 24 hour(s))  Urinalysis, Routine w reflex microscopic     Status: Abnormal   Collection Time: 02/10/19  3:56 PM  Result Value Ref Range   Color, Urine STRAW (A) YELLOW   APPearance CLEAR CLEAR   Specific Gravity, Urine 1.011 1.005 - 1.030   pH 6.0 5.0 - 8.0   Glucose, UA NEGATIVE NEGATIVE mg/dL   Hgb urine dipstick NEGATIVE NEGATIVE   Bilirubin Urine NEGATIVE NEGATIVE   Ketones, ur NEGATIVE NEGATIVE mg/dL   Protein, ur NEGATIVE NEGATIVE mg/dL   Nitrite NEGATIVE NEGATIVE   Leukocytes,Ua NEGATIVE NEGATIVE   RBC / HPF 0-5 0 - 5 RBC/hpf   WBC, UA 0-5 0 - 5 WBC/hpf   Bacteria, UA NONE SEEN NONE SEEN   Squamous Epithelial / LPF 0-5 0 - 5   Mucus PRESENT     MDM Fetal tracing appropriate for gestational age BSUS performed for patient reassurance.   Pt informed that the ultrasound is considered a limited OB ultrasound and is not intended to be a complete  ultrasound exam.  Patient also informed that the ultrasound is not being completed with the intent of assessing for fetal or placental anomalies or any pelvic abnormalities.  Explained that the purpose of today's ultrasound is to assess for  fetal movement.  Patient acknowledges the purpose of the exam and the limitations of the study.  Active  fetus seen on ultrasound.  Pt reassured by exam.   Assessment and Plan  A: 1. Decreased fetal movements in second trimester, single or unspecified fetus   2. [redacted] weeks gestation of pregnancy    P: Discharge home in stable condition Reviewed fetal movement expectations and reasons to return to MAU   Judeth Horn, NP 02/10/2019 7:08 PM

## 2019-02-10 NOTE — Progress Notes (Signed)
TELEHEALTH OBSTETRICS PRENATAL VIRTUAL VIDEO VISIT ENCOUNTER NOTE  Provider location: Center for [facility] at [place]   I connected with Natalie Chase on 02/10/19 at  3:45 PM EST by MyChart Video Encounter at home and verified that I am speaking with the correct person using two identifiers.   I discussed the limitations, risks, security and privacy concerns of performing an evaluation and management service virtually and the availability of in person appointments. I also discussed with the patient that there may be a patient responsible charge related to this service. The patient expressed understanding and agreed to proceed. Subjective:  Natalie Chase is a 38 y.o. G3P1011 at [redacted]w[redacted]d being seen today for ongoing prenatal care.  She is currently monitored for the following issues for this high-risk pregnancy and has Hypertension; Candida rash of groin; Supervision of high risk pregnancy, antepartum; Chronic hypertension affecting pregnancy; Sickle cell trait in mother affecting pregnancy ([place]); and Lupus (systemic lupus erythematosus) ([place]) on their problem list.  Patient reports no complaints.  Contractions: Irritability. Vag. Bleeding: None.  Movement: (!) Decreased. Denies any leaking of fluid.   The following portions of the patient's history were reviewed and updated as appropriate: allergies, current medications, past family history, past medical history, past social history, past surgical history and problem list.   Objective:  There were no vitals filed for this visit.  Fetal Status:     Movement: (!) Decreased     General:  Alert, oriented and cooperative. Patient is in no acute distress.  Respiratory: Normal respiratory effort, no problems with respiration noted  Mental Status: Normal mood and affect. Normal behavior. Normal judgment and thought content.  Rest of physical exam deferred due to type of encounter  Imaging: [place] MFM OB FOLLOW UP  Result Date:  01/13/2019 ----------------------------------------------------------------------  OBSTETRICS REPORT                       (Signed Final 01/13/2019 10:55 am) ---------------------------------------------------------------------- Patient Info  ID #:       [id number]                          D.O.B.:  Oct 19, 1980 (38 yrs)  Name:       Natalie Chase                   Visit Date: 01/13/2019 10:13 am ---------------------------------------------------------------------- Performed By  Performed By:     Berlinda Last          Ref. Address:     [address]                    [place], [place]  [id number]  Attending:        Noralee Spaceavi Shankar MD        [facility]  Referred By:      Raelyn MoraOLITTA                    DAWSON CNM ---------------------------------------------------------------------- Orders   #  Description                          Code         Ordered By   1  US MFM OB FOLLOW UP                  [id number]     YU FANG  ----------------------------------------------------------------------   #  Order #                    Accession #                 Episode #   1  [id number]                  [id number]                  [id number]  ---------------------------------------------------------------------- Indications   Advanced maternal age multigravida 7335+,        98O09.522   second trimester (LR NIPS)   [redacted] weeks gestation of pregnancy                Z3A.22   Hypertension - Chronic/Pre-existing            O10.019   (Procardia XL 60 mg qd, bASA)   Systemic lupus complicating pregnancy,         O26.892, M32.9   second trimester   Asthma (Albuterol)                             O99.89 j45.909   Genetic carrier (Sickle Cell Carrier)          Z14.8   Obesity  complicating pregnancy, second         O99.212   trimester (Pre Pregnancy BMI 32.45)   Marijuana Use (stopped 10/02/18)  ---------------------------------------------------------------------- Fetal Evaluation  Num Of Fetuses:         1  Fetal Heart Rate(bpm):  142  Cardiac Activity:       Observed  Presentation:           Cephalic  Placenta:               Anterior  P. Cord Insertion:      Previously Visualized  Amniotic Fluid  AFI FV:      Within normal limits                              Largest Pocket(cm)  6.1 ---------------------------------------------------------------------- Biometry  BPD:      54.4  mm     G. Age:  22w 4d         46  %    CI:        71.18   %    70 - 86                                                          FL/HC:      18.8   %    19.2 - 20.8  HC:      205.4  mm     G. Age:  22w 5d         40  %    HC/AC:      1.11        1.05 - 1.21  AC:      184.3  mm     G. Age:  23w 2d         64  %    FL/BPD:     71.0   %    71 - 87  FL:       38.6  mm     G. Age:  22w 3d         33  %    FL/AC:      20.9   %    20 - 24  HUM:      37.6  mm     G. Age:  23w 1d         60  %  Est. FW:     538  gm      1 lb 3 oz     56  % ---------------------------------------------------------------------- OB History  Gravidity:    3         Term:   1  TOP:          1        Living:  1 ---------------------------------------------------------------------- Gestational Age  LMP:           21w 6d        Date:  08/13/18                 EDD:   05/20/19  U/S Today:     22w 5d                                        EDD:   05/14/19  Best:          22w 4d     Det. ByMarcella Dubs         EDD:   05/15/19                                      (10/28/18) ---------------------------------------------------------------------- Anatomy  Cranium:               Appears normal         Aortic Arch:            Previously seen  Cavum:  Appears normal         Ductal Arch:            Appears  normal  Ventricles:            Appears normal         Diaphragm:              Appears normal  Choroid Plexus:        Appears normal         Stomach:                Appears normal, left                                                                        sided  Cerebellum:            Appears normal         Abdomen:                Appears normal  Posterior Fossa:       Appears normal         Abdominal Wall:         Previously seen  Nuchal Fold:           Previously seen        Cord Vessels:           Previously seen  Face:                  Orbits and profile     Kidneys:                Appear normal                         previously seen  Lips:                  Previously seen        Bladder:                Appears normal  Thoracic:              Appears normal         Spine:                  Previously seen  Heart:                 Appears normal         Upper Extremities:      Previously seen                         (4CH, axis, and                         situs)  RVOT:                  Appears normal         Lower Extremities:      Previously seen  LVOT:                  Appears normal  Other:  Fetus appears to be  a female. Heels previously visualized. Nasal bone          previously visualized. Technically difficult due to maternal habitus and          fetal position and fetal movement. ---------------------------------------------------------------------- Cervix Uterus Adnexa  Cervix  Length:            3.2  cm.  Normal appearance by transabdominal scan. ---------------------------------------------------------------------- Impression  Chronic hypertension. Well-controlled on nifedipine. Patient  has SLE and takes plaquenil.  She does not have symptoms of flare.  On cell-free fetal DNA screening, the risks of fetal  aneuploidies are not increased.  Fetal growth is appropriate for gestational age. Amniotic fluid  is normal and good fetal activity is seen. CSP is seen and  appears normal. Cardiac anatomy appears  normal. ---------------------------------------------------------------------- Recommendations  -An appointment was made for her to return in 4 weeks for  fetal growth assessment. ----------------------------------------------------------------------                  Noralee Space, MD Electronically Signed Final Report   01/13/2019 10:55 am ----------------------------------------------------------------------   Assessment and Plan:  Pregnancy: G3P1011 at [redacted]w[redacted]d 1. Sickle cell trait in mother affecting pregnancy (HCC) FOB has not been tested   2. Supervision of high risk pregnancy, antepartum Patient is doing well but reports decreased/absent fetal movement Patient has been instructed to present to MAU Patient does not plan to take glucola next visit but would rather discuss a natural approach. Discussed CBG monitoring for 2 weeks instead  3. Chronic hypertension affecting pregnancy Continue procardia COntinue ASA  4. Systemic lupus erythematosus, unspecified SLE type, unspecified organ involvement status (HCC) Stable on plaquenil UPC ratio and CMP next visit  Preterm labor symptoms and general obstetric precautions including but not limited to vaginal bleeding, contractions, leaking of fluid and fetal movement were reviewed in detail with the patient. I discussed the assessment and treatment plan with the patient. The patient was provided an opportunity to ask questions and all were answered. The patient agreed with the plan and demonstrated an understanding of the instructions. The patient was advised to call back or seek an in-person office evaluation/go to [facility]U at [facility] for any urgent or concerning symptoms. Please refer to After Visit Summary for other counseling recommendations.   I provided 11 minutes of face-to-face time during this encounter.  No follow-ups on file.  Future Appointments  Date Time Provider Department Center  02/10/2019  3:45 PM Mumtaz Lovins,  Gigi Gin, MD [facility] None  02/14/2019 10:45 AM [facility] NURSE [facility] MFC-US  02/14/2019 10:45 AM [facility] [place] 2 WH-M[facility]US MFC-US    Catalina Antigua, MD Center for [employer], [facility]

## 2019-02-10 NOTE — Discharge Instructions (Signed)

## 2019-02-10 NOTE — Progress Notes (Signed)
Pt presents for mychart visit for ROB. Pt identified with 2 pt identifiers. Pt is [redacted]w[redacted]d.Pt states that she has noticed a decrease in fetal movement. She states that she noticed the decrease about a week ago. Pt states that she has increased fluid intake and has tried eating something sweet, but no increase in movement.

## 2019-02-14 ENCOUNTER — Ambulatory Visit (HOSPITAL_COMMUNITY)
Admission: RE | Admit: 2019-02-14 | Discharge: 2019-02-14 | Disposition: A | Discharge status: Home / Self Care | Payer: Medicaid Other | Source: Ambulatory Visit | Attending: Obstetrics and Gynecology | Admitting: Obstetrics and Gynecology

## 2019-02-14 ENCOUNTER — Encounter (HOSPITAL_COMMUNITY): Payer: Self-pay

## 2019-02-14 ENCOUNTER — Other Ambulatory Visit (HOSPITAL_COMMUNITY): Payer: Self-pay | Admitting: *Deleted

## 2019-02-14 ENCOUNTER — Ambulatory Visit (HOSPITAL_COMMUNITY): Payer: Medicaid Other | Admitting: *Deleted

## 2019-02-14 ENCOUNTER — Other Ambulatory Visit: Payer: Self-pay

## 2019-02-14 DIAGNOSIS — O0990 Supervision of high risk pregnancy, unspecified, unspecified trimester: Secondary | ICD-10-CM | POA: Insufficient documentation

## 2019-02-14 DIAGNOSIS — O99019 Anemia complicating pregnancy, unspecified trimester: Secondary | ICD-10-CM

## 2019-02-14 DIAGNOSIS — O10919 Unspecified pre-existing hypertension complicating pregnancy, unspecified trimester: Secondary | ICD-10-CM

## 2019-02-14 DIAGNOSIS — O09522 Supervision of elderly multigravida, second trimester: Secondary | ICD-10-CM | POA: Diagnosis not present

## 2019-02-14 DIAGNOSIS — M329 Systemic lupus erythematosus, unspecified: Secondary | ICD-10-CM | POA: Diagnosis present

## 2019-02-14 DIAGNOSIS — J45909 Unspecified asthma, uncomplicated: Secondary | ICD-10-CM

## 2019-02-14 DIAGNOSIS — O99891 Other specified diseases and conditions complicating pregnancy: Secondary | ICD-10-CM

## 2019-02-14 DIAGNOSIS — O99212 Obesity complicating pregnancy, second trimester: Secondary | ICD-10-CM

## 2019-02-14 DIAGNOSIS — O10012 Pre-existing essential hypertension complicating pregnancy, second trimester: Secondary | ICD-10-CM | POA: Diagnosis not present

## 2019-02-14 DIAGNOSIS — D573 Sickle-cell trait: Secondary | ICD-10-CM | POA: Diagnosis present

## 2019-02-14 DIAGNOSIS — O26892 Other specified pregnancy related conditions, second trimester: Secondary | ICD-10-CM

## 2019-02-14 DIAGNOSIS — O09529 Supervision of elderly multigravida, unspecified trimester: Secondary | ICD-10-CM | POA: Diagnosis present

## 2019-02-14 DIAGNOSIS — Z362 Encounter for other antenatal screening follow-up: Secondary | ICD-10-CM | POA: Diagnosis not present

## 2019-02-14 DIAGNOSIS — Z3A27 27 weeks gestation of pregnancy: Secondary | ICD-10-CM

## 2019-02-22 ENCOUNTER — Other Ambulatory Visit (INDEPENDENT_AMBULATORY_CARE_PROVIDER_SITE_OTHER): Payer: Self-pay | Admitting: Obstetrics and Gynecology

## 2019-02-28 NOTE — L&D Delivery Note (Signed)
Natalie Chase is a 39 y.o. female G3P1011 with IUP at [redacted]w[redacted]d admitted for IOL for CHTN and SLE .  She progressed with cytotec augmentation to complete and pushed 45 minutes to deliver.  Cord clamping delayed by several minutes then clamped by CNM and cut by FOB.    Delivery Note At 4:05 PM a viable female was delivered via Vaginal, Spontaneous (Presentation:   Occiput Posterior).  APGAR: 8, 9; weight  pending.   Placenta status: Spontaneous, Intact.  Cord: 3 vessels with the following complications: None.  After delivery of placenta, brisk bright red bleeding noted. Uterus firm with massage but boggy and brisk bleeding without. TXA given IV. Bleeding stopped quickly and uterus firm.  Anesthesia: Epidural Episiotomy: None Lacerations: 2nd degree Suture Repair: 3.0 vicryl Est. Blood Loss (mL):  300  Mom to postpartum.  Baby to Couplet care / Skin to Skin.  Rolm Bookbinder CNM 05/11/2019, 4:28 PM

## 2019-03-13 ENCOUNTER — Ambulatory Visit (INDEPENDENT_AMBULATORY_CARE_PROVIDER_SITE_OTHER): Payer: Medicaid Other | Admitting: Family Medicine

## 2019-03-13 ENCOUNTER — Other Ambulatory Visit: Payer: Self-pay

## 2019-03-13 ENCOUNTER — Other Ambulatory Visit (HOSPITAL_COMMUNITY)
Admission: RE | Admit: 2019-03-13 | Discharge: 2019-03-13 | Disposition: A | Discharge status: Home / Self Care | Payer: Medicaid Other | Source: Ambulatory Visit | Attending: Family Medicine | Admitting: Family Medicine

## 2019-03-13 VITALS — BP 125/63 | HR 123 | Wt 207.4 lb

## 2019-03-13 DIAGNOSIS — O0990 Supervision of high risk pregnancy, unspecified, unspecified trimester: Secondary | ICD-10-CM

## 2019-03-13 DIAGNOSIS — N898 Other specified noninflammatory disorders of vagina: Secondary | ICD-10-CM | POA: Diagnosis not present

## 2019-03-13 DIAGNOSIS — D573 Sickle-cell trait: Secondary | ICD-10-CM

## 2019-03-13 DIAGNOSIS — O10913 Unspecified pre-existing hypertension complicating pregnancy, third trimester: Secondary | ICD-10-CM

## 2019-03-13 DIAGNOSIS — O99013 Anemia complicating pregnancy, third trimester: Secondary | ICD-10-CM

## 2019-03-13 DIAGNOSIS — O0993 Supervision of high risk pregnancy, unspecified, third trimester: Secondary | ICD-10-CM

## 2019-03-13 DIAGNOSIS — O10919 Unspecified pre-existing hypertension complicating pregnancy, unspecified trimester: Secondary | ICD-10-CM

## 2019-03-13 DIAGNOSIS — Z3A31 31 weeks gestation of pregnancy: Secondary | ICD-10-CM

## 2019-03-13 DIAGNOSIS — M329 Systemic lupus erythematosus, unspecified: Secondary | ICD-10-CM

## 2019-03-13 DIAGNOSIS — O99019 Anemia complicating pregnancy, unspecified trimester: Secondary | ICD-10-CM

## 2019-03-13 MED ORDER — ACCU-CHEK GUIDE VI STRP: ORAL_STRIP | 12 refills | Status: DC

## 2019-03-13 MED ORDER — ACCU-CHEK GUIDE ME W/DEVICE KIT: 1.0000 | PACK | Freq: Four times a day (QID) | 0 refills | Status: DC

## 2019-03-13 MED ORDER — ACCU-CHEK FASTCLIX LANCETS MISC: 1.0000 | Freq: Four times a day (QID) | 3 refills | Status: DC

## 2019-03-13 NOTE — Patient Instructions (Addendum)

## 2019-03-13 NOTE — Progress Notes (Addendum)
Subjective:  Natalie Chase is a 39 y.o. G3P1011 at [redacted]w[redacted]d being seen today for ongoing prenatal care.  She is currently monitored for the following issues for this high-risk pregnancy and has Hypertension; Candida rash of groin; Supervision of high risk pregnancy, antepartum; Chronic hypertension affecting pregnancy; Sickle cell trait in mother affecting pregnancy (HCC); and Lupus (systemic lupus erythematosus) (HCC) on their problem list.  Patient reports vaginal odor.  Contractions: Irritability. Vag. Bleeding: None.  Movement: Present. Denies leaking of fluid.   The following portions of the patient's history were reviewed and updated as appropriate: allergies, current medications, past family history, past medical history, past social history, past surgical history and problem list. Problem list updated.  Objective:   Vitals:   03/13/19 1519  BP: 125/63  Pulse: (!) 123  Weight: 207 lb 6.4 oz (94.1 kg)    Fetal Status: Fetal Heart Rate (bpm): 155 Fundal Height: 32 cm Movement: Present     General:  Alert, oriented and cooperative. Patient is in no acute distress.  Skin: Skin is warm and dry. No rash noted.   Cardiovascular: Normal heart rate noted  Respiratory: Normal respiratory effort, no problems with respiration noted  Abdomen: Soft, gravid, appropriate for gestational age. Pain/Pressure: Absent     Pelvic: Vag. Bleeding: None     Cervical exam deferred        Extremities: Normal range of motion.  Edema: Trace  Mental Status: Normal mood and affect. Normal behavior. Normal judgment and thought content.   Urinalysis:       Vaginal exam chaperoned by Angelica Pou, CMA  Assessment and Plan:  Pregnancy: G3P1011 at [redacted]w[redacted]d  1. Supervision of high risk pregnancy, antepartum - Has not done glucola - Follow up US on 1/22/2, per Dr. Deretha Emory note on 12/14 patient would rather do fasting and postprandial  - CBG testing supplies sent - Patient advised that abnormal results may  require treatment  2. Vaginal odor - Cervicovaginal ancillary only( [place])  3. Sickle cell trait in mother affecting pregnancy (HCC) - FOB not tested, but GOB (FOB's mom) confirmed that he is a carrier for sickle cell trait  4. Systemic lupus erythematosus, unspecified SLE type, unspecified organ involvement status (HCC) - baseline P:Cr and CMP today  5. Chronic hypertension affecting pregnancy - on ASA 81 mg and Procardia - Bps reviewed, no severe range pressures. Reports 120s-130s/70s-80s  Preterm labor symptoms and general obstetric precautions including but not limited to vaginal bleeding, contractions, leaking of fluid and fetal movement were reviewed in detail with the patient. Please refer to After Visit Summary for other counseling recommendations.  Return in about 2 weeks (around 03/27/2019) for [facility].   Jamilya Sarrazin L, DO

## 2019-03-13 NOTE — Progress Notes (Signed)
Pt presents for ROB. Pt states that she has noticed a vaginal odor no discharge, and would like std testing as well as yeast and bv. Pt has no other concerns.

## 2019-03-14 LAB — COMPREHENSIVE METABOLIC PANEL
ALT: 27 IU/L (ref 0–32)
AST: 22 IU/L (ref 0–40)
Albumin/Globulin Ratio: 1.4 (ref 1.2–2.2)
Albumin: 3.7 g/dL — ABNORMAL LOW (ref 3.8–4.8)
Alkaline Phosphatase: 101 IU/L (ref 39–117)
BUN/Creatinine Ratio: 8 — ABNORMAL LOW (ref 9–23)
BUN: 8 mg/dL (ref 6–20)
Bilirubin Total: <0.2 mg/dL (ref 0.0–1.2)
CO2: 22 mmol/L (ref 20–29)
Calcium: 9.2 mg/dL (ref 8.7–10.2)
Chloride: 103 mmol/L (ref 96–106)
Creatinine, Ser: 1.03 mg/dL — ABNORMAL HIGH (ref 0.57–1.00)
GFR calc Af Amer: 81 mL/min/{1.73_m2} (ref >59)
GFR calc non Af Amer: 70 mL/min/{1.73_m2} (ref >59)
Globulin, Total: 2.7 g/dL (ref 1.5–4.5)
Glucose: 99 mg/dL (ref 65–99)
Potassium: 3.8 mmol/L (ref 3.5–5.2)
Sodium: 139 mmol/L (ref 134–144)
Total Protein: 6.4 g/dL (ref 6.0–8.5)

## 2019-03-14 LAB — PROTEIN / CREATININE RATIO, URINE
Creatinine, Urine: 127.3 mg/dL
Protein, Ur: 9.6 mg/dL
Protein/Creat Ratio: 75 mg/g creat (ref 0–200)

## 2019-03-17 LAB — CERVICOVAGINAL ANCILLARY ONLY
Bacterial Vaginitis (gardnerella): NEGATIVE
Candida Glabrata: NEGATIVE
Candida Vaginitis: NEGATIVE
Chlamydia: NEGATIVE
Comment: NEGATIVE
Comment: NEGATIVE
Comment: NEGATIVE
Comment: NEGATIVE
Comment: NEGATIVE
Comment: NORMAL
Neisseria Gonorrhea: NEGATIVE
Trichomonas: NEGATIVE

## 2019-03-21 ENCOUNTER — Other Ambulatory Visit: Payer: Self-pay

## 2019-03-21 ENCOUNTER — Ambulatory Visit (HOSPITAL_COMMUNITY): Payer: Medicaid Other | Admitting: *Deleted

## 2019-03-21 ENCOUNTER — Ambulatory Visit (HOSPITAL_COMMUNITY)
Admission: RE | Admit: 2019-03-21 | Discharge: 2019-03-21 | Disposition: A | Discharge status: Home / Self Care | Payer: Medicaid Other | Source: Ambulatory Visit | Attending: Obstetrics and Gynecology | Admitting: Obstetrics and Gynecology

## 2019-03-21 ENCOUNTER — Encounter (HOSPITAL_COMMUNITY): Payer: Self-pay

## 2019-03-21 DIAGNOSIS — J45909 Unspecified asthma, uncomplicated: Secondary | ICD-10-CM

## 2019-03-21 DIAGNOSIS — O26893 Other specified pregnancy related conditions, third trimester: Secondary | ICD-10-CM | POA: Diagnosis not present

## 2019-03-21 DIAGNOSIS — M329 Systemic lupus erythematosus, unspecified: Secondary | ICD-10-CM

## 2019-03-21 DIAGNOSIS — Z362 Encounter for other antenatal screening follow-up: Secondary | ICD-10-CM

## 2019-03-21 DIAGNOSIS — O0990 Supervision of high risk pregnancy, unspecified, unspecified trimester: Secondary | ICD-10-CM

## 2019-03-21 DIAGNOSIS — O10919 Unspecified pre-existing hypertension complicating pregnancy, unspecified trimester: Secondary | ICD-10-CM | POA: Insufficient documentation

## 2019-03-21 DIAGNOSIS — O09523 Supervision of elderly multigravida, third trimester: Secondary | ICD-10-CM

## 2019-03-21 DIAGNOSIS — O10013 Pre-existing essential hypertension complicating pregnancy, third trimester: Secondary | ICD-10-CM

## 2019-03-21 DIAGNOSIS — D573 Sickle-cell trait: Secondary | ICD-10-CM

## 2019-03-21 DIAGNOSIS — O99019 Anemia complicating pregnancy, unspecified trimester: Secondary | ICD-10-CM | POA: Diagnosis present

## 2019-03-21 DIAGNOSIS — O99891 Other specified diseases and conditions complicating pregnancy: Secondary | ICD-10-CM

## 2019-03-21 DIAGNOSIS — O99213 Obesity complicating pregnancy, third trimester: Secondary | ICD-10-CM

## 2019-03-21 DIAGNOSIS — Z3A32 32 weeks gestation of pregnancy: Secondary | ICD-10-CM

## 2019-03-25 ENCOUNTER — Encounter: Payer: Self-pay | Admitting: *Deleted

## 2019-03-27 ENCOUNTER — Other Ambulatory Visit: Payer: Self-pay

## 2019-03-27 ENCOUNTER — Ambulatory Visit (INDEPENDENT_AMBULATORY_CARE_PROVIDER_SITE_OTHER): Payer: Medicaid Other

## 2019-03-27 VITALS — BP 116/67 | HR 98 | Wt 207.8 lb

## 2019-03-27 DIAGNOSIS — O0990 Supervision of high risk pregnancy, unspecified, unspecified trimester: Secondary | ICD-10-CM

## 2019-03-27 DIAGNOSIS — O10919 Unspecified pre-existing hypertension complicating pregnancy, unspecified trimester: Secondary | ICD-10-CM

## 2019-03-27 DIAGNOSIS — O0993 Supervision of high risk pregnancy, unspecified, third trimester: Secondary | ICD-10-CM

## 2019-03-27 DIAGNOSIS — Z3A33 33 weeks gestation of pregnancy: Secondary | ICD-10-CM

## 2019-03-27 DIAGNOSIS — O10913 Unspecified pre-existing hypertension complicating pregnancy, third trimester: Secondary | ICD-10-CM

## 2019-03-27 NOTE — Progress Notes (Signed)
Pt presents for ROB. Pt has no concerns today. She did bring her glucose readings with her.

## 2019-03-27 NOTE — Patient Instructions (Signed)
Carbohydrate Counting for Diabetes Mellitus, Adult  Carbohydrate counting is a method of keeping track of how many carbohydrates you eat. Eating carbohydrates naturally increases the amount of sugar (glucose) in the blood. Counting how many carbohydrates you eat helps keep your blood glucose within normal limits, which helps you manage your diabetes (diabetes mellitus). It is important to know how many carbohydrates you can safely have in each meal. This is different for every person. A diet and nutrition specialist (registered dietitian) can help you make a meal plan and calculate how many carbohydrates you should have at each meal and snack. Carbohydrates are found in the following foods:  Grains, such as breads and cereals.  Dried beans and soy products.  Starchy vegetables, such as potatoes, peas, and corn.  Fruit and fruit juices.  Milk and yogurt.  Sweets and snack foods, such as cake, cookies, candy, chips, and soft drinks. How do I count carbohydrates? There are two ways to count carbohydrates in food. You can use either of the methods or a combination of both. Reading "Nutrition Facts" on packaged food The "Nutrition Facts" list is included on the labels of almost all packaged foods and beverages in the [place] It includes:  The serving size.  Information about nutrients in each serving, including the grams (g) of carbohydrate per serving. To use the "Nutrition Facts":  Decide how many servings you will have.  Multiply the number of servings by the number of carbohydrates per serving.  The resulting number is the total amount of carbohydrates that you will be having. Learning standard serving sizes of other foods When you eat carbohydrate foods that are not packaged or do not include "Nutrition Facts" on the label, you need to measure the servings in order to count the amount of carbohydrates:  Measure the foods that you will eat with a food scale or measuring cup, if  needed.  Decide how many standard-size servings you will eat.  Multiply the number of servings by 15. Most carbohydrate-rich foods have about 15 g of carbohydrates per serving. ? For example, if you eat 8 oz (170 g) of strawberries, you will have eaten 2 servings and 30 g of carbohydrates (2 servings x 15 g = 30 g).  For foods that have more than one food mixed, such as soups and casseroles, you must count the carbohydrates in each food that is included. The following list contains standard serving sizes of common carbohydrate-rich foods. Each of these servings has about 15 g of carbohydrates:   hamburger bun or  English muffin.   oz (15 mL) syrup.   oz (14 g) jelly.  1 slice of bread.  1 six-inch tortilla.  3 oz (85 g) cooked rice or pasta.  4 oz (113 g) cooked dried beans.  4 oz (113 g) starchy vegetable, such as peas, corn, or potatoes.  4 oz (113 g) hot cereal.  4 oz (113 g) mashed potatoes or  of a large baked potato.  4 oz (113 g) canned or frozen fruit.  4 oz (120 mL) fruit juice.  4-6 crackers.  6 chicken nuggets.  6 oz (170 g) unsweetened dry cereal.  6 oz (170 g) plain fat-free yogurt or yogurt sweetened with artificial sweeteners.  8 oz (240 mL) milk.  8 oz (170 g) fresh fruit or one small piece of fruit.  24 oz (680 g) popped popcorn. Example of carbohydrate counting Sample meal  3 oz (85 g) chicken breast.  6 oz (170 g)   brown rice.  4 oz (113 g) corn.  8 oz (240 mL) milk.  8 oz (170 g) strawberries with sugar-free whipped topping. Carbohydrate calculation 1. Identify the foods that contain carbohydrates: ? Rice. ? Corn. ? Milk. ? Strawberries. 2. Calculate how many servings you have of each food: ? 2 servings rice. ? 1 serving corn. ? 1 serving milk. ? 1 serving strawberries. 3. Multiply each number of servings by 15 g: ? 2 servings rice x 15 g = 30 g. ? 1 serving corn x 15 g = 15 g. ? 1 serving milk x 15 g = 15 g. ? 1  serving strawberries x 15 g = 15 g. 4. Add together all of the amounts to find the total grams of carbohydrates eaten: ? 30 g + 15 g + 15 g + 15 g = 75 g of carbohydrates total. Summary  Carbohydrate counting is a method of keeping track of how many carbohydrates you eat.  Eating carbohydrates naturally increases the amount of sugar (glucose) in the blood.  Counting how many carbohydrates you eat helps keep your blood glucose within normal limits, which helps you manage your diabetes.  A diet and nutrition specialist (registered dietitian) can help you make a meal plan and calculate how many carbohydrates you should have at each meal and snack. This information is not intended to replace advice given to you by your health care provider. Make sure you discuss any questions you have with your health care provider. Document Revised: 09/07/2016 Document Reviewed: 07/28/2015 Elsevier Patient Education  2020 Elsevier Inc.  

## 2019-03-27 NOTE — Progress Notes (Signed)
   PRENATAL VISIT NOTE  Subjective:  Natalie Chase is a 39 y.o. G3P1011 at [redacted]w[redacted]d who presents today for routine prenatal care.  She is currently being monitored for supervision of a high-risk pregnancy with problems as listed below.  Patient has no pregnancy related concerns and endorses fetal movement and intermittent contractions.  She denies vaginal concerns including discharge, bleeding, leaking, itching, and burning.   She has been complaint with medications and monitoring of her blood glucose.  When asked to recall her meals from yesterday she states she ate a boiled egg and toast (B), grilled chicken salad with tomatoes, chz, cucumber, and italian dressing (L), then salmon with brussel sprouts (D). It is noted that she carries a gallon water bottle with her.  Her blood sugars have ranged from:  FS: 66, 82-92  Breakfast: 72, 89, 102, 103, 115-131, 167 Lunch: 82, 94, 102-121, 128 Dinner: 82, 100, 117-137 11 elevations overall out of ~32-No fastings elevated  Patient questions when she will be scheduled for induction.   Patient Active Problem List   Diagnosis Date Noted  . Lupus (systemic lupus erythematosus) (HCC)   . Sickle cell trait in mother affecting pregnancy (HCC) 11/14/2018  . Supervision of high risk pregnancy, antepartum 10/29/2018  . Chronic hypertension affecting pregnancy 10/29/2018  . Candida rash of groin 08/08/2018  . Hypertension 06/11/2017    The following portions of the patient's history were reviewed and updated as appropriate: allergies, current medications, past family history, past medical history, past social history, past surgical history and problem list. Problem list updated.  Objective:   Vitals:   03/27/19 1451  BP: 116/67  Pulse: 98  Weight: 207 lb 12.8 oz (94.3 kg)    Fetal Status: Fetal Heart Rate (bpm): 140 Fundal Height: 34 cm Movement: Present     General:  Alert, oriented and cooperative. Patient is in no acute distress.  Skin: Skin is  warm and dry.   Cardiovascular: Regular rate and rhythm.  Respiratory: Normal respiratory effort.  Abdomen: Soft, gravid, appropriate for gestational age.  Pelvic: Cervical exam deferred        Extremities: Normal range of motion.  Edema: None  Mental Status: Normal mood and affect. Normal behavior. Normal judgment and thought content.   Assessment and Plan:  Pregnancy: G3P1011 at [redacted]w[redacted]d  1. Supervision of high risk pregnancy, antepartum -Reviewed blood glucoses and informed that overall good, but elevations still present. -Encouraged to continue to monitor intake and modify diet based on glucose findings. -Instructed to continue monitoring sugars and MD will reevaluate at next visit. -Encouraged to write down meals as well. -Informed that induction would be between 38-40 week unless other issues arise requiring earlier intervention.    2. Chronic hypertension affecting pregnancy -BP stable -Reviewed most recent BPP of 8/8 with infant in 55% at 4lbs 7oz   Preterm labor symptoms and general obstetric precautions including but not limited to vaginal bleeding, contractions, leaking of fluid and fetal movement were reviewed with the patient.  Please refer to After Visit Summary for other counseling recommendations.  Return in about 3 weeks (around 04/17/2019) for HR-ROB with GBS.  Future Appointments  Date Time Provider Department Center  03/28/2019  3:30 PM [facility] NURSE [facility] MFC-US  03/28/2019  3:30 PM [facility] [place] 1 WH-MFCUS MFC-US  04/04/2019  3:30 PM [facility] NURSE [facility] MFC-US  04/04/2019  3:30 PM [facility] [place] 1 WH-MFCUS MFC-US    Cherre Robins, CNM 03/27/2019, 3:17 PM

## 2019-03-28 ENCOUNTER — Ambulatory Visit (HOSPITAL_COMMUNITY)
Admission: RE | Admit: 2019-03-28 | Discharge: 2019-03-28 | Disposition: A | Discharge status: Home / Self Care | Payer: Medicaid Other | Source: Ambulatory Visit | Attending: Obstetrics and Gynecology | Admitting: Obstetrics and Gynecology

## 2019-03-28 ENCOUNTER — Encounter (HOSPITAL_COMMUNITY): Payer: Self-pay

## 2019-03-28 ENCOUNTER — Ambulatory Visit (HOSPITAL_COMMUNITY): Payer: Medicaid Other | Admitting: *Deleted

## 2019-03-28 DIAGNOSIS — O10919 Unspecified pre-existing hypertension complicating pregnancy, unspecified trimester: Secondary | ICD-10-CM | POA: Diagnosis present

## 2019-03-28 DIAGNOSIS — O0990 Supervision of high risk pregnancy, unspecified, unspecified trimester: Secondary | ICD-10-CM | POA: Insufficient documentation

## 2019-03-28 DIAGNOSIS — O10013 Pre-existing essential hypertension complicating pregnancy, third trimester: Secondary | ICD-10-CM | POA: Diagnosis not present

## 2019-03-28 DIAGNOSIS — O99019 Anemia complicating pregnancy, unspecified trimester: Secondary | ICD-10-CM | POA: Diagnosis present

## 2019-03-28 DIAGNOSIS — M329 Systemic lupus erythematosus, unspecified: Secondary | ICD-10-CM | POA: Diagnosis not present

## 2019-03-28 DIAGNOSIS — O09523 Supervision of elderly multigravida, third trimester: Secondary | ICD-10-CM

## 2019-03-28 DIAGNOSIS — O26893 Other specified pregnancy related conditions, third trimester: Secondary | ICD-10-CM | POA: Diagnosis not present

## 2019-03-28 DIAGNOSIS — D573 Sickle-cell trait: Secondary | ICD-10-CM

## 2019-03-28 DIAGNOSIS — Z3A33 33 weeks gestation of pregnancy: Secondary | ICD-10-CM

## 2019-03-29 ENCOUNTER — Other Ambulatory Visit (INDEPENDENT_AMBULATORY_CARE_PROVIDER_SITE_OTHER): Payer: Self-pay | Admitting: Obstetrics and Gynecology

## 2019-04-01 ENCOUNTER — Encounter (HOSPITAL_COMMUNITY): Payer: Self-pay | Admitting: Obstetrics and Gynecology

## 2019-04-01 ENCOUNTER — Other Ambulatory Visit: Payer: Self-pay

## 2019-04-01 ENCOUNTER — Inpatient Hospital Stay (HOSPITAL_COMMUNITY)
Admission: AD | Admit: 2019-04-01 | Discharge: 2019-04-01 | Disposition: A | Discharge status: Home / Self Care | Payer: Medicaid Other | Attending: Obstetrics and Gynecology | Admitting: Obstetrics and Gynecology

## 2019-04-01 DIAGNOSIS — O10013 Pre-existing essential hypertension complicating pregnancy, third trimester: Secondary | ICD-10-CM | POA: Diagnosis not present

## 2019-04-01 DIAGNOSIS — O10913 Unspecified pre-existing hypertension complicating pregnancy, third trimester: Secondary | ICD-10-CM | POA: Diagnosis not present

## 2019-04-01 DIAGNOSIS — D573 Sickle-cell trait: Secondary | ICD-10-CM | POA: Diagnosis not present

## 2019-04-01 DIAGNOSIS — O4693 Antepartum hemorrhage, unspecified, third trimester: Secondary | ICD-10-CM | POA: Insufficient documentation

## 2019-04-01 DIAGNOSIS — O10919 Unspecified pre-existing hypertension complicating pregnancy, unspecified trimester: Secondary | ICD-10-CM

## 2019-04-01 DIAGNOSIS — Z87891 Personal history of nicotine dependence: Secondary | ICD-10-CM | POA: Insufficient documentation

## 2019-04-01 DIAGNOSIS — O99013 Anemia complicating pregnancy, third trimester: Secondary | ICD-10-CM | POA: Diagnosis not present

## 2019-04-01 DIAGNOSIS — Z3A33 33 weeks gestation of pregnancy: Secondary | ICD-10-CM | POA: Diagnosis not present

## 2019-04-01 LAB — WET PREP, GENITAL
Clue Cells Wet Prep HPF POC: NONE SEEN
Sperm: NONE SEEN
Trich, Wet Prep: NONE SEEN
Yeast Wet Prep HPF POC: NONE SEEN

## 2019-04-01 LAB — URINALYSIS, ROUTINE W REFLEX MICROSCOPIC
Bacteria, UA: NONE SEEN
Bilirubin Urine: NEGATIVE
Glucose, UA: NEGATIVE mg/dL
Hgb urine dipstick: NEGATIVE
Ketones, ur: NEGATIVE mg/dL
Nitrite: NEGATIVE
Protein, ur: NEGATIVE mg/dL
Specific Gravity, Urine: 1.004 — ABNORMAL LOW (ref 1.005–1.030)
pH: 6 (ref 5.0–8.0)

## 2019-04-01 NOTE — MAU Provider Note (Addendum)
Chief Complaint:  Vaginal Bleeding (when wiping ) and Pelvic Pain (pressure )   First Provider Initiated Contact with Patient 04/01/19 1522      HPI: Natalie Chase is a 39 y.o. G3P1011 at 46w5dwho presents to maternity admissions reporting blood with wiping after urination earlier today; also reports some pelvic pressure. Has noted scant blood with last 3x she has urinated today. Only spotting with wiping, no blood in underwear. Reports compliance with medications. Had intercourse yesterday. Denies concerns for STD's; denies hx of STD's. Denies other complaints.  She reports good fetal movement, denies LOF, vaginal bleeding, vaginal itching/burning, urinary symptoms, h/a, dizziness, n/v, or fever/chills.    Past Medical History: Past Medical History:  Diagnosis Date  . Asthma   . Hypertension   . [place]upus [place]matosus) [place]   . S[place]le cell trait ([place]   . Vaginal Pap smear, abnormal     Past obstetric history: OB History  Gravida Para Term Preterm AB Living  3 1 1   1 1   SAB TAB Ectopic Multiple Live Births    1     1    # Outcome Date GA Lbr Len/2nd Weight Sex Delivery Anes PTL Lv  3 Current           2 Term 11/30/02 [id number] g M Vag-Spont   LIV  1 TAB             Past Surgical History: Past Surgical History:  Procedure Laterality Date  . CHOLECYSTECTOMY      Family History: Family History  Problem Relation Age of Onset  . Achondroplasia Mother   . Lupus Mother   . Sickle cell anemia Mother   . Hypertension Father   . Stroke Father   . Cancer Other     Social History: Social History   Tobacco Use  . Smoking status: Former Smoker    Packs/day: 0.00    Types: Cigarettes    Quit date: 01/09/2017    Years since quitting: 2.2  . Smokeless tobacco: Former [employer] Topics  . Alcohol use: Not Currently  . Drug use: Not Currently    Types: Marijuana    Comment: Stopped August 5th, 2020    Allergies: No Known Allergies  Meds:   Medications Prior to Admission  Medication Sig Dispense Refill Last Dose  . Accu-Chek FastClix Lancets MISC 1 Device by Percutaneous route 4 (four) times daily. 100 each 3 03/31/2019 at Unknown time  . albuterol (PROVENTIL HFA;VENTOLIN HFA) 108 (90 Base) MCG/ACT inhaler Inhale 1-2 puffs into the lungs every 6 (six) hours as needed for wheezing or shortness of breath. 1 Inhaler 0 Past Week at Unknown time  . aspirin 81 MG chewable tablet Chew 1 tablet (81 mg total) by mouth daily. 30 tablet 6 04/01/2019 at Unknown time  . Blood Glucose Monitoring Suppl (ACCU-CHEK GUIDE ME) w/Device KIT 1 kit by Does not apply route QID. 1 kit 0 03/31/2019 at Unknown time  . Cholecalciferol (VITAMIN D3) 50 MCG (2000 UT) TABS Take 1 capsule by mouth daily.   04/01/2019 at Unknown time  . cyclobenzaprine (FLEXERIL) 10 MG tablet Take 1 tablet (10 mg total) by mouth every 8 (eight) hours as needed for muscle spasms. 30 tablet 1 Past Week at Unknown time  . Elastic Bandages & Supports (COMFORT FIT MATERNITY SUPP LG) MISC 1 Units by Does not apply route daily. 1 each 0 Past Week at Unknown time  . glucose blood (ACCU-CHEK  GUIDE) test strip Use as instructed 100 each 12 03/31/2019 at Unknown time  . hydroxychloroquine (PLAQUENIL) 200 MG tablet Take by mouth daily.   04/01/2019 at Unknown time  . Iron-Vitamin C 65-125 MG TABS Take 1 tablet by mouth every other day. 1 tablet on Monday, Wednesday, Friday   04/01/2019 at Unknown time  . NIFEdipine (ADALAT CC) 60 MG 24 hr tablet Take 1 tablet by mouth once daily 30 tablet 0 04/01/2019 at Unknown time  . Prenatal Vit-Fe Fumarate-FA (NIVA-PLUS) 27-1 MG TABS Take 1 tablet by mouth once daily 30 tablet 0 04/01/2019 at Unknown time    ROS:  Review of Systems All other systems negative unless noted above in HPI.   I have reviewed patient's Past Medical Hx, Surgical Hx, Family Hx, Social Hx, medications and allergies.   Physical Exam   Patient Vitals for the past 24 hrs:  BP Temp Pulse Resp  SpO2  04/01/19 1619 127/66 - (!) 116 - 99 %  04/01/19 1519 122/65 - (!) 115 - -  04/01/19 1508 (!) 112/56 98.4 F (36.9 C) (!) 116 12 98 %   Constitutional: Well-developed, well-nourished female in no acute distress.  Cardiovascular: mild tachycardia Respiratory: normal effort GI: Abd soft, non-tender, gravid appropriate for gestational age.  MS: Extremities nontender, no edema, normal ROM Neurologic: Alert and oriented x 4.  GU: Neg CVAT. PELVIC EXAM: Cervix pink, visually closed, without lesion, scant white creamy discharge, vaginal walls and external genitalia normal. No blood at cervical os or in vaginal vault.  Bimanual exam: Cervix 0/long/high, firm, anterior, neg CMT, uterus nontender, nonenlarged, adnexa without tenderness, enlargement, or mass  Dilation: Closed Effacement (%): Thick Cervical Position: Posterior Presentation: Vertex Exam by:: Dr. Marice Potter  FHT:  Baseline 150, moderate variability, accelerations present, no decelerations Contractions: None   Labs: Results for orders placed or performed during the hospital encounter of 04/01/19 (from the past 24 hour(s))  Urinalysis, Routine w reflex microscopic     Status: Abnormal   Collection Time: 04/01/19  3:22 PM  Result Value Ref Range   Color, Urine YELLOW YELLOW   APPearance HAZY (A) CLEAR   Specific Gravity, Urine 1.004 (L) 1.005 - 1.030   pH 6.0 5.0 - 8.0   Glucose, UA NEGATIVE NEGATIVE mg/dL   Hgb urine dipstick NEGATIVE NEGATIVE   Bilirubin Urine NEGATIVE NEGATIVE   Ketones, ur NEGATIVE NEGATIVE mg/dL   Protein, ur NEGATIVE NEGATIVE mg/dL   Nitrite NEGATIVE NEGATIVE   Leukocytes,Ua TRACE (A) NEGATIVE   RBC / HPF 0-5 0 - 5 RBC/hpf   WBC, UA 0-5 0 - 5 WBC/hpf   Bacteria, UA NONE SEEN NONE SEEN   Squamous Epithelial / LPF 0-5 0 - 5  Wet prep, genital     Status: Abnormal   Collection Time: 04/01/19  3:36 PM   Specimen: PATH Cytology Cervicovaginal Ancillary Only; Genital  Result Value Ref Range   Yeast Wet  Prep HPF POC NONE SEEN NONE SEEN   Trich, Wet Prep NONE SEEN NONE SEEN   Clue Cells Wet Prep HPF POC NONE SEEN NONE SEEN   WBC, Wet Prep HPF POC FEW (A) NONE SEEN   Sperm NONE SEEN    O/Positive/-- (09/17 0902)  Imaging:  [place] MFM FETAL BPP WO NON STRESS  Result Date: 03/28/2019 ----------------------------------------------------------------------  OBSTETRICS REPORT                       (Signed Final 03/28/2019 04:20 pm) ---------------------------------------------------------------------- Patient Info  ID #:       [id number]                          D.O.B.:  [birth year] (38 yrs)  Name:       [name]                   Visit Date: 03/28/2019 03:38 pm ---------------------------------------------------------------------- Performed By  Performed By:     Novella Rob        Ref. Address:     [address]                                                             [place], [place]  Attending:        Johnell Comings MD         [facility]  Referred By:      Laury Deep CNM ---------------------------------------------------------------------- Orders   #  Description                          Code         Ordered By   1  [place] MFM FETAL BPP WO NON              20947.09     RAVI [facility]      STRESS  ----------------------------------------------------------------------   #  Order #                    Accession #                 Episode #   1  [id number]                  [id number]                  [id number]  ---------------------------------------------------------------------- Indications  Advanced maternal age multigravida 17+,        O66.523   third trimester   Hypertension - Chronic/Pre-existing            O10.019   (Procardia XL 60 mg qd,  bASA)   Systemic lupus complicating pregnancy,         O26.892, M32.9   second trimester   Asthma (Albuterol)                             O99.89 j45.909   Genetic carrier (Sickle Cell Carrier)          K59.9   Obesity complicating pregnancy, third          O99.213   trimester   Marijuana Use (stopped 10/02/18)   [redacted] weeks gestation of pregnancy                Z3A.33  ---------------------------------------------------------------------- Vital Signs  Weight (lb): 207                               Height:        5'4"  BMI:         35.53 ---------------------------------------------------------------------- Fetal Evaluation  Num Of Fetuses:         1  Fetal Heart Rate(bpm):  138  Cardiac Activity:       Observed  Presentation:           Cephalic  Placenta:               Left lateral  Amniotic Fluid  AFI FV:      Within normal limits  AFI Sum(cm)     %Tile       Largest Pocket(cm)  13.06           41          4.13  RUQ(cm)       RLQ(cm)       LUQ(cm)        LLQ(cm)  4.13          2.97          1.93           4.03 ---------------------------------------------------------------------- Biophysical Evaluation  Amniotic F.V:   Within normal limits       F. Tone:        Observed  F. Movement:    Observed                   Score:          8/8  F. Breathing:   Observed ---------------------------------------------------------------------- OB History  Gravidity:    3         Term:   1  TOP:          1        Living:  1 ---------------------------------------------------------------------- Gestational Age  LMP:           32w 3d        Date:  08/13/18                 EDD:   05/20/19  Best:          33w 1d     Det. ByLoman Chroman         EDD:   05/15/19                                      (  10/28/18) ---------------------------------------------------------------------- Anatomy  Thoracic:              Appears normal         Bladder:                Appears normal  Stomach:               Appears normal, left                          sided ---------------------------------------------------------------------- Cervix Uterus Adnexa  Cervix  Not visualized (advanced GA >24wks) ---------------------------------------------------------------------- Comments  A biophysical profile performed today due to a history of  chronic hypertension currently treated with Procardia was 8  out of 8.  There was normal amniotic fluid noted on today's ultrasound  exam.  Another biophysical profile was scheduled in 1 week. ----------------------------------------------------------------------                   Johnell Comings, MD Electronically Signed Final Report   03/28/2019 04:20 pm ----------------------------------------------------------------------  [place] MFM FETAL BPP WO NON STRESS  Result Date: 03/21/2019 ----------------------------------------------------------------------  OBSTETRICS REPORT                       (Signed Final 03/21/2019 04:44 pm) ---------------------------------------------------------------------- Patient Info  ID #:       [id number]                          D.O.B.:  [birth year] (38 yrs)  Name:       [name]                   Visit Date: 03/21/2019 04:02 pm ---------------------------------------------------------------------- Performed By  Performed By:     Rodrigo Ran BS      Ref. Address:     [address]                    [place]                                                             [place], [place]  Attending:        Tama High MD        [facility]  Referred By:      Laury Deep CNM ---------------------------------------------------------------------- Orders   #  Description                          Code         Ordered By   1  [place] MFM FETAL BPP WO NON               76819.01     RAVI [facility]      STRESS   2  [place] MFM OB FOLLOW UP                  76816.01     RAVI [facility]  ----------------------------------------------------------------------   #  Order #                    Accession #                 Episode #   1  [id number]                  [id number]                  [id number]   2  [id number]                  [id number]                  [id number]  ---------------------------------------------------------------------- Indications   Encounter for other antenatal screening        Z36.2   follow-up   Advanced maternal age multigravida 22+,        O42.523   third trimester   Hypertension - Chronic/Pre-existing            O10.019   (Procardia XL 60 mg qd, bASA)   Systemic lupus complicating pregnancy,         O26.892, M32.9   second trimester   Asthma (Albuterol)                             O99.89 j45.909   Genetic carrier (Sickle Cell Carrier)          R83.0   Obesity complicating pregnancy, third          O99.213   trimester   Marijuana Use (stopped 10/02/18)   [redacted] weeks gestation of pregnancy                Z3A.32  ---------------------------------------------------------------------- Fetal Evaluation  Num Of Fetuses:         1  Fetal Heart Rate(bpm):  151  Cardiac Activity:       Observed  Presentation:           Cephalic  Placenta:               Anterior  P. Cord Insertion:      Visualized  Amniotic Fluid  AFI FV:      Within normal limits  AFI Sum(cm)     %Tile       Largest Pocket(cm)  18.64           69          7.25  RUQ(cm)       RLQ(cm)       LUQ(cm)  LLQ(cm)  7.25          4.01          4.31           3.07 ---------------------------------------------------------------------- Biophysical Evaluation  Amniotic F.V:   Within normal limits       F. Tone:        Observed  F. Movement:    Observed                   Score:          8/8  F. Breathing:   Observed ---------------------------------------------------------------------- Biometry  BPD:      82.9  mm     G. Age:  33w  2d         77  %    CI:        73.43   %    70 - 86                                                          FL/HC:      19.3   %    19.1 - 21.3  HC:      307.4  mm     G. Age:  34w 2d         71  %    HC/AC:      1.05        0.96 - 1.17  AC:      291.7  mm     G. Age:  33w 1d         78  %    FL/BPD:     71.4   %    71 - 87  FL:       59.2  mm     G. Age:  30w 6d         10  %    FL/AC:      20.3   %    20 - 24  HUM:      53.6  mm     G. Age:  31w 1d         33  %  Est. FW:    2014  gm      4 lb 7 oz     55  % ---------------------------------------------------------------------- OB History  Gravidity:    3         Term:   1  TOP:          1        Living:  1 ---------------------------------------------------------------------- Gestational Age  LMP:           31w 3d        Date:  08/13/18                 EDD:   05/20/19  U/S Today:     32w 6d                                        EDD:   05/10/19  Best:          32w 1d     Det. By:  Loman Chroman  EDD:   05/15/19                                      (10/28/18) ---------------------------------------------------------------------- Anatomy  Cranium:               Appears normal         Aortic Arch:            Appears normal  Cavum:                 Appears normal         Ductal Arch:            Previously seen  Ventricles:            Appears normal         Diaphragm:              Previously seen  Choroid Plexus:        Appears normal         Stomach:                Appears normal, left                                                                        sided  Cerebellum:            Appears normal         Abdomen:                Previously seen  Posterior Fossa:       Previously seen        Abdominal Wall:         Previously seen  Nuchal Fold:           Previously seen        Cord Vessels:           Previously seen  Face:                  Appears normal         Kidneys:                Appear normal                         (orbits and profile)  Lips:                   Appears normal         Bladder:                Appears normal  Thoracic:              Appears normal         Spine:                  Previously seen  Heart:                 Appears normal         Upper Extremities:      Previously seen                         (  4CH, axis, and                         situs)  RVOT:                  Previously seen        Lower Extremities:      Previously seen  LVOT:                  Appears normal  Other:  Heels previously visualized. Nasal bone visualized. ---------------------------------------------------------------------- Cervix Uterus Adnexa  Cervix  Not visualized (advanced GA >24wks)  Uterus  No abnormality visualized.  Left Ovary  Within normal limits.  Right Ovary  Not visualized.  Cul De Sac  No free fluid seen.  Adnexa  No abnormality visualized. ---------------------------------------------------------------------- Impression  Chronic hypertension. Patient takes nifedipine for control.  Amniotic fluid is normal and good fetal activity is seen. Fetal  growth is appropriate for gestational age. Antenatal testing is  reassuring. BPP 8/8.  We reassured the patient of the findings.  BP at our office: 129/66 mm Hg. ----------------------------------------------------------------------                  Tama High, MD Electronically Signed Final Report   03/21/2019 04:44 pm ----------------------------------------------------------------------  [place] MFM OB FOLLOW UP  Result Date: 03/21/2019 ----------------------------------------------------------------------  OBSTETRICS REPORT                       (Signed Final 03/21/2019 04:44 pm) ---------------------------------------------------------------------- Patient Info  ID #:       [id number]                          D.O.B.:  1981-02-26 (38 yrs)  Name:       [name]                   Visit Date: 03/21/2019 04:02 pm ---------------------------------------------------------------------- Performed By  Performed By:      Rodrigo Ran BS      Ref. Address:     [address]                    [place]                                                             [place], [place]  Attending:        Tama High MD        [facility]  Referred By:      Laury Deep CNM ---------------------------------------------------------------------- Orders   #  Description                          Code         Ordered By   1  [place] MFM FETAL BPP WO NON              76819.01     RAVI [facility]      STRESS   2  [place] MFM OB FOLLOW UP                  76816.01     RAVI [facility]  ----------------------------------------------------------------------   #  Order #                    Accession #                 Episode #   1  [id number]                  [id number]                  [id number]   2  [id number]                  [id number]                  [id number]  ---------------------------------------------------------------------- Indications   Encounter for other antenatal screening        Z36.2   follow-up   Advanced maternal age multigravida 62+,        O30.523   third trimester   Hypertension - Chronic/Pre-existing            O10.019   (Procardia XL 60 mg qd, bASA)   Systemic lupus complicating pregnancy,         O26.892, M32.9   second trimester   Asthma (Albuterol)                             O99.89 j45.909   Genetic carrier (Sickle Cell Carrier)          G66.5   Obesity complicating pregnancy, third          O99.213   trimester   Marijuana Use (stopped 10/02/18)   [redacted] weeks gestation of pregnancy                Z3A.32  ---------------------------------------------------------------------- Fetal Evaluation  Num Of Fetuses:         1  Fetal Heart Rate(bpm):  151  Cardiac Activity:       Observed  Presentation:            Cephalic  Placenta:               Anterior  P. Cord Insertion:      Visualized  Amniotic Fluid  AFI FV:  Within normal limits  AFI Sum(cm)     %Tile       Largest Pocket(cm)  18.64           69          7.25  RUQ(cm)       RLQ(cm)       LUQ(cm)        LLQ(cm)  7.25          4.01          4.31           3.07 ---------------------------------------------------------------------- Biophysical Evaluation  Amniotic F.V:   Within normal limits       F. Tone:        Observed  F. Movement:    Observed                   Score:          8/8  F. Breathing:   Observed ---------------------------------------------------------------------- Biometry  BPD:      82.9  mm     G. Age:  33w 2d         77  %    CI:        73.43   %    70 - 86                                                          FL/HC:      19.3   %    19.1 - 21.3  HC:      307.4  mm     G. Age:  34w 2d         71  %    HC/AC:      1.05        0.96 - 1.17  AC:      291.7  mm     G. Age:  33w 1d         78  %    FL/BPD:     71.4   %    71 - 87  FL:       59.2  mm     G. Age:  30w 6d         10  %    FL/AC:      20.3   %    20 - 24  HUM:      53.6  mm     G. Age:  31w 1d         33  %  Est. FW:    2014  gm      4 lb 7 oz     55  % ---------------------------------------------------------------------- OB History  Gravidity:    3         Term:   1  TOP:          1        Living:  1 ---------------------------------------------------------------------- Gestational Age  LMP:           31w 3d        Date:  08/13/18                 EDD:   05/20/19  U/S Today:     32w 6d  EDD:   05/10/19  Best:          32w 1d     Det. ByLoman Chroman         EDD:   05/15/19                                      (10/28/18) ---------------------------------------------------------------------- Anatomy  Cranium:               Appears normal         Aortic Arch:            Appears normal  Cavum:                 Appears normal         Ductal  Arch:            Previously seen  Ventricles:            Appears normal         Diaphragm:              Previously seen  Choroid Plexus:        Appears normal         Stomach:                Appears normal, left                                                                        sided  Cerebellum:            Appears normal         Abdomen:                Previously seen  Posterior Fossa:       Previously seen        Abdominal Wall:         Previously seen  Nuchal Fold:           Previously seen        Cord Vessels:           Previously seen  Face:                  Appears normal         Kidneys:                Appear normal                         (orbits and profile)  Lips:                  Appears normal         Bladder:                Appears normal  Thoracic:              Appears normal         Spine:                  Previously seen  Heart:  Appears normal         Upper Extremities:      Previously seen                         (4CH, axis, and                         situs)  RVOT:                  Previously seen        Lower Extremities:      Previously seen  LVOT:                  Appears normal  Other:  Heels previously visualized. Nasal bone visualized. ---------------------------------------------------------------------- Cervix Uterus Adnexa  Cervix  Not visualized (advanced GA >24wks)  Uterus  No abnormality visualized.  Left Ovary  Within normal limits.  Right Ovary  Not visualized.  Cul De Sac  No free fluid seen.  Adnexa  No abnormality visualized. ---------------------------------------------------------------------- Impression  Chronic hypertension. Patient takes nifedipine for control.  Amniotic fluid is normal and good fetal activity is seen. Fetal  growth is appropriate for gestational age. Antenatal testing is  reassuring. BPP 8/8.  We reassured the patient of the findings.  BP at our office: 129/66 mm Hg. ----------------------------------------------------------------------                   Tama High, MD Electronically Signed Final Report   03/21/2019 04:44 pm ----------------------------------------------------------------------   MAU Course/MDM: Orders Placed This Encounter  Procedures  . Wet prep, genital  . Urinalysis, Routine w reflex microscopic  . Discharge patient    No orders of the defined types were placed in this encounter.    NST reviewed: Reactive (some fetal tachycardia after patient had juice which resolved) Toco: none Presented for vaginal bleeding in 3rd trimester. Reviewed most recent US; no placental abnormalities.  GC/Chlamydia drawn and pending. UA and Wet prep without negative. No bleeding on speculum exam.  Likely due to recent intercourse. Of note patient with cHTN on Procardia. BP's well-controlled during MAU stay. Pt discharged with strict return precautions.  Assessment: 1. Vaginal bleeding in pregnancy, third trimester   2. Chronic hypertension affecting pregnancy     Plan: Discharge home Labor precautions and fetal kick counts Follow-up [place] Follow up.   Specialty: Obstetrics and Gynecology Contact information: [address], [place] [phone]         Allergies as of 04/01/2019   No Known Allergies     Medication List    TAKE these medications   Accu-Chek FastClix Lancets Misc 1 Device by Percutaneous route 4 (four) times daily.   Accu-Chek Guide Me w/Device Kit 1 kit by Does not apply route QID.   Accu-Chek Guide test strip Generic drug: glucose blood Use as instructed   albuterol 108 (90 Base) MCG/ACT inhaler Commonly known as: VENTOLIN HFA Inhale 1-2 puffs into the lungs every 6 (six) hours as needed for wheezing or shortness of breath.   aspirin 81 MG chewable tablet Chew 1 tablet (81 mg total) by mouth daily.   Comfort Fit Maternity Supp Lg Misc 1 Units by Does not apply route daily.    cyclobenzaprine 10 MG tablet Commonly known as: FLEXERIL Take 1 tablet (10 mg total) by mouth every 8 (eight) hours as needed  for muscle spasms.   hydroxychloroquine 200 MG tablet Commonly known as: PLAQUENIL Take by mouth daily.   Iron-Vitamin C 65-125 MG Tabs Take 1 tablet by mouth every other day. 1 tablet on Monday, Wednesday, Friday   NIFEdipine 60 MG 24 hr tablet Commonly known as: ADALAT CC Take 1 tablet by mouth once daily   Niva-Plus 27-1 MG Tabs Take 1 tablet by mouth once daily   Vitamin D3 50 MCG (2000 UT) Tabs Take 1 capsule by mouth daily.       Barrington Ellison, MD [facility] Family [facility] Fellow, [facility] for [facility] 04/01/2019 4:28 PM

## 2019-04-01 NOTE — MAU Note (Addendum)
Pt states she has urinated three times today and each time when wiping she saw a little bit of blood.   Pt also reports feeling pelvic pressure   Denies LOF.   Reports +FM

## 2019-04-02 LAB — GC/CHLAMYDIA PROBE AMP (~~LOC~~) NOT AT ARMC
Chlamydia: NEGATIVE
Comment: NEGATIVE
Comment: NORMAL
Neisseria Gonorrhea: NEGATIVE

## 2019-04-04 ENCOUNTER — Ambulatory Visit (HOSPITAL_COMMUNITY): Payer: Medicaid Other | Admitting: *Deleted

## 2019-04-04 ENCOUNTER — Ambulatory Visit (HOSPITAL_COMMUNITY)
Admission: RE | Admit: 2019-04-04 | Discharge: 2019-04-04 | Disposition: A | Discharge status: Home / Self Care | Payer: Medicaid Other | Source: Ambulatory Visit | Attending: Obstetrics and Gynecology | Admitting: Obstetrics and Gynecology

## 2019-04-04 ENCOUNTER — Other Ambulatory Visit: Payer: Self-pay

## 2019-04-04 ENCOUNTER — Encounter (HOSPITAL_COMMUNITY): Payer: Self-pay

## 2019-04-04 DIAGNOSIS — O26893 Other specified pregnancy related conditions, third trimester: Secondary | ICD-10-CM | POA: Diagnosis not present

## 2019-04-04 DIAGNOSIS — M329 Systemic lupus erythematosus, unspecified: Secondary | ICD-10-CM | POA: Diagnosis not present

## 2019-04-04 DIAGNOSIS — O09523 Supervision of elderly multigravida, third trimester: Secondary | ICD-10-CM | POA: Diagnosis not present

## 2019-04-04 DIAGNOSIS — O10919 Unspecified pre-existing hypertension complicating pregnancy, unspecified trimester: Secondary | ICD-10-CM | POA: Diagnosis present

## 2019-04-04 DIAGNOSIS — O10013 Pre-existing essential hypertension complicating pregnancy, third trimester: Secondary | ICD-10-CM

## 2019-04-04 DIAGNOSIS — D573 Sickle-cell trait: Secondary | ICD-10-CM | POA: Diagnosis present

## 2019-04-04 DIAGNOSIS — Z3A34 34 weeks gestation of pregnancy: Secondary | ICD-10-CM

## 2019-04-04 DIAGNOSIS — O99019 Anemia complicating pregnancy, unspecified trimester: Secondary | ICD-10-CM

## 2019-04-04 DIAGNOSIS — O0990 Supervision of high risk pregnancy, unspecified, unspecified trimester: Secondary | ICD-10-CM | POA: Insufficient documentation

## 2019-04-07 ENCOUNTER — Other Ambulatory Visit (HOSPITAL_COMMUNITY): Payer: Self-pay | Admitting: *Deleted

## 2019-04-07 DIAGNOSIS — O10913 Unspecified pre-existing hypertension complicating pregnancy, third trimester: Secondary | ICD-10-CM

## 2019-04-11 ENCOUNTER — Ambulatory Visit (HOSPITAL_COMMUNITY): Payer: Medicaid Other | Admitting: *Deleted

## 2019-04-11 ENCOUNTER — Other Ambulatory Visit (HOSPITAL_COMMUNITY): Payer: Self-pay | Admitting: *Deleted

## 2019-04-11 ENCOUNTER — Other Ambulatory Visit: Payer: Self-pay

## 2019-04-11 ENCOUNTER — Ambulatory Visit (HOSPITAL_COMMUNITY)
Admission: RE | Admit: 2019-04-11 | Discharge: 2019-04-11 | Disposition: A | Discharge status: Home / Self Care | Payer: Medicaid Other | Source: Ambulatory Visit | Attending: Obstetrics and Gynecology | Admitting: Obstetrics and Gynecology

## 2019-04-11 ENCOUNTER — Encounter (HOSPITAL_COMMUNITY): Payer: Self-pay

## 2019-04-11 DIAGNOSIS — O10919 Unspecified pre-existing hypertension complicating pregnancy, unspecified trimester: Secondary | ICD-10-CM | POA: Diagnosis present

## 2019-04-11 DIAGNOSIS — O99019 Anemia complicating pregnancy, unspecified trimester: Secondary | ICD-10-CM | POA: Insufficient documentation

## 2019-04-11 DIAGNOSIS — Z3A35 35 weeks gestation of pregnancy: Secondary | ICD-10-CM

## 2019-04-11 DIAGNOSIS — O10913 Unspecified pre-existing hypertension complicating pregnancy, third trimester: Secondary | ICD-10-CM

## 2019-04-11 DIAGNOSIS — D573 Sickle-cell trait: Secondary | ICD-10-CM | POA: Insufficient documentation

## 2019-04-11 DIAGNOSIS — O0990 Supervision of high risk pregnancy, unspecified, unspecified trimester: Secondary | ICD-10-CM | POA: Insufficient documentation

## 2019-04-11 DIAGNOSIS — O10013 Pre-existing essential hypertension complicating pregnancy, third trimester: Secondary | ICD-10-CM

## 2019-04-11 DIAGNOSIS — O09523 Supervision of elderly multigravida, third trimester: Secondary | ICD-10-CM

## 2019-04-11 DIAGNOSIS — O09529 Supervision of elderly multigravida, unspecified trimester: Secondary | ICD-10-CM

## 2019-04-11 NOTE — Progress Notes (Signed)
Pt reports Monday, Feb 8th- 11th she has experienced clear fluid in her panties requiring her to change her clothing.  Only one time each day.  None within the last 24 hours.  Good fetal movement. Also, has bilateral flank pain which started Monday as well. No vaginal bleeding.

## 2019-04-16 ENCOUNTER — Other Ambulatory Visit: Payer: Self-pay

## 2019-04-16 ENCOUNTER — Ambulatory Visit (INDEPENDENT_AMBULATORY_CARE_PROVIDER_SITE_OTHER): Payer: Medicaid Other | Admitting: Obstetrics & Gynecology

## 2019-04-16 VITALS — BP 113/72 | HR 99 | Wt 211.0 lb

## 2019-04-16 DIAGNOSIS — M329 Systemic lupus erythematosus, unspecified: Secondary | ICD-10-CM

## 2019-04-16 DIAGNOSIS — Z3A35 35 weeks gestation of pregnancy: Secondary | ICD-10-CM

## 2019-04-16 DIAGNOSIS — O10913 Unspecified pre-existing hypertension complicating pregnancy, third trimester: Secondary | ICD-10-CM

## 2019-04-16 DIAGNOSIS — O0993 Supervision of high risk pregnancy, unspecified, third trimester: Secondary | ICD-10-CM

## 2019-04-16 DIAGNOSIS — O10919 Unspecified pre-existing hypertension complicating pregnancy, unspecified trimester: Secondary | ICD-10-CM

## 2019-04-16 DIAGNOSIS — O0990 Supervision of high risk pregnancy, unspecified, unspecified trimester: Secondary | ICD-10-CM

## 2019-04-16 LAB — POCT URINALYSIS DIPSTICK
Bilirubin, UA: NEGATIVE
Blood, UA: NEGATIVE
Glucose, UA: NEGATIVE
Ketones, UA: NEGATIVE
Leukocytes, UA: NEGATIVE
Nitrite, UA: NEGATIVE
Protein, UA: NEGATIVE
Spec Grav, UA: 1.015 (ref 1.010–1.025)
Urobilinogen, UA: 0.2 E.U./dL
pH, UA: 6 (ref 5.0–8.0)

## 2019-04-16 NOTE — Progress Notes (Signed)
   PRENATAL VISIT NOTE  Subjective:  Natalie Chase is a 39 y.o. G3P1011 at [redacted]w[redacted]d being seen today for ongoing prenatal care.  She is currently monitored for the following issues for this high-risk pregnancy and has Hypertension; Candida rash of groin; Supervision of high risk pregnancy, antepartum; Chronic hypertension affecting pregnancy; Sickle cell trait in mother affecting pregnancy (HCC); and Lupus (systemic lupus erythematosus) (HCC) on their problem list.  Patient reports occasional contractions.  Contractions: Irregular. Vag. Bleeding: None.  Movement: Present. Denies leaking of fluid.   The following portions of the patient's history were reviewed and updated as appropriate: allergies, current medications, past family history, past medical history, past social history, past surgical history and problem list.   Objective:   Vitals:   04/16/19 0958  BP: 113/72  Pulse: 99  Weight: 211 lb (95.7 kg)    Fetal Status: Fetal Heart Rate (bpm): 140 Fundal Height: 36 cm Movement: Present  Presentation: Vertex  General:  Alert, oriented and cooperative. Patient is in no acute distress.  Skin: Skin is warm and dry. No rash noted.   Cardiovascular: Normal heart rate noted  Respiratory: Normal respiratory effort, no problems with respiration noted  Abdomen: Soft, gravid, appropriate for gestational age.  Pain/Pressure: Present     Pelvic: Cervical exam performed Dilation: Fingertip Effacement (%): 50 Station: -3  Extremities: Normal range of motion.     Mental Status: Normal mood and affect. Normal behavior. Normal judgment and thought content.   Assessment and Plan:  Pregnancy: G3P1011 at [redacted]w[redacted]d 1. Supervision of high risk pregnancy, antepartum Routine 36 weeks test - Strep Gp B NAA - POCT urinalysis dipstick  2. Chronic hypertension affecting pregnancy Nl BP good control. Nl growth. Checks BG and 90% well in range. May decrease testing  3. Systemic lupus erythematosus, unspecified  SLE type, unspecified organ involvement status (HCC) On Plaquenil. Will need delivery by 39 weeks  Preterm labor symptoms and general obstetric precautions including but not limited to vaginal bleeding, contractions, leaking of fluid and fetal movement were reviewed in detail with the patient. Please refer to After Visit Summary for other counseling recommendations.   Return in about 1 week (around 04/23/2019) for virtual.  Future Appointments  Date Time Provider Department Center  04/18/2019  9:00 AM [facility] NURSE [facility]  04/18/2019  9:00 AM [facility] [place] 3 [facility]  04/23/2019  9:15 AM Constant, Peggy, MD [facility] None  04/25/2019  3:00 PM [facility] NURSE [facility]  04/25/2019  3:00 PM [facility] [place] 3 [facility]  05/02/2019  3:00 PM [facility] NURSE [facility]  05/02/2019  3:00 PM [facility] [place] 3 [facility]    Scheryl Darter, MD

## 2019-04-16 NOTE — Patient Instructions (Signed)

## 2019-04-16 NOTE — Progress Notes (Signed)
Pt states she had recent visit to hospital for vaginal wetness and side/kidney pain. Pt states wetness has since stopped but she is still having "kidney"pain. Pt will leave urine sample today for Udip.  Pt states her blood sugars have been normal, has not checked in past 2 days.

## 2019-04-18 ENCOUNTER — Encounter (HOSPITAL_COMMUNITY): Payer: Self-pay

## 2019-04-18 ENCOUNTER — Ambulatory Visit (HOSPITAL_COMMUNITY)
Admission: RE | Admit: 2019-04-18 | Discharge: 2019-04-18 | Disposition: A | Discharge status: Home / Self Care | Payer: Medicaid Other | Source: Ambulatory Visit | Attending: Obstetrics and Gynecology | Admitting: Obstetrics and Gynecology

## 2019-04-18 ENCOUNTER — Other Ambulatory Visit (HOSPITAL_COMMUNITY): Payer: Medicaid Other

## 2019-04-18 ENCOUNTER — Other Ambulatory Visit: Payer: Self-pay

## 2019-04-18 ENCOUNTER — Ambulatory Visit (HOSPITAL_COMMUNITY): Payer: Medicaid Other | Admitting: *Deleted

## 2019-04-18 DIAGNOSIS — O99019 Anemia complicating pregnancy, unspecified trimester: Secondary | ICD-10-CM | POA: Diagnosis present

## 2019-04-18 DIAGNOSIS — O10013 Pre-existing essential hypertension complicating pregnancy, third trimester: Secondary | ICD-10-CM

## 2019-04-18 DIAGNOSIS — O10913 Unspecified pre-existing hypertension complicating pregnancy, third trimester: Secondary | ICD-10-CM | POA: Diagnosis not present

## 2019-04-18 DIAGNOSIS — O10919 Unspecified pre-existing hypertension complicating pregnancy, unspecified trimester: Secondary | ICD-10-CM | POA: Diagnosis present

## 2019-04-18 DIAGNOSIS — Z3A36 36 weeks gestation of pregnancy: Secondary | ICD-10-CM

## 2019-04-18 DIAGNOSIS — O0990 Supervision of high risk pregnancy, unspecified, unspecified trimester: Secondary | ICD-10-CM | POA: Diagnosis present

## 2019-04-18 DIAGNOSIS — D573 Sickle-cell trait: Secondary | ICD-10-CM | POA: Diagnosis present

## 2019-04-18 DIAGNOSIS — O09523 Supervision of elderly multigravida, third trimester: Secondary | ICD-10-CM | POA: Diagnosis not present

## 2019-04-18 DIAGNOSIS — Z362 Encounter for other antenatal screening follow-up: Secondary | ICD-10-CM | POA: Diagnosis not present

## 2019-04-18 LAB — STREP GP B NAA: Strep Gp B NAA: NEGATIVE

## 2019-04-21 DIAGNOSIS — Z3493 Encounter for supervision of normal pregnancy, unspecified, third trimester: Secondary | ICD-10-CM

## 2019-04-23 ENCOUNTER — Telehealth (INDEPENDENT_AMBULATORY_CARE_PROVIDER_SITE_OTHER): Payer: Medicaid Other | Admitting: Obstetrics and Gynecology

## 2019-04-23 ENCOUNTER — Encounter: Payer: Self-pay | Admitting: Obstetrics and Gynecology

## 2019-04-23 DIAGNOSIS — Z3A36 36 weeks gestation of pregnancy: Secondary | ICD-10-CM

## 2019-04-23 DIAGNOSIS — O0990 Supervision of high risk pregnancy, unspecified, unspecified trimester: Secondary | ICD-10-CM

## 2019-04-23 DIAGNOSIS — O99019 Anemia complicating pregnancy, unspecified trimester: Secondary | ICD-10-CM

## 2019-04-23 DIAGNOSIS — O10919 Unspecified pre-existing hypertension complicating pregnancy, unspecified trimester: Secondary | ICD-10-CM

## 2019-04-23 DIAGNOSIS — O99113 Other diseases of the blood and blood-forming organs and certain disorders involving the immune mechanism complicating pregnancy, third trimester: Secondary | ICD-10-CM

## 2019-04-23 DIAGNOSIS — O99013 Anemia complicating pregnancy, third trimester: Secondary | ICD-10-CM

## 2019-04-23 DIAGNOSIS — O0993 Supervision of high risk pregnancy, unspecified, third trimester: Secondary | ICD-10-CM

## 2019-04-23 DIAGNOSIS — M329 Systemic lupus erythematosus, unspecified: Secondary | ICD-10-CM

## 2019-04-23 DIAGNOSIS — D573 Sickle-cell trait: Secondary | ICD-10-CM

## 2019-04-23 DIAGNOSIS — O10913 Unspecified pre-existing hypertension complicating pregnancy, third trimester: Secondary | ICD-10-CM

## 2019-04-23 NOTE — Progress Notes (Signed)
Pt states that she does not want any use of vacuum at delivery. Pt would also like delay in cutting cord.  Pt advised to make hospital staff aware once she is admitted.

## 2019-04-23 NOTE — Progress Notes (Signed)
TELEHEALTH OBSTETRICS PRENATAL VIRTUAL VIDEO VISIT ENCOUNTER NOTE  Provider location: [facility] [place]   I connected with Natalie Chase on 04/23/19 at  9:15 AM EST by MyChart Video Encounter at home and verified that I am speaking with the correct person using two identifiers.   I discussed the limitations, risks, security and privacy concerns of performing an evaluation and management service virtually and the availability of in person appointments. I also discussed with the patient that there may be a patient responsible charge related to this service. The patient expressed understanding and agreed to proceed. Subjective:  Natalie Chase is a 39 y.o. G3P1011 at [redacted]w[redacted]d being seen today for ongoing prenatal care.  She is currently monitored for the following issues for this high-risk pregnancy and has Hypertension; Candida rash of groin; Supervision of high risk pregnancy, antepartum; Chronic hypertension affecting pregnancy; Sickle cell trait in mother affecting pregnancy (HCC); and Lupus (systemic lupus erythematosus) (HCC) on their problem list.  Patient reports no complaints.  Contractions: Irregular. Vag. Bleeding: None.  Movement: Present. Denies any leaking of fluid.   The following portions of the patient's history were reviewed and updated as appropriate: allergies, current medications, past family history, past medical history, past social history, past surgical history and problem list.   Objective:  There were no vitals filed for this visit.  Fetal Status:     Movement: Present     General:  Alert, oriented and cooperative. Patient is in no acute distress.  Respiratory: Normal respiratory effort, no problems with respiration noted  Mental Status: Normal mood and affect. Normal behavior. Normal judgment and thought content.  Rest of physical exam deferred due to type of encounter  Imaging: [place] MFM FETAL BPP WO NON STRESS  Result Date:  04/18/2019 ----------------------------------------------------------------------  OBSTETRICS REPORT                       (Signed Final 04/18/2019 09:59 am) ---------------------------------------------------------------------- Patient Info  ID #:       [id number]                          D.O.B.:  04/23/1980 (39 yrs)  Name:       Natalie Chase                   Visit Date: 04/18/2019 09:33 am ---------------------------------------------------------------------- Performed By  Performed By:     Hurman Horn          Ref. Address:     [address]                                                             [place], [place]  [address]  Attending:        Ma Rings MD         [facility]  Referred By:      Raelyn Mora CNM ---------------------------------------------------------------------- Orders   #  Description                          Code         Ordered By   1  [place] MFM FETAL BPP WO NON              76819.01     YU FANG      STRESS   2  [place] MFM OB FOLLOW UP                  76816.01     YU FANG  ----------------------------------------------------------------------   #  Order #                    Accession #                 Episode #   1  [id number]                  [id number]   2  [id number]                  [id number]                  [id number]  ---------------------------------------------------------------------- Indications   Hypertension - Chronic/Pre-existing            O10.019   (Procardia XL 60 mg qd, bASA)   Advanced maternal age multigravida 51+,        O63.523   third trimester   [redacted] weeks gestation of pregnancy                Z3A.36   Systemic lupus complicating pregnancy,         O26.893, M32.9   third trimester    Asthma (Albuterol)                             O99.89 j45.909   Genetic carrier (Sickle Cell Carrier)          Z14.8   Obesity complicating pregnancy, third          O99.213   trimester   Marijuana Use (stopped 10/02/18)   Advanced maternal age multigravida 62+,        O16.523   third trimester  ---------------------------------------------------------------------- Vital Signs  Height:        5'4" ---------------------------------------------------------------------- Fetal Evaluation  Num Of Fetuses:         1  Fetal Heart Rate(bpm):  153  Cardiac Activity:       Observed  Presentation:           Cephalic  Placenta:               Anterior  P. Cord Insertion:      Visualized, central  Amniotic Fluid  AFI FV:      Within normal limits  AFI Sum(cm)     %Tile       Largest Pocket(cm)  12.39           40          4.21  RUQ(cm)       RLQ(cm)       LUQ(cm)        LLQ(cm)  4.21          1.67          3.19           3.32 ---------------------------------------------------------------------- Biophysical Evaluation  Amniotic F.V:   Within normal limits       F. Tone:        Observed  F. Movement:    Observed                   Score:          8/8  F. Breathing:   Observed ---------------------------------------------------------------------- Biometry  BPD:      91.2  mm     G. Age:  37w 0d         81  %    CI:        79.36   %    70 - 86                                                          FL/HC:      20.4   %    20.1 - 22.1  HC:      323.6  mm     G. Age:  36w 4d         30  %    HC/AC:      0.92        0.93 - 1.11  AC:      349.9  mm     G. Age:  38w 6d       > 99  %    FL/BPD:     72.4   %    71 - 87  FL:         66  mm     G. Age:  34w 0d          5  %    FL/AC:      18.9   %    20 - 24  HUM:      61.5  mm     G. Age:  35w 4d         54  %  Est. FW:    3176  gm           7 lb     82  % ---------------------------------------------------------------------- OB History  Gravidity:    3         Term:   1  TOP:          1        Living:  1 ---------------------------------------------------------------------- Gestational Age  LMP:           35w 3d        Date:  08/13/18                 EDD:   05/20/19  U/S Today:     36w 4d                                        EDD:   05/12/19  Best:          36w 1d     Det. ByMarcella Dubs         EDD:   05/15/19                                      (10/28/18) ---------------------------------------------------------------------- Anatomy  Cranium:               Appears normal         Aortic Arch:            Appears normal  Cavum:                 Appears normal         Ductal Arch:            Previously seen  Ventricles:            Appears normal         Diaphragm:              Appears normal  Choroid Plexus:        Previously seen        Stomach:                Appears normal, left                                                                        sided  Cerebellum:            Previously seen        Abdomen:                Appears normal  Posterior Fossa:       Previously seen        Abdominal Wall:         Previously seen  Nuchal Fold:           Previously seen        Cord Vessels:           Previously seen  Face:                  Orbits and profile     Kidneys:                Appear normal  previously seen  Lips:                  Previously seen        Bladder:                Appears normal  Thoracic:              Previously seen        Spine:                  Previously seen  Heart:                 Previously seen        Upper Extremities:      Previously seen  RVOT:                  Previously seen        Lower Extremities:      Previously seen  LVOT:                  Previously seen  Other:  Heels previously visualized.  Nasal bone visualized previously.          Technically difficult due to advanced GA and fetal position. ---------------------------------------------------------------------- Comments  This  patient was seen for a follow up growth scan due to a  history of chronic hypertension that is currently treated with  Procardia.  She denies any problems since her last exam.  She was informed that the fetal growth and amniotic fluid  level appears appropriate for her gestational age.  A biophysical profile performed today was 8 out of 8.  Another biophysical profile was scheduled in 1 week. ----------------------------------------------------------------------                   Ma Rings, MD Electronically Signed Final Report   04/18/2019 09:59 am ----------------------------------------------------------------------  [place] MFM FETAL BPP WO NON STRESS  Result Date: 04/11/2019 ----------------------------------------------------------------------  OBSTETRICS REPORT                       (Signed Final 04/11/2019 04:19 pm) ---------------------------------------------------------------------- Patient Info  ID #:       [id number]                          D.O.B.:  March 21, 1980 (38 yrs)  Name:       Natalie Chase                   Visit Date: 04/11/2019 03:26 pm ---------------------------------------------------------------------- Performed By  Performed By:     Ramond Craver Tester BS,       Ref. Address:     [address]                                                             Road  [place], [place]                                                             [address]  Attending:        Ma Rings MD         [facility]  Referred By:      Raelyn Mora CNM ---------------------------------------------------------------------- Orders   #  Description                          Code         Ordered By   1  [place] MFM FETAL BPP WO NON              76819.01     YU FANG      STRESS   ----------------------------------------------------------------------   #  Order #                    Accession #                 Episode #   1  [id number]                  [id number]                  [id number]  ---------------------------------------------------------------------- Indications   Hypertension - Chronic/Pre-existing            O10.019   (Procardia XL 60 mg qd, bASA)   [redacted] weeks gestation of pregnancy                Z3A.83   Advanced maternal age multigravida 46+,        O35.523   third trimester   Systemic lupus complicating pregnancy,         O26.893, M32.9   third trimester   Asthma (Albuterol)                             O99.89 j45.909   Genetic carrier (Sickle Cell Carrier)          Z14.8   Obesity complicating pregnancy, third          O99.213   trimester   Marijuana Use (stopped 10/02/18)   Advanced maternal age multigravida 72+,        O96.523   third trimester  ---------------------------------------------------------------------- Vital Signs                                                 Height:  5'4" ---------------------------------------------------------------------- Fetal Evaluation  Num Of Fetuses:         1  Fetal Heart Rate(bpm):  148  Cardiac Activity:       Observed  Presentation:           Cephalic  Placenta:               Left lateral  P. Cord Insertion:      Previously Visualized  Amniotic Fluid  AFI FV:      Within normal limits  AFI Sum(cm)     %Tile       Largest Pocket(cm)  14.41           52          4.61  RUQ(cm)       RLQ(cm)       LUQ(cm)        LLQ(cm)  4.47          3.46          1.87           4.61 ---------------------------------------------------------------------- Biophysical Evaluation  Amniotic F.V:   Pocket => 2 cm             F. Tone:        Observed  F. Movement:    Observed                   Score:          8/8  F. Breathing:   Observed ---------------------------------------------------------------------- Biometry  LV:        3.3  mm  ---------------------------------------------------------------------- OB History  Gravidity:    3         Term:   1  TOP:          1        Living:  1 ---------------------------------------------------------------------- Gestational Age  LMP:           34w 3d        Date:  08/13/18                 EDD:   05/20/19  Best:          35w 1d     Det. ByMarcella Dubs         EDD:   05/15/19                                      (10/28/18) ---------------------------------------------------------------------- Anatomy  Cranium:               Previously seen        Aortic Arch:            Previously seen  Cavum:                 Previously seen        Ductal Arch:            Previously seen  Ventricles:            Appears normal         Diaphragm:              Previously seen  Choroid Plexus:        Previously seen        Stomach:                Appears normal, left  sided  Cerebellum:            Previously seen        Abdomen:                Previously seen  Posterior Fossa:       Previously seen        Abdominal Wall:         Previously seen  Nuchal Fold:           Previously seen        Cord Vessels:           Previously seen  Face:                  Orbits and profile     Kidneys:                Appear normal                         previously seen  Lips:                  Previously seen        Bladder:                Appears normal  Thoracic:              Previously seen        Spine:                  Previously seen  Heart:                 Appears normal         Upper Extremities:      Previously seen                         (4CH, axis, and                         situs)  RVOT:                  Previously seen        Lower Extremities:      Previously seen  LVOT:                  Previously seen  Other:  Heels previously visualized.  Nasal bone visualized previously.          Technically difficult due to advanced GA and fetal position.  ---------------------------------------------------------------------- Cervix Uterus Adnexa  Cervix  Not visualized (advanced GA >24wks)  Uterus  No abnormality visualized.  Left Ovary  Within normal limits. No adnexal mass visualized.  Right Ovary  Within normal limits. No adnexal mass visualized.  Cul De Sac  No free fluid seen.  Adnexa  No abnormality visualized. ---------------------------------------------------------------------- Comments  This patient was seen for a biophysical profile due to a history  of chronic hypertension that is currently treated with  nifedipine.  She denies any problems since her last exam.  A biophysical profile performed today was 8 out of 8.  There was normal amniotic fluid noted on today's ultrasound  exam.  Another biophysical profile and growth ultrasound was  scheduled in 1 week. ----------------------------------------------------------------------                   Ma RingsVictor Fang, MD Electronically Signed Final Report   04/11/2019 04:19  pm ----------------------------------------------------------------------  [place] MFM FETAL BPP WO NON STRESS  Result Date: 04/04/2019 ----------------------------------------------------------------------  OBSTETRICS REPORT                       (Signed Final 04/04/2019 03:50 pm) ---------------------------------------------------------------------- Patient Info  ID #:       [id number]                          D.O.B.:  Jan 25, 1981 (38 yrs)  Name:       Natalie Chase                   Visit Date: 04/04/2019 03:05 pm ---------------------------------------------------------------------- Performed By  Performed By:     Natalie Darling BS,      Ref. Address:     [address]                                                             [place], [place]                                                             [address]  Attending:        Ma Rings MD         [facility]  Referred By:      Raelyn Mora CNM ---------------------------------------------------------------------- Orders   #  Description                          Code         Ordered By   1  [place] MFM FETAL BPP WO NON              30865.78     RAVI SHANKAR      STRESS  ----------------------------------------------------------------------   #  Order #                    Accession #  Episode #   1  [id number]                  [id number]                  [id number]  ---------------------------------------------------------------------- Indications   Hypertension - Chronic/Pre-existing            O10.019   (Procardia XL 60 mg qd, bASA)   Advanced maternal age multigravida 31+,        O64.523   third trimester   Systemic lupus complicating pregnancy,         O26.892, M32.9   second trimester   Asthma (Albuterol)                             O99.89 j45.909   [redacted] weeks gestation of pregnancy                Z3A.34   Genetic carrier (Sickle Cell Carrier)          Z14.8   Obesity complicating pregnancy, third          O99.213   trimester   Marijuana Use (stopped 10/02/18)  ---------------------------------------------------------------------- Vital Signs                                                 Height:        5'4" ---------------------------------------------------------------------- Fetal Evaluation  Num Of Fetuses:         1  Fetal Heart Rate(bpm):  142  Cardiac Activity:       Observed  Presentation:           Cephalic  Placenta:               Left lateral  P. Cord Insertion:      Previously Visualized  Amniotic Fluid  AFI FV:      Within normal limits  AFI Sum(cm)     %Tile       Largest Pocket(cm)  12.54           38          4.4  RUQ(cm)       RLQ(cm)       LUQ(cm)        LLQ(cm)  2.5           4.07          4.4            1.57  ---------------------------------------------------------------------- Biophysical Evaluation  Amniotic F.V:   Pocket => 2 cm             F. Tone:        Observed  F. Movement:    Observed                   Score:          8/8  F. Breathing:   Observed ---------------------------------------------------------------------- OB History  Gravidity:    3         Term:   1  TOP:          1        Living:  1 ---------------------------------------------------------------------- Gestational Age  LMP:           33w 3d  Date:  08/13/18                 EDD:   05/20/19  Best:          34w 1d     Det. By:  Marcella Dubs         EDD:   05/15/19                                      (10/28/18) ---------------------------------------------------------------------- Comments  This patient was seen for a biophysical profile due to a history  of chronic hypertension currently treated with Procardia and  due to a history of lupus.  She denies any problems since her  last exam.  A biophysical profile performed today was 8 out of 8.  There was normal amniotic fluid noted on today's ultrasound  exam.  Another biophysical profile was scheduled in 1 week. ----------------------------------------------------------------------                   Ma Rings, MD Electronically Signed Final Report   04/04/2019 03:50 pm ----------------------------------------------------------------------  [place] MFM FETAL BPP WO NON STRESS  Result Date: 03/28/2019 ----------------------------------------------------------------------  OBSTETRICS REPORT                       (Signed Final 03/28/2019 04:20 pm) ---------------------------------------------------------------------- Patient Info  ID #:       [id number]                          D.O.B.:  October 03, 1980 (38 yrs)  Name:       Natalie Chase                   Visit Date: 03/28/2019 03:38 pm ---------------------------------------------------------------------- Performed By  Performed By:     Lenise Arena        Ref. Address:     [address]                                                             [place], [place]                                                             [address]  Attending:        Ma Rings MD         [facility]  Referred By:      Raelyn Mora CNM ---------------------------------------------------------------------- Orders   #  Description                          Code         Ordered By   1  [place] MFM FETAL BPP WO NON              76819.01     RAVI [facility]      STRESS  ----------------------------------------------------------------------   #  Order #                    Accession #                 Episode #   1  [id number]                  [id number]                  [id number]  ---------------------------------------------------------------------- Indications   Advanced maternal age multigravida 31+,        O49.523   third trimester   Hypertension - Chronic/Pre-existing            O10.019   (Procardia XL 60 mg qd, bASA)   Systemic lupus complicating pregnancy,         O26.892, M32.9   second trimester   Asthma (Albuterol)                             O99.89 j45.909   Genetic carrier (Sickle Cell Carrier)          Z14.8   Obesity complicating pregnancy, third          O99.213   trimester   Marijuana Use (stopped 10/02/18)   [redacted] weeks gestation of pregnancy                Z3A.33  ---------------------------------------------------------------------- Vital Signs  Weight (lb): 207                               Height:        5'4"  BMI:         35.53 ---------------------------------------------------------------------- Fetal Evaluation  Num Of Fetuses:         1  Fetal Heart Rate(bpm):  138  Cardiac Activity:       Observed  Presentation:           Cephalic  Placenta:               Left  lateral  Amniotic Fluid  AFI FV:      Within normal limits  AFI Sum(cm)     %Tile       Largest Pocket(cm)  13.06           41          4.13  RUQ(cm)       RLQ(cm)       LUQ(cm)        LLQ(cm)  4.13          2.97          1.93           4.03 ---------------------------------------------------------------------- Biophysical Evaluation  Amniotic F.V:   Within normal limits       F. Tone:        Observed  F. Movement:    Observed                   Score:          8/8  F. Breathing:   Observed ---------------------------------------------------------------------- OB History  Gravidity:    3         Term:   1  TOP:          1        Living:  1 ---------------------------------------------------------------------- Gestational Age  LMP:           32w 3d        Date:  08/13/18                 EDD:   05/20/19  Best:          33w 1d     Det. ByMarcella Dubs         EDD:   05/15/19                                      (10/28/18) ---------------------------------------------------------------------- Anatomy  Thoracic:              Appears normal         Bladder:                Appears normal  Stomach:               Appears normal, left                         sided ---------------------------------------------------------------------- Cervix Uterus Adnexa  Cervix  Not visualized (advanced GA >24wks) ---------------------------------------------------------------------- Comments  A biophysical profile performed today due to a history of  chronic hypertension currently treated with Procardia was 8  out of 8.  There was normal amniotic fluid noted on today's ultrasound  exam.  Another biophysical profile was scheduled in 1 week. ----------------------------------------------------------------------                   Ma Rings, MD Electronically Signed Final Report   03/28/2019 04:20 pm ----------------------------------------------------------------------  [place] MFM OB FOLLOW UP  Result Date:  04/18/2019 ----------------------------------------------------------------------  OBSTETRICS REPORT                       (Signed Final 04/18/2019 09:59 am) ---------------------------------------------------------------------- Patient Info  ID #:       [id number]                          D.O.B.:  [birth year] (38 yrs)  Name:       Natalie Chase                   Visit Date: 04/18/2019 09:33 am ---------------------------------------------------------------------- Performed By  Performed By:     Hurman Horn          Ref. Address:     [address]                    [place]  [address]                                                             [place], [place]                                                             [address]  Attending:        Ma Rings MD         [facility]  Referred By:      Raelyn Mora CNM ---------------------------------------------------------------------- Orders   #  Description                          Code         Ordered By   1  [place] MFM FETAL BPP WO NON              76819.01     YU FANG      STRESS   2  [place] MFM OB FOLLOW UP                  76816.01     YU FANG  ----------------------------------------------------------------------   #  Order #                    Accession #                 Episode #   1  [id number]                  [id number]                  [id number]   2  [id number]                  [id number]                  [id number]  ---------------------------------------------------------------------- Indications   Hypertension - Chronic/Pre-existing            O10.019   (Procardia XL 60 mg qd, bASA)   Advanced maternal age multigravida 66+,        O71.523   third trimester   [redacted] weeks gestation of pregnancy                Z3A.36   Systemic lupus complicating pregnancy,         O26.893, M32.9   third trimester    Asthma (Albuterol)  O99.89 j45.909   Genetic carrier (Sickle Cell Carrier)          Z14.8   Obesity complicating pregnancy, third          O99.213   trimester   Marijuana Use (stopped 10/02/18)   Advanced maternal age multigravida 82+,        O6.523   third trimester  ---------------------------------------------------------------------- Vital Signs                                                 Height:        5'4" ---------------------------------------------------------------------- Fetal Evaluation  Num Of Fetuses:         1  Fetal Heart Rate(bpm):  153  Cardiac Activity:       Observed  Presentation:           Cephalic  Placenta:               Anterior  P. Cord Insertion:      Visualized, central  Amniotic Fluid  AFI FV:      Within normal limits  AFI Sum(cm)     %Tile       Largest Pocket(cm)  12.39           40          4.21  RUQ(cm)       RLQ(cm)       LUQ(cm)        LLQ(cm)  4.21          1.67          3.19           3.32 ---------------------------------------------------------------------- Biophysical Evaluation  Amniotic F.V:   Within normal limits       F. Tone:        Observed  F. Movement:    Observed                   Score:          8/8  F. Breathing:   Observed ---------------------------------------------------------------------- Biometry  BPD:      91.2  mm     G. Age:  37w 0d         81  %    CI:        79.36   %    70 - 86                                                          FL/HC:      20.4   %    20.1 - 22.1  HC:      323.6  mm     G. Age:  36w 4d         30  %    HC/AC:      0.92        0.93 - 1.11  AC:      349.9  mm     G. Age:  38w 6d       > 99  %    FL/BPD:     72.4   %    71 - 87  FL:         66  mm     G. Age:  34w 0d          5  %    FL/AC:      18.9   %    20 - 24  HUM:      61.5  mm     G. Age:  35w 4d         54  %  Est. FW:    3176  gm           7 lb     82  % ---------------------------------------------------------------------- OB History   Gravidity:    3         Term:   1  TOP:          1        Living:  1 ---------------------------------------------------------------------- Gestational Age  LMP:           35w 3d        Date:  08/13/18                 EDD:   05/20/19  U/S Today:     36w 4d                                        EDD:   05/12/19  Best:          36w 1d     Det. ByMarcella Dubs         EDD:   05/15/19                                      (10/28/18) ---------------------------------------------------------------------- Anatomy  Cranium:               Appears normal         Aortic Arch:            Appears normal  Cavum:                 Appears normal         Ductal Arch:            Previously seen  Ventricles:            Appears normal         Diaphragm:              Appears normal  Choroid Plexus:        Previously seen        Stomach:                Appears normal, left                                                                        sided  Cerebellum:            Previously seen        Abdomen:                Appears normal  Posterior Fossa:       Previously seen        Abdominal Wall:         Previously seen  Nuchal Fold:           Previously seen        Cord Vessels:           Previously seen  Face:                  Orbits and profile     Kidneys:                Appear normal                         previously seen  Lips:                  Previously seen        Bladder:                Appears normal  Thoracic:              Previously seen        Spine:                  Previously seen  Heart:                 Previously seen        Upper Extremities:      Previously seen  RVOT:                  Previously seen        Lower Extremities:      Previously seen  LVOT:                  Previously seen  Other:  Heels previously visualized.  Nasal bone visualized previously.          Technically difficult due to advanced GA and fetal position. ---------------------------------------------------------------------- Comments  This  patient was seen for a follow up growth scan due to a  history of chronic hypertension that is currently treated with  Procardia.  She denies any problems since her last exam.  She was informed that the fetal growth and amniotic fluid  level appears appropriate for her gestational age.  A biophysical profile performed today was 8 out of 8.  Another biophysical profile was scheduled in 1 week. ----------------------------------------------------------------------                   Johnell Comings, MD Electronically Signed Final Report   04/18/2019 09:59 am ----------------------------------------------------------------------   Assessment and Plan:  Pregnancy: G3P1011 at [redacted]w[redacted]d 1. Sickle cell trait in mother affecting pregnancy ([place])   2. Supervision of high risk pregnancy, antepartum Patient is doing well without complaints  3. Chronic hypertension affecting pregnancy Patient unable to check BP Continue procardia and ASA Follow up ultrasound scheduled 04/25/19 Discussed IOL at 39 weeks and patient desires to delay IOL 3/13  4. Systemic lupus erythematosus, unspecified SLE type, unspecified organ involvement status ([place]) Stable on plaquenil  Preterm labor symptoms and general obstetric precautions including but not limited to vaginal bleeding, contractions, leaking of fluid and fetal movement were reviewed in detail with the patient. I discussed the assessment and treatment plan with the patient. The patient was provided an opportunity to ask questions and all were answered. The patient agreed with the plan and demonstrated an  understanding of the instructions. The patient was advised to call back or seek an in-person office evaluation/go to MAU at [facility] for any urgent or concerning symptoms. Please refer to After Visit Summary for other counseling recommendations.   I provided 11 minutes of face-to-face time during this encounter.  Return in about 1 week (around 04/30/2019)  for Virtual, ROB, High risk.  Future Appointments  Date Time Provider Department Center  04/25/2019  1:45 PM [facility]-M[facility] NURSE [facility]-US  04/25/2019  1:45 PM [facility] [place] 2 WH-MFCUS MFC-US  05/02/2019  3:00 PM [facility] NURSE [facility]  05/02/2019  3:00 PM [facility] [place] 3 WH-MFCUS MFC-US    Catalina Antigua, MD Center for [employer], [facility]

## 2019-04-25 ENCOUNTER — Ambulatory Visit (HOSPITAL_COMMUNITY): Payer: Medicaid Other

## 2019-04-25 ENCOUNTER — Other Ambulatory Visit: Payer: Self-pay

## 2019-04-25 ENCOUNTER — Ambulatory Visit (HOSPITAL_COMMUNITY)
Admission: RE | Admit: 2019-04-25 | Discharge: 2019-04-25 | Disposition: A | Discharge status: Home / Self Care | Payer: Medicaid Other | Source: Ambulatory Visit | Attending: Obstetrics | Admitting: Obstetrics

## 2019-04-25 ENCOUNTER — Encounter (HOSPITAL_COMMUNITY): Payer: Self-pay

## 2019-04-25 ENCOUNTER — Ambulatory Visit (HOSPITAL_COMMUNITY): Payer: Medicaid Other | Admitting: *Deleted

## 2019-04-25 DIAGNOSIS — O99019 Anemia complicating pregnancy, unspecified trimester: Secondary | ICD-10-CM | POA: Insufficient documentation

## 2019-04-25 DIAGNOSIS — D573 Sickle-cell trait: Secondary | ICD-10-CM | POA: Diagnosis present

## 2019-04-25 DIAGNOSIS — O10913 Unspecified pre-existing hypertension complicating pregnancy, third trimester: Secondary | ICD-10-CM

## 2019-04-25 DIAGNOSIS — O99213 Obesity complicating pregnancy, third trimester: Secondary | ICD-10-CM | POA: Diagnosis not present

## 2019-04-25 DIAGNOSIS — M329 Systemic lupus erythematosus, unspecified: Secondary | ICD-10-CM

## 2019-04-25 DIAGNOSIS — O0990 Supervision of high risk pregnancy, unspecified, unspecified trimester: Secondary | ICD-10-CM | POA: Diagnosis present

## 2019-04-25 DIAGNOSIS — O26893 Other specified pregnancy related conditions, third trimester: Secondary | ICD-10-CM

## 2019-04-25 DIAGNOSIS — O10919 Unspecified pre-existing hypertension complicating pregnancy, unspecified trimester: Secondary | ICD-10-CM | POA: Diagnosis present

## 2019-04-25 DIAGNOSIS — O10013 Pre-existing essential hypertension complicating pregnancy, third trimester: Secondary | ICD-10-CM | POA: Diagnosis not present

## 2019-04-25 DIAGNOSIS — O09523 Supervision of elderly multigravida, third trimester: Secondary | ICD-10-CM

## 2019-04-25 DIAGNOSIS — Z3A37 37 weeks gestation of pregnancy: Secondary | ICD-10-CM

## 2019-04-26 ENCOUNTER — Other Ambulatory Visit (INDEPENDENT_AMBULATORY_CARE_PROVIDER_SITE_OTHER): Payer: Self-pay | Admitting: Obstetrics and Gynecology

## 2019-04-29 ENCOUNTER — Telehealth (HOSPITAL_COMMUNITY): Payer: Self-pay | Admitting: *Deleted

## 2019-04-29 NOTE — Telephone Encounter (Signed)
Preadmission screen  

## 2019-04-30 ENCOUNTER — Telehealth (HOSPITAL_COMMUNITY): Payer: Self-pay | Admitting: *Deleted

## 2019-04-30 ENCOUNTER — Encounter: Payer: Self-pay | Admitting: Obstetrics and Gynecology

## 2019-04-30 ENCOUNTER — Encounter (HOSPITAL_COMMUNITY): Payer: Self-pay | Admitting: *Deleted

## 2019-04-30 ENCOUNTER — Telehealth (INDEPENDENT_AMBULATORY_CARE_PROVIDER_SITE_OTHER): Payer: Medicaid Other | Admitting: Obstetrics and Gynecology

## 2019-04-30 ENCOUNTER — Encounter (HOSPITAL_COMMUNITY): Payer: Self-pay

## 2019-04-30 DIAGNOSIS — O99019 Anemia complicating pregnancy, unspecified trimester: Secondary | ICD-10-CM

## 2019-04-30 DIAGNOSIS — O10913 Unspecified pre-existing hypertension complicating pregnancy, third trimester: Secondary | ICD-10-CM

## 2019-04-30 DIAGNOSIS — O99113 Other diseases of the blood and blood-forming organs and certain disorders involving the immune mechanism complicating pregnancy, third trimester: Secondary | ICD-10-CM

## 2019-04-30 DIAGNOSIS — O0990 Supervision of high risk pregnancy, unspecified, unspecified trimester: Secondary | ICD-10-CM

## 2019-04-30 DIAGNOSIS — O10919 Unspecified pre-existing hypertension complicating pregnancy, unspecified trimester: Secondary | ICD-10-CM

## 2019-04-30 DIAGNOSIS — Z3A37 37 weeks gestation of pregnancy: Secondary | ICD-10-CM

## 2019-04-30 DIAGNOSIS — O0993 Supervision of high risk pregnancy, unspecified, third trimester: Secondary | ICD-10-CM

## 2019-04-30 DIAGNOSIS — O99013 Anemia complicating pregnancy, third trimester: Secondary | ICD-10-CM

## 2019-04-30 DIAGNOSIS — M329 Systemic lupus erythematosus, unspecified: Secondary | ICD-10-CM

## 2019-04-30 DIAGNOSIS — D573 Sickle-cell trait: Secondary | ICD-10-CM

## 2019-04-30 NOTE — Progress Notes (Signed)
TELEHEALTH OBSTETRICS PRENATAL VIRTUAL VIDEO VISIT ENCOUNTER NOTE  Provider location: [facility] [place]   I connected with Natalie Chase on 04/30/19 at  9:45 AM EST by MyChart Video Encounter at home and verified that I am speaking with the correct person using two identifiers.   I discussed the limitations, risks, security and privacy concerns of performing an evaluation and management service virtually and the availability of in person appointments. I also discussed with the patient that there may be a patient responsible charge related to this service. The patient expressed understanding and agreed to proceed. Subjective:  Natalie Chase is a 39 y.o. G3P1011 at [redacted]w[redacted]d being seen today for ongoing prenatal care.  She is currently monitored for the following issues for this high-risk pregnancy and has Hypertension; Candida rash of groin; Supervision of high risk pregnancy, antepartum; Chronic hypertension affecting pregnancy; Sickle cell trait in mother affecting pregnancy (HCC); and Lupus (systemic lupus erythematosus) (HCC) on their problem list.  Patient reports no complaints.  Contractions: Irregular. Vag. Bleeding: None.  Movement: Present. Denies any leaking of fluid.   The following portions of the patient's history were reviewed and updated as appropriate: allergies, current medications, past family history, past medical history, past social history, past surgical history and problem list.   Objective:  There were no vitals filed for this visit.  Fetal Status:     Movement: Present     General:  Alert, oriented and cooperative. Patient is in no acute distress.  Respiratory: Normal respiratory effort, no problems with respiration noted  Mental Status: Normal mood and affect. Normal behavior. Normal judgment and thought content.  Rest of physical exam deferred due to type of encounter  Imaging: [place] MFM FETAL BPP WO NON STRESS  Result Date:  04/26/2019 ----------------------------------------------------------------------  OBSTETRICS REPORT                        (Signed Final 04/26/2019 07:08 am) ---------------------------------------------------------------------- Patient Info  ID #:       [id number]                          D.O.B.:  02-23-1981 (38 yrs)  Name:       Natalie Chase                   Visit Date: 04/25/2019 02:03 pm ---------------------------------------------------------------------- Performed By  Performed By:     Natalie Darling BS,      Ref. Address:      [address]                                                              [place], [place]  [address]  Attending:        Lin Landsman      [facility]                                        [facility]  Referred By:      Raelyn Mora CNM ---------------------------------------------------------------------- Orders   #  Description                          Code         Ordered By   1  [place] MFM FETAL BPP WO NON              76819.01     YU FANG      STRESS  ----------------------------------------------------------------------   #  Order #                    Accession #                 Episode #   1  [id number]                  [id number]                  [id number]  ---------------------------------------------------------------------- Indications   Hypertension - Chronic/Pre-existing            O10.019   (Procardia XL 60 mg qd, bASA)   Advanced maternal age multigravida 47+,        O28.523   third trimester   Systemic lupus complicating pregnancy,         O26.893, M32.9   third trimester   Asthma (Albuterol)                             O99.89 j45.909   Genetic carrier (Sickle Cell Carrier)          Z14.8   Obesity complicating pregnancy, third          O99.213   trimester    Marijuana Use (stopped 10/02/18)   [redacted] weeks gestation of pregnancy                Z3A.37  ---------------------------------------------------------------------- Vital Signs  Weight (lb): 211                               Height:        5'4"  BMI:         36.21 ---------------------------------------------------------------------- Fetal Evaluation  Num Of Fetuses:          1  Fetal Heart Rate(bpm):   136  Cardiac Activity:        Arrhythmia noted  Presentation:            Cephalic  Placenta:                Anterior  P. Cord Insertion:       Previously Visualized  Amniotic Fluid  AFI FV:  Within normal limits  AFI Sum(cm)     %Tile       Largest Pocket(cm)  7.99            9           4.39  RUQ(cm)                     LUQ(cm)  3.6                         4.39 ---------------------------------------------------------------------- Biophysical Evaluation  Amniotic F.V:   Pocket => 2 cm             F. Tone:         Observed  F. Movement:    Observed                   Score:           8/8  F. Breathing:   Observed ---------------------------------------------------------------------- OB History  Gravidity:    3         Term:   1  TOP:          1        Living:  1 ---------------------------------------------------------------------- Gestational Age  LMP:           36w 3d        Date:  08/13/18                 EDD:   05/20/19  Best:          37w 1d     Det. ByMarcella Dubs         EDD:   05/15/19                                      (10/28/18) ---------------------------------------------------------------------- Impression  Biophysical profile 8/8  Known chronic hypertension on therapy  SLE  Advanced maternal age  Today we observed premature atrial contractions that were  irregular in frequency. I discussed with Ms. Romero that these  are the most common arrhythmias observed in pregnancy.  Common causes included stimulates like tobacco, caffined  and amphetamines. Ms. Grall notes that she drinks  mountain dew  regularly and had mountain dew today.  I discussed that we will observe her next week for testing. ---------------------------------------------------------------------- Recommendations  Continue weekly testing.  Discontinue mountain dew use. ----------------------------------------------------------------------               Lin Landsman, MD Electronically Signed Final Report   04/26/2019 07:08 am ----------------------------------------------------------------------  [place] MFM FETAL BPP WO NON STRESS  Result Date: 04/18/2019 ----------------------------------------------------------------------  OBSTETRICS REPORT                       (Signed Final 04/18/2019 09:59 am) ---------------------------------------------------------------------- Patient Info  ID #:       [id number]                          D.O.B.:  09-07-1980 (38 yrs)  Name:       Natalie Chase                   Visit Date: 04/18/2019 09:33 am ---------------------------------------------------------------------- Performed By  Performed By:     Hurman Horn          Ref.  Address:     [address]                                                             [place], [place]                                                             [address]  Attending:        Ma Rings MD         [facility]  Referred By:      Raelyn Mora CNM ---------------------------------------------------------------------- Orders   #  Description                          Code         Ordered By   1  [place] MFM FETAL BPP WO NON              76819.01     YU FANG      STRESS   2  [place] MFM OB FOLLOW UP                  60454.09     YU FANG  ----------------------------------------------------------------------   #  Order #                    Accession #                 Episode #   1   [id number]                  [id number]                  [id number]   2  [id number]                  [id number]                  [id number]  ---------------------------------------------------------------------- Indications   Hypertension - Chronic/Pre-existing            O10.019   (  Procardia XL 60 mg qd, bASA)   Advanced maternal age multigravida 52+,        O52.523   third trimester   [redacted] weeks gestation of pregnancy                Z3A.36   Systemic lupus complicating pregnancy,         O26.893, M32.9   third trimester   Asthma (Albuterol)                             O99.89 j45.909   Genetic carrier (Sickle Cell Carrier)          Z14.8   Obesity complicating pregnancy, third          O99.213   trimester   Marijuana Use (stopped 10/02/18)   Advanced maternal age multigravida 56+,        O8.523   third trimester  ---------------------------------------------------------------------- Vital Signs                                                 Height:        5'4" ---------------------------------------------------------------------- Fetal Evaluation  Num Of Fetuses:         1  Fetal Heart Rate(bpm):  153  Cardiac Activity:       Observed  Presentation:           Cephalic  Placenta:               Anterior  P. Cord Insertion:      Visualized, central  Amniotic Fluid  AFI FV:      Within normal limits  AFI Sum(cm)     %Tile       Largest Pocket(cm)  12.39           40          4.21  RUQ(cm)       RLQ(cm)       LUQ(cm)        LLQ(cm)  4.21          1.67          3.19           3.32 ---------------------------------------------------------------------- Biophysical Evaluation  Amniotic F.V:   Within normal limits       F. Tone:        Observed  F. Movement:    Observed                   Score:          8/8  F. Breathing:   Observed ---------------------------------------------------------------------- Biometry  BPD:      91.2  mm     G. Age:  37w 0d         81  %    CI:        79.36   %    70 - 86                                                           FL/HC:      20.4   %  20.1 - 22.1  HC:      323.6  mm     G. Age:  36w 4d         30  %    HC/AC:      0.92        0.93 - 1.11  AC:      349.9  mm     G. Age:  38w 6d       > 99  %    FL/BPD:     72.4   %    71 - 87  FL:         66  mm     G. Age:  34w 0d          5  %    FL/AC:      18.9   %    20 - 24  HUM:      61.5  mm     G. Age:  35w 4d         54  %  Est. FW:    3176  gm           7 lb     82  % ---------------------------------------------------------------------- OB History  Gravidity:    3         Term:   1  TOP:          1        Living:  1 ---------------------------------------------------------------------- Gestational Age  LMP:           35w 3d        Date:  08/13/18                 EDD:   05/20/19  U/S Today:     36w 4d                                        EDD:   05/12/19  Best:          36w 1d     Det. By:  Marcella Dubs         EDD:   05/15/19                                      (10/28/18) ---------------------------------------------------------------------- Anatomy  Cranium:               Appears normal         Aortic Arch:            Appears normal  Cavum:                 Appears normal         Ductal Arch:            Previously seen  Ventricles:            Appears normal         Diaphragm:              Appears normal  Choroid Plexus:        Previously seen        Stomach:                Appears normal, left  sided  Cerebellum:            Previously seen        Abdomen:                Appears normal  Posterior Fossa:       Previously seen        Abdominal Wall:         Previously seen  Nuchal Fold:           Previously seen        Cord Vessels:           Previously seen  Face:                  Orbits and profile     Kidneys:                Appear normal                         previously seen  Lips:                  Previously seen        Bladder:                Appears normal  Thoracic:               Previously seen        Spine:                  Previously seen  Heart:                 Previously seen        Upper Extremities:      Previously seen  RVOT:                  Previously seen        Lower Extremities:      Previously seen  LVOT:                  Previously seen  Other:  Heels previously visualized.  Nasal bone visualized previously.          Technically difficult due to advanced GA and fetal position. ---------------------------------------------------------------------- Comments  This patient was seen for a follow up growth scan due to a  history of chronic hypertension that is currently treated with  Procardia.  She denies any problems since her last exam.  She was informed that the fetal growth and amniotic fluid  level appears appropriate for her gestational age.  A biophysical profile performed today was 8 out of 8.  Another biophysical profile was scheduled in 1 week. ----------------------------------------------------------------------                   Ma RingsVictor Fang, MD Electronically Signed Final Report   04/18/2019 09:59 am ----------------------------------------------------------------------  US MFM FETAL BPP WO NON STRESS  Result Date: 04/11/2019 ----------------------------------------------------------------------  OBSTETRICS REPORT                       (Signed Final 04/11/2019 04:19 pm) ---------------------------------------------------------------------- Patient Info  ID #:       [id number]                          D.O.B.:  1981/02/12 (38 yrs)  Name:       Natalie Chase  Visit Date: 04/11/2019 03:26 pm ---------------------------------------------------------------------- Performed By  Performed By:     Tommie RaymondMesha Tester BS,       Ref. Address:     [address]                    [facility]                                                             [address]                                                             [place], [place]                                                              [id number]  Attending:        Ma RingsVictor Fang MD         [facility]  Referred By:      Raelyn MoraOLITTA                    DAWSON CNM ---------------------------------------------------------------------- Orders   #  Description                          Code         Ordered By   1  US MFM FETAL BPP WO NON              57846.9676819.01     YU FANG      STRESS  ----------------------------------------------------------------------   #  Order #                    Accession #                 Episode #   1  [id number]                  [id number]  ---------------------------------------------------------------------- Indications   Hypertension - Chronic/Pre-existing            O10.019   (Procardia XL 60 mg qd, bASA)   [redacted] weeks gestation of pregnancy                Z3A.2335   Advanced maternal age multigravida 2835+,        66O09.523   third trimester  Systemic lupus complicating pregnancy,         O26.893, M32.9   third trimester   Asthma (Albuterol)                             O99.89 j45.909   Genetic carrier (Sickle Cell Carrier)          Z14.8   Obesity complicating pregnancy, third          O99.213   trimester   Marijuana Use (stopped 10/02/18)   Advanced maternal age multigravida 13+,        O79.523   third trimester  ---------------------------------------------------------------------- Vital Signs                                                 Height:        5'4" ---------------------------------------------------------------------- Fetal Evaluation  Num Of Fetuses:         1  Fetal Heart Rate(bpm):  148  Cardiac Activity:       Observed  Presentation:           Cephalic  Placenta:               Left lateral  P. Cord Insertion:      Previously Visualized  Amniotic Fluid  AFI FV:      Within normal limits  AFI Sum(cm)     %Tile       Largest Pocket(cm)  14.41           52          4.61  RUQ(cm)        RLQ(cm)       LUQ(cm)        LLQ(cm)  4.47          3.46          1.87           4.61 ---------------------------------------------------------------------- Biophysical Evaluation  Amniotic F.V:   Pocket => 2 cm             F. Tone:        Observed  F. Movement:    Observed                   Score:          8/8  F. Breathing:   Observed ---------------------------------------------------------------------- Biometry  LV:        3.3  mm ---------------------------------------------------------------------- OB History  Gravidity:    3         Term:   1  TOP:          1        Living:  1 ---------------------------------------------------------------------- Gestational Age  LMP:           34w 3d        Date:  08/13/18                 EDD:   05/20/19  Best:          35w 1d     Det. ByMarcella Dubs         EDD:   05/15/19                                      (  10/28/18) ---------------------------------------------------------------------- Anatomy  Cranium:               Previously seen        Aortic Arch:            Previously seen  Cavum:                 Previously seen        Ductal Arch:            Previously seen  Ventricles:            Appears normal         Diaphragm:              Previously seen  Choroid Plexus:        Previously seen        Stomach:                Appears normal, left                                                                        sided  Cerebellum:            Previously seen        Abdomen:                Previously seen  Posterior Fossa:       Previously seen        Abdominal Wall:         Previously seen  Nuchal Fold:           Previously seen        Cord Vessels:           Previously seen  Face:                  Orbits and profile     Kidneys:                Appear normal                         previously seen  Lips:                  Previously seen        Bladder:                Appears normal  Thoracic:              Previously seen        Spine:                  Previously seen   Heart:                 Appears normal         Upper Extremities:      Previously seen                         (4CH, axis, and                         situs)  RVOT:  Previously seen        Lower Extremities:      Previously seen  LVOT:                  Previously seen  Other:  Heels previously visualized.  Nasal bone visualized previously.          Technically difficult due to advanced GA and fetal position. ---------------------------------------------------------------------- Cervix Uterus Adnexa  Cervix  Not visualized (advanced GA >24wks)  Uterus  No abnormality visualized.  Left Ovary  Within normal limits. No adnexal mass visualized.  Right Ovary  Within normal limits. No adnexal mass visualized.  Cul De Sac  No free fluid seen.  Adnexa  No abnormality visualized. ---------------------------------------------------------------------- Comments  This patient was seen for a biophysical profile due to a history  of chronic hypertension that is currently treated with  nifedipine.  She denies any problems since her last exam.  A biophysical profile performed today was 8 out of 8.  There was normal amniotic fluid noted on today's ultrasound  exam.  Another biophysical profile and growth ultrasound was  scheduled in 1 week. ----------------------------------------------------------------------                   Ma Rings, MD Electronically Signed Final Report   04/11/2019 04:19 pm ----------------------------------------------------------------------  [place] MFM FETAL BPP WO NON STRESS  Result Date: 04/04/2019 ----------------------------------------------------------------------  OBSTETRICS REPORT                       (Signed Final 04/04/2019 03:50 pm) ---------------------------------------------------------------------- Patient Info  ID #:       [id number]                          D.O.B.:  11-16-1980 (38 yrs)  Name:       Natalie Chase                   Visit Date: 04/04/2019 03:05 pm  ---------------------------------------------------------------------- Performed By  Performed By:     Natalie Darling BS,      Ref. Address:     [address]                                                             [place], [place]                                                             [address]  Attending:        Ma Rings MD         [facility]  Referred By:      Raelyn Mora CNM ---------------------------------------------------------------------- Orders   #  Description                          Code         Ordered By   1  [place] MFM FETAL BPP WO NON              76819.01     [facility]      STRESS  ----------------------------------------------------------------------   #  Order #                    Accession #                 Episode #   1  [id number]                  [id number]                  [id number]  ---------------------------------------------------------------------- Indications   Hypertension - Chronic/Pre-existing            O10.019   (Procardia XL 60 mg qd, bASA)   Advanced maternal age multigravida 20+,        O54.523   third trimester   Systemic lupus complicating pregnancy,         O26.892, M32.9   second trimester   Asthma (Albuterol)                             O99.89 j45.909   [redacted] weeks gestation of pregnancy                Z3A.34   Genetic carrier (Sickle Cell Carrier)          Z14.8   Obesity complicating pregnancy, third          O99.213   trimester   Marijuana Use (stopped 10/02/18)  ---------------------------------------------------------------------- Vital Signs                                                 Height:        5'4" ---------------------------------------------------------------------- Fetal Evaluation  Num Of Fetuses:         1  Fetal Heart Rate(bpm):  142  Cardiac  Activity:       Observed  Presentation:           Cephalic  Placenta:               Left lateral  P. Cord Insertion:      Previously Visualized  Amniotic Fluid  AFI FV:      Within normal limits  AFI Sum(cm)     %Tile       Largest Pocket(cm)  12.54  38          4.4  RUQ(cm)       RLQ(cm)       LUQ(cm)        LLQ(cm)  2.5           4.07          4.4            1.57 ---------------------------------------------------------------------- Biophysical Evaluation  Amniotic F.V:   Pocket => 2 cm             F. Tone:        Observed  F. Movement:    Observed                   Score:          8/8  F. Breathing:   Observed ---------------------------------------------------------------------- OB History  Gravidity:    3         Term:   1  TOP:          1        Living:  1 ---------------------------------------------------------------------- Gestational Age  LMP:           33w 3d        Date:  08/13/18                 EDD:   05/20/19  Best:          34w 1d     Det. By:  Marcella Dubs         EDD:   05/15/19                                      (10/28/18) ---------------------------------------------------------------------- Comments  This patient was seen for a biophysical profile due to a history  of chronic hypertension currently treated with Procardia and  due to a history of lupus.  She denies any problems since her  last exam.  A biophysical profile performed today was 8 out of 8.  There was normal amniotic fluid noted on today's ultrasound  exam.  Another biophysical profile was scheduled in 1 week. ----------------------------------------------------------------------                   Ma Rings, MD Electronically Signed Final Report   04/04/2019 03:50 pm ----------------------------------------------------------------------  [place] MFM OB FOLLOW UP  Result Date: 04/18/2019 ----------------------------------------------------------------------  OBSTETRICS REPORT                       (Signed Final  04/18/2019 09:59 am) ---------------------------------------------------------------------- Patient Info  ID #:       [id number]                          D.O.B.:  May 11, 1980 (38 yrs)  Name:       Natalie Chase                   Visit Date: 04/18/2019 09:33 am ---------------------------------------------------------------------- Performed By  Performed By:     Hurman Horn          Ref. Address:     [address]                    [facility]  [place], [place]  Attending:        Johnell Comings MD         [facility]  Referred By:      Laury Deep CNM ---------------------------------------------------------------------- Orders   #  Description                          Code         Ordered By   1  [place] MFM FETAL BPP WO NON              76819.01     YU FANG      STRESS   2  [place] MFM OB FOLLOW UP                  76816.01     YU FANG  ----------------------------------------------------------------------   #  Order #                    Accession #                 Episode #   1  [id number]                  [id number]   2  [id number]                  [id number]                  [id number]  ---------------------------------------------------------------------- Indications   Hypertension - Chronic/Pre-existing            O10.019   (Procardia XL 60 mg qd, bASA)   Advanced maternal age multigravida 20+,        O28.523   third trimester   [redacted] weeks gestation of pregnancy                Z3A.36   Systemic lupus complicating pregnancy,         O26.893, M32.9   third trimester   Asthma (Albuterol)  O99.89 j45.909   Genetic carrier (Sickle Cell Carrier)          Z14.8   Obesity complicating  pregnancy, third          O99.213   trimester   Marijuana Use (stopped 10/02/18)   Advanced maternal age multigravida 16+,        O75.523   third trimester  ---------------------------------------------------------------------- Vital Signs                                                 Height:        5'4" ---------------------------------------------------------------------- Fetal Evaluation  Num Of Fetuses:         1  Fetal Heart Rate(bpm):  153  Cardiac Activity:       Observed  Presentation:           Cephalic  Placenta:               Anterior  P. Cord Insertion:      Visualized, central  Amniotic Fluid  AFI FV:      Within normal limits  AFI Sum(cm)     %Tile       Largest Pocket(cm)  12.39           40          4.21  RUQ(cm)       RLQ(cm)       LUQ(cm)        LLQ(cm)  4.21          1.67          3.19           3.32 ---------------------------------------------------------------------- Biophysical Evaluation  Amniotic F.V:   Within normal limits       F. Tone:        Observed  F. Movement:    Observed                   Score:          8/8  F. Breathing:   Observed ---------------------------------------------------------------------- Biometry  BPD:      91.2  mm     G. Age:  37w 0d         81  %    CI:        79.36   %    70 - 86                                                          FL/HC:      20.4   %    20.1 - 22.1  HC:      323.6  mm     G. Age:  36w 4d         30  %    HC/AC:      0.92        0.93 - 1.11  AC:      349.9  mm     G. Age:  38w 6d       > 99  %    FL/BPD:     72.4   %    71 - 87  FL:         66  mm     G. Age:  34w 0d          5  %    FL/AC:      18.9   %    20 - 24  HUM:      61.5  mm     G. Age:  35w 4d         54  %  Est. FW:    3176  gm           7 lb     82  % ---------------------------------------------------------------------- OB History  Gravidity:    3         Term:   1  TOP:          1        Living:  1 ----------------------------------------------------------------------  Gestational Age  LMP:           35w 3d        Date:  08/13/18                 EDD:   05/20/19  U/S Today:     36w 4d                                        EDD:   05/12/19  Best:          36w 1d     Det. ByMarcella Dubs         EDD:   05/15/19                                      (10/28/18) ---------------------------------------------------------------------- Anatomy  Cranium:               Appears normal         Aortic Arch:            Appears normal  Cavum:                 Appears normal         Ductal Arch:            Previously seen  Ventricles:            Appears normal         Diaphragm:              Appears normal  Choroid Plexus:        Previously seen        Stomach:                Appears normal, left                                                                        sided  Cerebellum:            Previously seen        Abdomen:                Appears normal  Posterior Fossa:       Previously seen        Abdominal Wall:         Previously seen  Nuchal Fold:           Previously seen        Cord Vessels:           Previously seen  Face:                  Orbits and profile     Kidneys:                Appear normal                         previously seen  Lips:                  Previously seen        Bladder:                Appears normal  Thoracic:              Previously seen        Spine:                  Previously seen  Heart:                 Previously seen        Upper Extremities:      Previously seen  RVOT:                  Previously seen        Lower Extremities:      Previously seen  LVOT:                  Previously seen  Other:  Heels previously visualized.  Nasal bone visualized previously.          Technically difficult due to advanced GA and fetal position. ---------------------------------------------------------------------- Comments  This patient was seen for a follow up growth scan due to a  history of chronic hypertension that is currently treated with  Procardia.  She denies  any problems since her last exam.  She was informed that the fetal growth and amniotic fluid  level appears appropriate for her gestational age.  A biophysical profile performed today was 8 out of 8.  Another biophysical profile was scheduled in 1 week. ----------------------------------------------------------------------                   Ma Rings, MD Electronically Signed Final Report   04/18/2019 09:59 am ----------------------------------------------------------------------   Assessment and Plan:  Pregnancy: G3P1011 at [redacted]w[redacted]d 1. Supervision of high risk pregnancy, antepartum Patient is doing well without complaints Patient unable to take BP but plans to call us later with regarding  2. Chronic hypertension affecting pregnancy Continue procardia and ASA IOL scheduled on 3/13  3. Systemic lupus erythematosus, unspecified SLE type, unspecified organ involvement status (HCC) Stable on plaquenil   Term labor symptoms and general obstetric precautions including but not limited to vaginal bleeding, contractions, leaking of fluid and fetal movement were reviewed in detail with the patient. I discussed the assessment and treatment plan with the patient. The patient was provided an opportunity to ask questions and all were answered. The patient agreed with the plan and demonstrated an understanding of the instructions. The patient was advised to call back or seek an in-person  office evaluation/go to MAU at [facility] for any urgent or concerning symptoms. Please refer to After Visit Summary for other counseling recommendations.   I provided 11 minutes of face-to-face time during this encounter.  Return in about 1 week (around 05/07/2019) for Virtual, ROB, High risk.  Future Appointments  Date Time Provider Department Center  04/30/2019  9:45 AM Aziah Kaiser, Gigi Gin, MD [facility] None  05/02/2019  3:00 PM [facility] NURSE [facility]  05/02/2019  3:00 PM [facility] [place] 3 [facility]    05/08/2019  9:30 AM [facility]-SCREENING [facility] None  05/10/2019  7:00 AM MC-LD SCHED ROOM [facility] None    Catalina Antigua, MD [facility] for [employer], [facility]

## 2019-04-30 NOTE — Telephone Encounter (Signed)
Pt had planned for a duola to accompany her during labor.  The duola is from [place].  Explained to pt that duolas are only allowed if they have completed requirements with [facility] prior to coming and she would not be allowed to enter the building.

## 2019-04-30 NOTE — Telephone Encounter (Signed)
Preadmission screen  

## 2019-04-30 NOTE — Progress Notes (Signed)
S/w pt for virtual visit, pt reports fetal movement with irregular contractions. Pt does not have BP cuff with her but states that she will take it later and send message with results.

## 2019-04-30 NOTE — Telephone Encounter (Signed)
Sent copy of Duola requirements to the patient via My Chart.  Discussed the policy with the patient and answered her questions.  Stress, the requirements were to guarantee patients safety and optimal care.  Pt verbalized understanding.

## 2019-05-02 ENCOUNTER — Ambulatory Visit (HOSPITAL_COMMUNITY): Payer: Medicaid Other | Admitting: *Deleted

## 2019-05-02 ENCOUNTER — Encounter (HOSPITAL_COMMUNITY): Payer: Self-pay

## 2019-05-02 ENCOUNTER — Ambulatory Visit (HOSPITAL_COMMUNITY)
Admission: RE | Admit: 2019-05-02 | Discharge: 2019-05-02 | Disposition: A | Discharge status: Home / Self Care | Payer: Medicaid Other | Source: Ambulatory Visit | Attending: Obstetrics and Gynecology | Admitting: Obstetrics and Gynecology

## 2019-05-02 ENCOUNTER — Other Ambulatory Visit: Payer: Self-pay

## 2019-05-02 DIAGNOSIS — O10013 Pre-existing essential hypertension complicating pregnancy, third trimester: Secondary | ICD-10-CM | POA: Diagnosis not present

## 2019-05-02 DIAGNOSIS — O99019 Anemia complicating pregnancy, unspecified trimester: Secondary | ICD-10-CM | POA: Diagnosis present

## 2019-05-02 DIAGNOSIS — O26893 Other specified pregnancy related conditions, third trimester: Secondary | ICD-10-CM | POA: Diagnosis not present

## 2019-05-02 DIAGNOSIS — O0990 Supervision of high risk pregnancy, unspecified, unspecified trimester: Secondary | ICD-10-CM | POA: Diagnosis present

## 2019-05-02 DIAGNOSIS — O10913 Unspecified pre-existing hypertension complicating pregnancy, third trimester: Secondary | ICD-10-CM | POA: Diagnosis present

## 2019-05-02 DIAGNOSIS — O10919 Unspecified pre-existing hypertension complicating pregnancy, unspecified trimester: Secondary | ICD-10-CM | POA: Insufficient documentation

## 2019-05-02 DIAGNOSIS — D573 Sickle-cell trait: Secondary | ICD-10-CM

## 2019-05-02 DIAGNOSIS — O09523 Supervision of elderly multigravida, third trimester: Secondary | ICD-10-CM | POA: Diagnosis not present

## 2019-05-02 DIAGNOSIS — Z3A38 38 weeks gestation of pregnancy: Secondary | ICD-10-CM

## 2019-05-05 ENCOUNTER — Other Ambulatory Visit (HOSPITAL_COMMUNITY): Payer: Self-pay | Admitting: *Deleted

## 2019-05-05 ENCOUNTER — Other Ambulatory Visit: Payer: Self-pay | Admitting: Advanced Practice Midwife

## 2019-05-05 DIAGNOSIS — O10913 Unspecified pre-existing hypertension complicating pregnancy, third trimester: Secondary | ICD-10-CM

## 2019-05-06 ENCOUNTER — Telehealth (INDEPENDENT_AMBULATORY_CARE_PROVIDER_SITE_OTHER): Payer: Medicaid Other | Admitting: Obstetrics & Gynecology

## 2019-05-06 DIAGNOSIS — Z3A38 38 weeks gestation of pregnancy: Secondary | ICD-10-CM | POA: Diagnosis not present

## 2019-05-06 DIAGNOSIS — M329 Systemic lupus erythematosus, unspecified: Secondary | ICD-10-CM

## 2019-05-06 DIAGNOSIS — O99891 Other specified diseases and conditions complicating pregnancy: Secondary | ICD-10-CM

## 2019-05-06 DIAGNOSIS — O0990 Supervision of high risk pregnancy, unspecified, unspecified trimester: Secondary | ICD-10-CM

## 2019-05-06 DIAGNOSIS — O10919 Unspecified pre-existing hypertension complicating pregnancy, unspecified trimester: Secondary | ICD-10-CM

## 2019-05-06 NOTE — Progress Notes (Signed)
Pt is on the phone preparing for virtual visit with provider. [redacted]w[redacted]d. Pt is at work and not able to check BP currently, pt will check BP when she gets home from work and put it into [employer]. Pt denies HA's, blurry vision, or swelling.

## 2019-05-06 NOTE — Patient Instructions (Signed)
Return to office for any scheduled appointments. Call the office or go to the MAU at [facility] [place] if:  You begin to have strong, frequent contractions  Your water breaks.  Sometimes it is a big gush of fluid, sometimes it is just a trickle that keeps getting your panties wet or running down your legs  You have vaginal bleeding.  It is normal to have a small amount of spotting if your cervix was checked.   You do not feel your baby moving like normal.  If you do not, get something to eat and drink and lay down and focus on feeling your baby move.   If your baby is still not moving like normal, you should call the office or go to MAU.  Any other obstetric concerns.   Labor Induction  Labor induction is when steps are taken to cause a pregnant woman to begin the labor process. Most women go into labor on their own between 37 weeks and 42 weeks of pregnancy. When this does not happen or when there is a medical need for labor to begin, steps may be taken to induce labor. Labor induction causes a pregnant woman's uterus to contract. It also causes the cervix to soften (ripen), open (dilate), and thin out (efface). Usually, labor is not induced before 39 weeks of pregnancy unless there is a medical reason to do so. Your health care provider will determine if labor induction is needed. Before inducing labor, your health care provider will consider a number of factors, including:  Your medical condition and your baby's.  How many weeks along you are in your pregnancy.  How mature your baby's lungs are.  The condition of your cervix.  The position of your baby.  The size of your birth canal. What are some reasons for labor induction? Labor may be induced if:  Your health or your baby's health is at risk.  Your pregnancy is overdue by 1 week or more.  Your water breaks but labor does not start on its own.  There is a low amount of amniotic fluid around your  baby. You may also choose (elect) to have labor induced at a certain time. Generally, elective labor induction is done no earlier than 39 weeks of pregnancy. What methods are used for labor induction? Methods used for labor induction include:  Prostaglandin medicine. This medicine starts contractions and causes the cervix to dilate and ripen. It can be taken by mouth (orally) or by being inserted into the vagina (suppository).  Inserting a small, thin tube (catheter) with a balloon into the vagina and then expanding the balloon with water to dilate the cervix.  Stripping the membranes. In this method, your health care provider gently separates amniotic sac tissue from the cervix. This causes the cervix to stretch, which in turn causes the release of a hormone called progesterone. The hormone causes the uterus to contract. This procedure is often done during an office visit, after which you will be sent home to wait for contractions to begin.  Breaking the water. In this method, your health care provider uses a small instrument to make a small hole in the amniotic sac. This eventually causes the amniotic sac to break. Contractions should begin after a few hours.  Medicine to trigger or strengthen contractions. This medicine is given through an IV that is inserted into a vein in your arm. Except for membrane stripping, which can be done in a clinic, labor   induction is done in the hospital so that you and your baby can be carefully monitored. How long does it take for labor to be induced? The length of time it takes to induce labor depends on how ready your body is for labor. Some inductions can take up to 2-3 days, while others may take less than a day. Induction may take longer if:  You are induced early in your pregnancy.  It is your first pregnancy.  Your cervix is not ready. What are some risks associated with labor induction? Some risks associated with labor induction include:  Changes  in fetal heart rate, such as being too high, too low, or irregular (erratic).  Failed induction.  Infection in the mother or the baby.  Increased risk of having a cesarean delivery.  Fetal death.  Breaking off (abruption) of the placenta from the uterus (rare).  Rupture of the uterus (very rare). When induction is needed for medical reasons, the benefits of induction generally outweigh the risks. What are some reasons for not inducing labor? Labor induction should not be done if:  Your baby does not tolerate contractions.  You have had previous surgeries on your uterus, such as a myomectomy, removal of fibroids, or a vertical scar from a previous cesarean delivery.  Your placenta lies very low in your uterus and blocks the opening of the cervix (placenta previa).  Your baby is not in a head-down position.  The umbilical cord drops down into the birth canal in front of the baby.  There are unusual circumstances, such as the baby being very early (premature).  You have had more than 2 previous cesarean deliveries. Summary  Labor induction is when steps are taken to cause a pregnant woman to begin the labor process.  Labor induction causes a pregnant woman's uterus to contract. It also causes the cervix to ripen, dilate, and efface.  Labor is not induced before 39 weeks of pregnancy unless there is a medical reason to do so.  When induction is needed for medical reasons, the benefits of induction generally outweigh the risks. This information is not intended to replace advice given to you by your health care provider. Make sure you discuss any questions you have with your health care provider. Document Revised: 02/16/2017 Document Reviewed: 03/29/2016 Elsevier Patient Education  2020 [employer]  

## 2019-05-06 NOTE — Progress Notes (Signed)
TELEHEALTH VIRTUAL OBSTETRICS VISIT ENCOUNTER NOTE  I connected with Natalie Chase on 05/06/19 at  3:15 PM EST by telephone at home and verified that I am speaking with the correct person using two identifiers.   I discussed the limitations, risks, security and privacy concerns of performing an evaluation and management service by telephone and the availability of in person appointments. I also discussed with the patient that there may be a patient responsible charge related to this service. The patient expressed understanding and agreed to proceed. She declined video encounter today, reports she always has issues with it in the past.   Subjective:  Natalie Chase is a 39 y.o. G3P1011 at [redacted]w[redacted]d being followed for ongoing prenatal care.  She is currently monitored for the following issues for this high-risk pregnancy and has Hypertension; Candida rash of groin; Supervision of high risk pregnancy, antepartum; Chronic hypertension affecting pregnancy; Sickle cell trait in mother affecting pregnancy ([place]); and Lupus (systemic lupus erythematosus) ([place]) on their problem list.  Patient reports no complaints. Reports fetal movement. Denies any contractions, bleeding or leaking of fluid.   The following portions of the patient's history were reviewed and updated as appropriate: allergies, current medications, past family history, past medical history, past social history, past surgical history and problem list.   Objective:   General:  Alert, oriented and cooperative.   Mental Status: Normal mood and affect perceived. Normal judgment and thought content.  Rest of physical exam deferred due to type of encounter  Imaging: [place] MFM FETAL BPP WO NON STRESS  Result Date: 05/02/2019 ----------------------------------------------------------------------  OBSTETRICS REPORT                       (Signed Final 05/02/2019 03:54 pm) ---------------------------------------------------------------------- Patient  Info  ID #:       [id number]                          D.O.B.:  1981/02/08 (38 yrs)  Name:       Natalie Chase                   Visit Date: 05/02/2019 02:52 pm ---------------------------------------------------------------------- Performed By  Performed By:     Georgie Chard        Ref. Address:     [address]                    [place], [place]  [address]  Attending:        Ma Rings MD         [facility]                                                             [facility]  Referred By:      Raelyn Mora CNM ---------------------------------------------------------------------- Orders   #  Description                          Code         Ordered By   1  [place] MFM FETAL BPP WO NON              76819.01     YU FANG      STRESS  ----------------------------------------------------------------------   #  Order #                    Accession #                 Episode #   1  [id number]                  [id number]                  [id number]  ---------------------------------------------------------------------- Indications   Hypertension - Chronic/Pre-existing            O10.019   (Procardia XL 60 mg qd, bASA)   Advanced maternal age multigravida 56+,        O47.523   third trimester   Systemic lupus complicating pregnancy,         O26.893, M32.9   third trimester   Asthma (Albuterol)                             O99.89 j45.909   Genetic carrier (Sickle Cell Carrier)          Z14.8   Obesity complicating pregnancy, third          O99.213   trimester   Marijuana Use (stopped 10/02/18)   [redacted] weeks gestation of pregnancy                Z3A.38  ---------------------------------------------------------------------- Vital Signs                                                 Height:        5'4"  ---------------------------------------------------------------------- Fetal Evaluation  Num Of Fetuses:         1  Fetal Heart Rate(bpm):  148  Cardiac Activity:       Observed  Amniotic Fluid  AFI FV:      Within normal limits  AFI Sum(cm)     %Tile       Largest Pocket(cm)  10.28           28  3.99  RUQ(cm)       RLQ(cm)       LUQ(cm)        LLQ(cm)  1.87          3.99          2.2            2.23 ---------------------------------------------------------------------- Biophysical Evaluation  Amniotic F.V:   Within normal limits       F. Tone:        Observed  F. Movement:    Observed                   Score:          8/8  F. Breathing:   Observed ---------------------------------------------------------------------- OB History  Gravidity:    3         Term:   1  TOP:          1        Living:  1 ---------------------------------------------------------------------- Gestational Age  LMP:           37w 3d        Date:  08/13/18                 EDD:   05/20/19  Best:          38w 1d     Det. By:  Marcella Dubs         EDD:   05/15/19                                      (10/28/18) ---------------------------------------------------------------------- Comments  This patient was seen for a biophysical profile due to a history  of chronic hypertension currently treated with Procardia and  history of lupus.  She denies any problems since her last  exam.  A biophysical profile performed today was 8 out of 8.  There was normal amniotic fluid noted on today's ultrasound  exam.  Another biophysical profile was scheduled in 1 week.  She is  scheduled for induction in 8 days. ----------------------------------------------------------------------                   Ma Rings, MD Electronically Signed Final Report   05/02/2019 03:54 pm ----------------------------------------------------------------------  [place] MFM FETAL BPP WO NON STRESS  Result Date:  04/26/2019 ----------------------------------------------------------------------  OBSTETRICS REPORT                        (Signed Final 04/26/2019 07:08 am) ---------------------------------------------------------------------- Patient Info  ID #:       [id number]                          D.O.B.:  1980/06/11 (38 yrs)  Name:       Natalie Chase                   Visit Date: 04/25/2019 02:03 pm ---------------------------------------------------------------------- Performed By  Performed By:     Natalie Darling BS,      Ref. Address:      [address]                    [facility]  [address]                                                              [place], [place]                                                              [address]  Attending:        Lin Landsman      Location[facility]                    MD                                        [facility]  Referred By:      Raelyn Mora CNM ---------------------------------------------------------------------- Orders   #  Description                          Code         Ordered By   1  [place] MFM FETAL BPP WO NON              76819.01     YU FANG      STRESS  ----------------------------------------------------------------------   #  Order #                    Accession #                 Episode #   1  [id number]                  [id number]                  [id number]  ---------------------------------------------------------------------- Indications   Hypertension - Chronic/Pre-existing            O10.019   (Procardia XL 60 mg qd, bASA)   Advanced maternal age multigravida 68+,        O45.523   third trimester   Systemic lupus complicating pregnancy,         O26.893, M32.9   third trimester   Asthma (Albuterol)                             O99.89 j45.909   Genetic carrier (Sickle Cell Carrier)          Z14.8   Obesity complicating pregnancy, third          O99.213   trimester    Marijuana Use (stopped 10/02/18)   [redacted] weeks gestation of pregnancy                Z3A.37  ---------------------------------------------------------------------- Vital Signs  Weight (lb): 211  Height:        5'4"  BMI:         36.21 ---------------------------------------------------------------------- Fetal Evaluation  Num Of Fetuses:          1  Fetal Heart Rate(bpm):   136  Cardiac Activity:        Arrhythmia noted  Presentation:            Cephalic  Placenta:                Anterior  P. Cord Insertion:       Previously Visualized  Amniotic Fluid  AFI FV:      Within normal limits  AFI Sum(cm)     %Tile       Largest Pocket(cm)  7.99            9           4.39  RUQ(cm)                     LUQ(cm)  3.6                         4.39 ---------------------------------------------------------------------- Biophysical Evaluation  Amniotic F.V:   Pocket => 2 cm             F. Tone:         Observed  F. Movement:    Observed                   Score:           8/8  F. Breathing:   Observed ---------------------------------------------------------------------- OB History  Gravidity:    3         Term:   1  TOP:          1        Living:  1 ---------------------------------------------------------------------- Gestational Age  LMP:           36w 3d        Date:  08/13/18                 EDD:   05/20/19  Best:          37w 1d     Det. ByMarcella Dubs         EDD:   05/15/19                                      (10/28/18) ---------------------------------------------------------------------- Impression  Biophysical profile 8/8  Known chronic hypertension on therapy  SLE  Advanced maternal age  Today we observed premature atrial contractions that were  irregular in frequency. I discussed with Ms. Reifsteck that these  are the most common arrhythmias observed in pregnancy.  Common causes included stimulates like tobacco, caffined  and amphetamines. Ms. Hoglund notes that she drinks  mountain dew  regularly and had mountain dew today.  I discussed that we will observe her next week for testing. ---------------------------------------------------------------------- Recommendations  Continue weekly testing.  Discontinue mountain dew use. ----------------------------------------------------------------------               Lin Landsman, MD Electronically Signed Final Report   04/26/2019 07:08 am ----------------------------------------------------------------------  [place] MFM FETAL BPP WO NON STRESS  Result Date: 04/18/2019 ----------------------------------------------------------------------  OBSTETRICS REPORT                       (  Signed Final 04/18/2019 09:59 am) ---------------------------------------------------------------------- Patient Info  ID #:       [id number]                          D.O.B.:  1981-02-13 (38 yrs)  Name:       Natalie Chase                   Visit Date: 04/18/2019 09:33 am ---------------------------------------------------------------------- Performed By  Performed By:     Hurman Horn          Ref. Address:     [address]                                                             [place], [place]                                                             [address]  Attending:        Ma Rings MD         Location[facility]  Referred By:      Raelyn Mora CNM ---------------------------------------------------------------------- Orders   #  Description                          Code         Ordered By   1  [place] MFM FETAL BPP WO NON              76819.01     YU FANG      STRESS   2  [place] MFM OB FOLLOW UP                  60630.16     YU FANG  ----------------------------------------------------------------------   #  Order #                    Accession #                 Episode #  1   [id number]                  [id number]                  [id number]   2  [id number]                  [id number]                  [id number]  ---------------------------------------------------------------------- Indications   Hypertension - Chronic/Pre-existing            O10.019   (Procardia XL 60 mg qd, bASA)   Advanced maternal age multigravida 10+,        O88.523   third trimester   [redacted] weeks gestation of pregnancy                Z3A.36   Systemic lupus complicating pregnancy,         O26.893, M32.9   third trimester   Asthma (Albuterol)                             O99.89 j45.909   Genetic carrier (Sickle Cell Carrier)          Z14.8   Obesity complicating pregnancy, third          O99.213   trimester   Marijuana Use (stopped 10/02/18)   Advanced maternal age multigravida 93+,        O42.523   third trimester  ---------------------------------------------------------------------- Vital Signs                                                 Height:        5'4" ---------------------------------------------------------------------- Fetal Evaluation  Num Of Fetuses:         1  Fetal Heart Rate(bpm):  153  Cardiac Activity:       Observed  Presentation:           Cephalic  Placenta:               Anterior  P. Cord Insertion:      Visualized, central  Amniotic Fluid  AFI FV:      Within normal limits  AFI Sum(cm)     %Tile       Largest Pocket(cm)  12.39           40          4.21  RUQ(cm)       RLQ(cm)       LUQ(cm)        LLQ(cm)  4.21          1.67          3.19           3.32 ---------------------------------------------------------------------- Biophysical Evaluation  Amniotic F.V:   Within normal limits       F. Tone:        Observed  F. Movement:    Observed                   Score:          8/8  F. Breathing:   Observed ---------------------------------------------------------------------- Biometry  BPD:      91.2  mm     G. Age:  37w 0d  81  %    CI:        79.36   %    70 - 86                                                           FL/HC:      20.4   %    20.1 - 22.1  HC:      323.6  mm     G. Age:  36w 4d         30  %    HC/AC:      0.92        0.93 - 1.11  AC:      349.9  mm     G. Age:  38w 6d       > 99  %    FL/BPD:     72.4   %    71 - 87  FL:         66  mm     G. Age:  34w 0d          5  %    FL/AC:      18.9   %    20 - 24  HUM:      61.5  mm     G. Age:  35w 4d         54  %  Est. FW:    3176  gm           7 lb     82  % ---------------------------------------------------------------------- OB History  Gravidity:    3         Term:   1  TOP:          1        Living:  1 ---------------------------------------------------------------------- Gestational Age  LMP:           35w 3d        Date:  08/13/18                 EDD:   05/20/19  U/S Today:     36w 4d                                        EDD:   05/12/19  Best:          36w 1d     Det. By:  Marcella Dubs         EDD:   05/15/19                                      (10/28/18) ---------------------------------------------------------------------- Anatomy  Cranium:               Appears normal         Aortic Arch:            Appears normal  Cavum:                 Appears normal         Ductal Arch:  Previously seen  Ventricles:            Appears normal         Diaphragm:              Appears normal  Choroid Plexus:        Previously seen        Stomach:                Appears normal, left                                                                        sided  Cerebellum:            Previously seen        Abdomen:                Appears normal  Posterior Fossa:       Previously seen        Abdominal Wall:         Previously seen  Nuchal Fold:           Previously seen        Cord Vessels:           Previously seen  Face:                  Orbits and profile     Kidneys:                Appear normal                         previously seen  Lips:                  Previously seen        Bladder:                Appears normal  Thoracic:               Previously seen        Spine:                  Previously seen  Heart:                 Previously seen        Upper Extremities:      Previously seen  RVOT:                  Previously seen        Lower Extremities:      Previously seen  LVOT:                  Previously seen  Other:  Heels previously visualized.  Nasal bone visualized previously.          Technically difficult due to advanced GA and fetal position. ---------------------------------------------------------------------- Comments  This patient was seen for a follow up growth scan due to a  history of chronic hypertension that is currently treated with  Procardia.  She denies any problems since her last exam.  She was informed that the fetal growth and amniotic fluid  level appears appropriate for her gestational age.  A biophysical  profile performed today was 8 out of 8.  Another biophysical profile was scheduled in 1 week. ----------------------------------------------------------------------                   Ma Rings, MD Electronically Signed Final Report   04/18/2019 09:59 am ----------------------------------------------------------------------  [place] MFM FETAL BPP WO NON STRESS  Result Date: 04/11/2019 ----------------------------------------------------------------------  OBSTETRICS REPORT                       (Signed Final 04/11/2019 04:19 pm) ---------------------------------------------------------------------- Patient Info  ID #:       [id number]                          D.O.B.:  Aug 21, 1980 (38 yrs)  Name:       Natalie Chase                   Visit Date: 04/11/2019 03:26 pm ---------------------------------------------------------------------- Performed By  Performed By:     Ramond Craver Tester BS,       Ref. Address:     [address] [place]                                                             [address]                                                             [place], [place]                                                              [address]  Attending:        Ma Rings MD         [facility]  Referred By:      Raelyn Mora CNM ---------------------------------------------------------------------- Orders   #  Description                          Code         Ordered By   1  [place] MFM FETAL BPP WO NON              [phone]     YU FANG  STRESS  ----------------------------------------------------------------------   #  Order #                    Accession #                 Episode #   1  [id number]                  [id number]                  [id number]  ---------------------------------------------------------------------- Indications   Hypertension - Chronic/Pre-existing            O10.019   (Procardia XL 60 mg qd, bASA)   [redacted] weeks gestation of pregnancy                Z3A.3   Advanced maternal age multigravida 3+,        O24.523   third trimester   Systemic lupus complicating pregnancy,         O26.893, M32.9   third trimester   Asthma (Albuterol)                             O99.89 j45.909   Genetic carrier (Sickle Cell Carrier)          Z14.8   Obesity complicating pregnancy, third          O99.213   trimester   Marijuana Use (stopped 10/02/18)   Advanced maternal age multigravida 60+,        O63.523   third trimester  ---------------------------------------------------------------------- Vital Signs                                                 Height:        5'4" ---------------------------------------------------------------------- Fetal Evaluation  Num Of Fetuses:         1  Fetal Heart Rate(bpm):  148  Cardiac Activity:       Observed  Presentation:           Cephalic  Placenta:               Left lateral  P. Cord Insertion:      Previously Visualized  Amniotic Fluid  AFI FV:      Within normal limits  AFI Sum(cm)     %Tile       Largest Pocket(cm)  14.41           52          4.61  RUQ(cm)        RLQ(cm)       LUQ(cm)        LLQ(cm)  4.47          3.46          1.87           4.61 ---------------------------------------------------------------------- Biophysical Evaluation  Amniotic F.V:   Pocket => 2 cm             F. Tone:        Observed  F. Movement:    Observed                   Score:          8/8  F. Breathing:   Observed ---------------------------------------------------------------------- Biometry  LV:  3.3  mm ---------------------------------------------------------------------- OB History  Gravidity:    3         Term:   1  TOP:          1        Living:  1 ---------------------------------------------------------------------- Gestational Age  LMP:           34w 3d        Date:  08/13/18                 EDD:   05/20/19  Best:          35w 1d     Det. ByMarcella Dubs         EDD:   05/15/19                                      (10/28/18) ---------------------------------------------------------------------- Anatomy  Cranium:               Previously seen        Aortic Arch:            Previously seen  Cavum:                 Previously seen        Ductal Arch:            Previously seen  Ventricles:            Appears normal         Diaphragm:              Previously seen  Choroid Plexus:        Previously seen        Stomach:                Appears normal, left                                                                        sided  Cerebellum:            Previously seen        Abdomen:                Previously seen  Posterior Fossa:       Previously seen        Abdominal Wall:         Previously seen  Nuchal Fold:           Previously seen        Cord Vessels:           Previously seen  Face:                  Orbits and profile     Kidneys:                Appear normal                         previously seen  Lips:                  Previously seen  Bladder:                Appears normal  Thoracic:              Previously seen        Spine:                  Previously seen   Heart:                 Appears normal         Upper Extremities:      Previously seen                         (4CH, axis, and                         situs)  RVOT:                  Previously seen        Lower Extremities:      Previously seen  LVOT:                  Previously seen  Other:  Heels previously visualized.  Nasal bone visualized previously.          Technically difficult due to advanced GA and fetal position. ---------------------------------------------------------------------- Cervix Uterus Adnexa  Cervix  Not visualized (advanced GA >24wks)  Uterus  No abnormality visualized.  Left Ovary  Within normal limits. No adnexal mass visualized.  Right Ovary  Within normal limits. No adnexal mass visualized.  Cul De Sac  No free fluid seen.  Adnexa  No abnormality visualized. ---------------------------------------------------------------------- Comments  This patient was seen for a biophysical profile due to a history  of chronic hypertension that is currently treated with  nifedipine.  She denies any problems since her last exam.  A biophysical profile performed today was 8 out of 8.  There was normal amniotic fluid noted on today's ultrasound  exam.  Another biophysical profile and growth ultrasound was  scheduled in 1 week. ----------------------------------------------------------------------                   Ma Rings, MD Electronically Signed Final Report   04/11/2019 04:19 pm ----------------------------------------------------------------------  [place] MFM OB FOLLOW UP  Result Date: 04/18/2019 ----------------------------------------------------------------------  OBSTETRICS REPORT                       (Signed Final 04/18/2019 09:59 am) ---------------------------------------------------------------------- Patient Info  ID #:       [id number]                          D.O.B.:  [birth year] (38 yrs)  Name:       Natalie Chase                   Visit Date: 04/18/2019 09:33 am  ---------------------------------------------------------------------- Performed By  Performed By:     Hurman Horn          Ref. Address:     [address]                    [facility]  [place], [place]  Attending:        Johnell Comings MD         [facility]  Referred By:      Laury Deep CNM ---------------------------------------------------------------------- Orders   #  Description                          Code         Ordered By   1  [place] MFM FETAL BPP WO NON              76819.01     YU FANG      STRESS   2  [place] MFM OB FOLLOW UP                  76816.01     YU FANG  ----------------------------------------------------------------------   #  Order #                    Accession #                 Episode #   1  [id number]                  [id number]   2  [id number]                  [id number]                  [id number]  ---------------------------------------------------------------------- Indications   Hypertension - Chronic/Pre-existing            O10.019   (Procardia XL 60 mg qd, bASA)   Advanced maternal age multigravida 20+,        O28.523   third trimester   [redacted] weeks gestation of pregnancy                Z3A.36   Systemic lupus complicating pregnancy,         O26.893, M32.9   third trimester   Asthma (Albuterol)  O99.89 j45.909   Genetic carrier (Sickle Cell Carrier)          Z14.8   Obesity complicating pregnancy, third          O99.213   trimester   Marijuana Use (stopped 10/02/18)   Advanced maternal age multigravida 48+,        O47.523   third trimester  ---------------------------------------------------------------------- Vital Signs                                                  Height:        5'4" ---------------------------------------------------------------------- Fetal Evaluation  Num Of Fetuses:         1  Fetal Heart Rate(bpm):  153  Cardiac Activity:       Observed  Presentation:           Cephalic  Placenta:               Anterior  P. Cord Insertion:      Visualized, central  Amniotic Fluid  AFI FV:      Within normal limits  AFI Sum(cm)     %Tile       Largest Pocket(cm)  12.39           40          4.21  RUQ(cm)       RLQ(cm)       LUQ(cm)        LLQ(cm)  4.21          1.67          3.19           3.32 ---------------------------------------------------------------------- Biophysical Evaluation  Amniotic F.V:   Within normal limits       F. Tone:        Observed  F. Movement:    Observed                   Score:          8/8  F. Breathing:   Observed ---------------------------------------------------------------------- Biometry  BPD:      91.2  mm     G. Age:  37w 0d         81  %    CI:        79.36   %    70 - 86                                                          FL/HC:      20.4   %    20.1 - 22.1  HC:      323.6  mm     G. Age:  36w 4d         30  %    HC/AC:      0.92        0.93 - 1.11  AC:      349.9  mm     G. Age:  38w 6d       > 99  %    FL/BPD:     72.4   %    71 - 87  FL:         66  mm     G. Age:  34w 0d          5  %    FL/AC:      18.9   %    20 - 24  HUM:      61.5  mm     G. Age:  35w 4d         54  %  Est. FW:    3176  gm           7 lb     82  % ---------------------------------------------------------------------- OB History  Gravidity:    3         Term:   1  TOP:          1        Living:  1 ---------------------------------------------------------------------- Gestational Age  LMP:           35w 3d        Date:  08/13/18                 EDD:   05/20/19  U/S Today:     36w 4d                                        EDD:   05/12/19  Best:          36w 1d     Det. ByMarcella Dubs         EDD:   05/15/19                                       (10/28/18) ---------------------------------------------------------------------- Anatomy  Cranium:               Appears normal         Aortic Arch:            Appears normal  Cavum:                 Appears normal         Ductal Arch:            Previously seen  Ventricles:            Appears normal         Diaphragm:              Appears normal  Choroid Plexus:        Previously seen        Stomach:                Appears normal, left                                                                        sided  Cerebellum:            Previously seen        Abdomen:                Appears normal  Posterior Fossa:       Previously seen        Abdominal Wall:         Previously seen  Nuchal Fold:           Previously seen        Cord Vessels:           Previously seen  Face:                  Orbits and profile     Kidneys:                Appear normal                         previously seen  Lips:                  Previously seen        Bladder:                Appears normal  Thoracic:              Previously seen        Spine:                  Previously seen  Heart:                 Previously seen        Upper Extremities:      Previously seen  RVOT:                  Previously seen        Lower Extremities:      Previously seen  LVOT:                  Previously seen  Other:  Heels previously visualized.  Nasal bone visualized previously.          Technically difficult due to advanced GA and fetal position. ---------------------------------------------------------------------- Comments  This patient was seen for a follow up growth scan due to a  history of chronic hypertension that is currently treated with  Procardia.  She denies any problems since her last exam.  She was informed that the fetal growth and amniotic fluid  level appears appropriate for her gestational age.  A biophysical profile performed today was 8 out of 8.  Another biophysical profile was scheduled in 1 week.  ----------------------------------------------------------------------                   Ma Rings, MD Electronically Signed Final Report   04/18/2019 09:59 am ----------------------------------------------------------------------   Assessment and Plan:  Pregnancy: G3P1011 at [redacted]w[redacted]d 1. Chronic hypertension affecting pregnancy Stable BP on Procardia XL 60 mg.  Continue antenatal testing and scans as per MFM.  IOL already scheduled on 05/10/19.   2. Systemic lupus erythematosus affecting pregnancy in third trimester (HCC) Stable on Plaquenil.   3. Supervision of high risk pregnancy, antepartum No other concerns. Expressed sympathy that her doula would not be able to assist with her delivery as planned due to COVID restrictions.  Labor symptoms and general obstetric precautions including but not limited to vaginal bleeding, contractions, leaking of fluid and fetal movement were reviewed in detail with the patient.  I discussed the assessment and treatment plan with the patient. The patient was provided an opportunity to ask questions and all were answered. The patient agreed with the plan and  demonstrated an understanding of the instructions. The patient was advised to call back or seek an in-person office evaluation/[facility] for any urgent or concerning symptoms. Please refer to After Visit Summary for other counseling recommendations.   I provided 7 minutes of non-face-to-face time during this encounter.  Return in about 2 weeks (around 05/20/2019) for Postpartum BP Check.  Future Appointments  Date Time Provider Department Center  05/08/2019  9:30 AM MC-SCREENING MC-SDSC None  05/09/2019 10:00 AM WH-MFC NURSE WH-MFC MFC-US  05/09/2019 10:00 AM WH-MFC US 3 WH-MFCUS MFC-US  05/10/2019  7:00 AM MC-LD SCHED ROOM MC-INDC None    Jaynie CollinsUgonna Nekoda Chock, MD Center for [facility], [facility]

## 2019-05-08 ENCOUNTER — Ambulatory Visit (HOSPITAL_COMMUNITY)
Admission: RE | Admit: 2019-05-08 | Discharge: 2019-05-08 | Disposition: A | Discharge status: Home / Self Care | Payer: Medicaid Other | Source: Ambulatory Visit | Attending: Obstetrics | Admitting: Obstetrics

## 2019-05-08 ENCOUNTER — Encounter (HOSPITAL_COMMUNITY): Payer: Self-pay

## 2019-05-08 ENCOUNTER — Other Ambulatory Visit (HOSPITAL_COMMUNITY)
Admission: RE | Admit: 2019-05-08 | Discharge: 2019-05-08 | Disposition: A | Discharge status: Home / Self Care | Payer: Medicaid Other | Source: Ambulatory Visit | Attending: Family Medicine | Admitting: Family Medicine

## 2019-05-08 ENCOUNTER — Other Ambulatory Visit: Payer: Self-pay

## 2019-05-08 ENCOUNTER — Ambulatory Visit (HOSPITAL_COMMUNITY): Payer: Medicaid Other | Admitting: *Deleted

## 2019-05-08 DIAGNOSIS — O99213 Obesity complicating pregnancy, third trimester: Secondary | ICD-10-CM

## 2019-05-08 DIAGNOSIS — O10913 Unspecified pre-existing hypertension complicating pregnancy, third trimester: Secondary | ICD-10-CM | POA: Diagnosis present

## 2019-05-08 DIAGNOSIS — O10919 Unspecified pre-existing hypertension complicating pregnancy, unspecified trimester: Secondary | ICD-10-CM | POA: Insufficient documentation

## 2019-05-08 DIAGNOSIS — M329 Systemic lupus erythematosus, unspecified: Secondary | ICD-10-CM

## 2019-05-08 DIAGNOSIS — O09523 Supervision of elderly multigravida, third trimester: Secondary | ICD-10-CM

## 2019-05-08 DIAGNOSIS — O26893 Other specified pregnancy related conditions, third trimester: Secondary | ICD-10-CM | POA: Diagnosis not present

## 2019-05-08 DIAGNOSIS — D573 Sickle-cell trait: Secondary | ICD-10-CM | POA: Diagnosis present

## 2019-05-08 DIAGNOSIS — O99891 Other specified diseases and conditions complicating pregnancy: Secondary | ICD-10-CM

## 2019-05-08 DIAGNOSIS — Z148 Genetic carrier of other disease: Secondary | ICD-10-CM

## 2019-05-08 DIAGNOSIS — Z3A39 39 weeks gestation of pregnancy: Secondary | ICD-10-CM

## 2019-05-08 DIAGNOSIS — O10019 Pre-existing essential hypertension complicating pregnancy, unspecified trimester: Secondary | ICD-10-CM

## 2019-05-08 DIAGNOSIS — O0990 Supervision of high risk pregnancy, unspecified, unspecified trimester: Secondary | ICD-10-CM | POA: Insufficient documentation

## 2019-05-08 DIAGNOSIS — Z20822 Contact with and (suspected) exposure to covid-19: Secondary | ICD-10-CM | POA: Insufficient documentation

## 2019-05-08 DIAGNOSIS — O99019 Anemia complicating pregnancy, unspecified trimester: Secondary | ICD-10-CM | POA: Insufficient documentation

## 2019-05-08 DIAGNOSIS — J45909 Unspecified asthma, uncomplicated: Secondary | ICD-10-CM

## 2019-05-08 LAB — SARS CORONAVIRUS 2 (TAT 6-24 HRS): SARS Coronavirus 2: NEGATIVE

## 2019-05-09 ENCOUNTER — Ambulatory Visit (HOSPITAL_COMMUNITY): Payer: Medicaid Other

## 2019-05-09 ENCOUNTER — Ambulatory Visit (HOSPITAL_COMMUNITY): Admission: RE | Admit: 2019-05-09 | Payer: Medicaid Other | Source: Ambulatory Visit

## 2019-05-10 ENCOUNTER — Other Ambulatory Visit: Payer: Self-pay

## 2019-05-10 ENCOUNTER — Inpatient Hospital Stay (HOSPITAL_COMMUNITY)
Admission: AD | Admit: 2019-05-10 | Payer: Medicaid Other | Source: Home / Self Care | Admitting: Obstetrics and Gynecology

## 2019-05-10 ENCOUNTER — Inpatient Hospital Stay (HOSPITAL_COMMUNITY)
Admission: AD | Admit: 2019-05-10 | Discharge: 2019-05-13 | DRG: 805 | Disposition: A | Discharge status: Home / Self Care | Payer: Medicaid Other | Attending: Obstetrics and Gynecology | Admitting: Obstetrics and Gynecology

## 2019-05-10 ENCOUNTER — Encounter (HOSPITAL_COMMUNITY): Payer: Self-pay | Admitting: Obstetrics and Gynecology

## 2019-05-10 ENCOUNTER — Inpatient Hospital Stay (HOSPITAL_COMMUNITY): Payer: Medicaid Other

## 2019-05-10 DIAGNOSIS — Z87891 Personal history of nicotine dependence: Secondary | ICD-10-CM

## 2019-05-10 DIAGNOSIS — O10913 Unspecified pre-existing hypertension complicating pregnancy, third trimester: Secondary | ICD-10-CM | POA: Diagnosis not present

## 2019-05-10 DIAGNOSIS — D573 Sickle-cell trait: Secondary | ICD-10-CM | POA: Diagnosis present

## 2019-05-10 DIAGNOSIS — O99019 Anemia complicating pregnancy, unspecified trimester: Secondary | ICD-10-CM | POA: Diagnosis present

## 2019-05-10 DIAGNOSIS — O411230 Chorioamnionitis, third trimester, not applicable or unspecified: Secondary | ICD-10-CM | POA: Diagnosis present

## 2019-05-10 DIAGNOSIS — O9952 Diseases of the respiratory system complicating childbirth: Secondary | ICD-10-CM | POA: Diagnosis present

## 2019-05-10 DIAGNOSIS — Z3A39 39 weeks gestation of pregnancy: Secondary | ICD-10-CM

## 2019-05-10 DIAGNOSIS — O1002 Pre-existing essential hypertension complicating childbirth: Secondary | ICD-10-CM | POA: Diagnosis present

## 2019-05-10 DIAGNOSIS — O10919 Unspecified pre-existing hypertension complicating pregnancy, unspecified trimester: Secondary | ICD-10-CM | POA: Diagnosis present

## 2019-05-10 DIAGNOSIS — O99892 Other specified diseases and conditions complicating childbirth: Secondary | ICD-10-CM | POA: Diagnosis present

## 2019-05-10 DIAGNOSIS — M329 Systemic lupus erythematosus, unspecified: Secondary | ICD-10-CM | POA: Diagnosis present

## 2019-05-10 DIAGNOSIS — J45909 Unspecified asthma, uncomplicated: Secondary | ICD-10-CM | POA: Diagnosis present

## 2019-05-10 DIAGNOSIS — Z3490 Encounter for supervision of normal pregnancy, unspecified, unspecified trimester: Secondary | ICD-10-CM | POA: Diagnosis present

## 2019-05-10 DIAGNOSIS — O9902 Anemia complicating childbirth: Secondary | ICD-10-CM | POA: Diagnosis present

## 2019-05-10 LAB — CBC
HCT: 36.8 % (ref 36.0–46.0)
Hemoglobin: 12 g/dL (ref 12.0–15.0)
MCH: 27.1 pg (ref 26.0–34.0)
MCHC: 32.6 g/dL (ref 30.0–36.0)
MCV: 83.1 fL (ref 80.0–100.0)
Platelets: 290 10*3/uL (ref 150–400)
RBC: 4.43 MIL/uL (ref 3.87–5.11)
RDW: 17.4 % — ABNORMAL HIGH (ref 11.5–15.5)
WBC: 14.3 10*3/uL — ABNORMAL HIGH (ref 4.0–10.5)
nRBC: 0 % (ref 0.0–0.2)

## 2019-05-10 LAB — TYPE AND SCREEN
ABO/RH(D): O POS
Antibody Screen: NEGATIVE

## 2019-05-10 LAB — RPR: RPR Ser Ql: NONREACTIVE

## 2019-05-10 LAB — ABO/RH: ABO/RH(D): O POS

## 2019-05-10 MED ORDER — NIFEDIPINE ER OSMOTIC RELEASE 30 MG PO TB24
60.0000 mg | ORAL_TABLET | Freq: Every day | ORAL | Status: DC
  Administered 2019-05-10 – 2019-05-12 (×3): 60 mg via ORAL
  Filled 2019-05-10: qty 1
  Filled 2019-05-10 (×2): qty 2
  Filled 2019-05-10: qty 1

## 2019-05-10 MED ORDER — SOD CITRATE-CITRIC ACID 500-334 MG/5ML PO SOLN: 30.0000 mL | ORAL | Status: DC | PRN

## 2019-05-10 MED ORDER — LACTATED RINGERS IV SOLN: 500.0000 mL | INTRAVENOUS | Status: DC | PRN

## 2019-05-10 MED ORDER — ACETAMINOPHEN 325 MG PO TABS
650.0000 mg | ORAL_TABLET | ORAL | Status: DC | PRN
  Administered 2019-05-11: 650 mg via ORAL
  Filled 2019-05-10: qty 2

## 2019-05-10 MED ORDER — OXYTOCIN 40 UNITS IN NORMAL SALINE INFUSION - SIMPLE MED
2.5000 [IU]/h | INTRAVENOUS | Status: DC
  Administered 2019-05-11: 2.5 [IU]/h via INTRAVENOUS
  Filled 2019-05-10: qty 1000

## 2019-05-10 MED ORDER — ONDANSETRON HCL 4 MG/2ML IJ SOLN: 4.0000 mg | Freq: Four times a day (QID) | INTRAMUSCULAR | Status: DC | PRN

## 2019-05-10 MED ORDER — LACTATED RINGERS IV SOLN: INTRAVENOUS | Status: DC

## 2019-05-10 MED ORDER — OXYTOCIN BOLUS FROM INFUSION
500.0000 mL | Freq: Once | INTRAVENOUS | Status: AC
  Administered 2019-05-11: 500 mL via INTRAVENOUS

## 2019-05-10 MED ORDER — FENTANYL CITRATE (PF) 100 MCG/2ML IJ SOLN
100.0000 ug | INTRAMUSCULAR | Status: DC | PRN
  Administered 2019-05-10 – 2019-05-11 (×2): 100 ug via INTRAVENOUS
  Filled 2019-05-10 (×2): qty 2

## 2019-05-10 MED ORDER — OXYCODONE-ACETAMINOPHEN 5-325 MG PO TABS: 2.0000 | ORAL_TABLET | ORAL | Status: DC | PRN

## 2019-05-10 MED ORDER — TERBUTALINE SULFATE 1 MG/ML IJ SOLN: 0.2500 mg | Freq: Once | INTRAMUSCULAR | Status: DC | PRN

## 2019-05-10 MED ORDER — LIDOCAINE HCL (PF) 1 % IJ SOLN
30.0000 mL | INTRAMUSCULAR | Status: AC | PRN
  Administered 2019-05-11: 30 mL via SUBCUTANEOUS
  Filled 2019-05-10: qty 30

## 2019-05-10 MED ORDER — OXYCODONE-ACETAMINOPHEN 5-325 MG PO TABS: 1.0000 | ORAL_TABLET | ORAL | Status: DC | PRN

## 2019-05-10 MED ORDER — HYDROXYCHLOROQUINE SULFATE 200 MG PO TABS
200.0000 mg | ORAL_TABLET | Freq: Every day | ORAL | Status: DC
  Administered 2019-05-10 – 2019-05-12 (×3): 200 mg via ORAL
  Filled 2019-05-10 (×5): qty 1

## 2019-05-10 MED ORDER — MISOPROSTOL 50MCG HALF TABLET
50.0000 ug | ORAL_TABLET | ORAL | Status: DC | PRN
  Administered 2019-05-10 – 2019-05-11 (×7): 50 ug via BUCCAL
  Filled 2019-05-10 (×7): qty 1

## 2019-05-10 NOTE — Progress Notes (Signed)
Labor Progress Note Parminder Trapani is a 39 y.o. G3P1011 at [redacted]w[redacted]d presented for IOL for CHTN  S:  Patient comfortable  O:  BP 131/72   Pulse 96   Temp 98.5 F (36.9 C) (Oral)   Resp 16   Ht 5' 4.5" (1.638 m)   Wt 99 kg   LMP 08/13/2018 (Approximate)   BMI 36.88 kg/m   Fetal Tracing:  Baseline: 145 Variability: moderate Accels: 15x15 Decels: none  Toco: occasional uc's   CVE: Dilation: Fingertip Effacement (%): Thick Station: -3 Presentation: Vertex Exam by:: Lajuana Matte, RNC   A&P: 39 y.o. G3P1011 [redacted]w[redacted]d IOL CHTN #Labor: Continue cytotec. Patient declines further attempts at [facility]. #Pain: per patient request #FWB: Cat 1 #GBS negative  Rolm Bookbinder, [place] 05/10/19

## 2019-05-10 NOTE — Progress Notes (Signed)
Labor Progress Note Natalie Chase is a 39 y.o. G3P1011 at [redacted]w[redacted]d presented for IOL for CHTN  S:  Overall comfortable. Feeling occasional ctx.   O:  BP 130/67   Pulse 100   Temp 99.3 F (37.4 C) (Oral)   Resp 17   Ht 5' 4.5" (1.638 m)   Wt 99 kg   LMP 08/13/2018 (Approximate)   BMI 36.88 kg/m   Fetal Tracing:  Baseline: 145 Variability: moderate Accels: 15x15 Decels: none  Toco: irregular    CVE: Dilation: Closed Effacement (%): 50 Station: Ballotable Presentation: Vertex Exam by:: Lean Fayson md   A&P: 39 y.o. G3P1011 [redacted]w[redacted]d IOL CHTN. #Labor: Cervix unchanged. Continue cytotec. Patient declines foley bulb. Anticipate eventual SVD. #Pain: per patient request #FWB: Cat I #GBS negative  #cHTN: cont Procardia; BP's normal range currently   [name], MD 9:08 PM

## 2019-05-10 NOTE — Progress Notes (Signed)
Labor Progress Note Zaliah Wissner is a 39 y.o. G3P1011 at [redacted]w[redacted]d presented for IOL for CHTN  S:  Patient comfortable. Reports intermittent cramping.   O:  BP (!) 125/56   Pulse (!) 108   Temp 98.5 F (36.9 C) (Oral)   Resp 16   Ht 5' 4.5" (1.638 m)   Wt 99 kg   LMP 08/13/2018 (Approximate)   BMI 36.88 kg/m   Fetal Tracing:  Baseline: 145 Variability: moderate Accels: 15x15 Decels: none  Toco: occasional uc's   CVE: Dilation: Fingertip Effacement (%): Thick Station: -3 Presentation: Vertex Exam by:: Lajuana Matte, RNC   A&P: 39 y.o. G3P1011 [redacted]w[redacted]d IOL CHTN #Labor: Cervix unchanged. Continue cytotec. Vertex confirmed by bedside u/s.  #Pain: per patient request #FWB: Cat 1 #GBS negative  Rolm Bookbinder, CNM 3:11 PM

## 2019-05-10 NOTE — H&P (Addendum)
OBSTETRIC ADMISSION HISTORY AND PHYSICAL  Natalie Chase is a 39 y.o. female G5P1011 with IUP at 35w2dby [place] for IOL due to chronic HTN. She reports +FMs, No LOF, no VB, no blurry vision, headaches or peripheral edema, and RUQ pain.  She plans on breastfeeding. She request OCP for birth control. [facility] [place] [facility] [place]  Estimated Date of Delivery: 05/15/19  Sono:    @[redacted]w[redacted]d , CWD, normal anatomy, cephalic presentation, anterior placenta, 3176g, 82% EFW   Prenatal History/Complications: Chronic HTN - stable BP on Procardia XL 10m31mckle cell trait  SLE - Stable on Plaquenil  Past Medical History: Past Medical History:  Diagnosis Date  . Asthma   . Hypertension   . Lupus (systemic lupus erythematosus) (HCC)[place]. Sickle cell trait (HCC)[place]. Vaginal Pap smear, abnormal     Past Surgical History: Past Surgical History:  Procedure Laterality Date  . CHOLECYSTECTOMY      Obstetrical History: OB History    Gravida  3   Para  1   Term  1   Preterm      AB  1   Living  1     SAB      TAB  1   Ectopic      Multiple      Live Births  1           Social History Social History   Socioeconomic History  . Marital status: Single    Spouse name: Not on file  . Number of children: 1  . Years of education: HighWestern & Southern FinancialHighest education level: Some college, no degree  Occupational History  . Not on file  Tobacco Use  . Smoking status: Former Smoker    Packs/day: 0.00    Types: Cigarettes    Quit date: 01/09/2017    Years since quitting: 2.3  . Smokeless tobacco: Former UserNetwork engineer Sexual Activity  . Alcohol use: Not Currently  . Drug use: Not Currently    Types: Marijuana    Comment: Stopped August 5th, 2020  . Sexual activity: Yes    Birth control/protection: None  Other Topics Concern  . Not on file  Social History Narrative  . Not on file   Social Determinants of Health    Financial Resource Strain: Medium Risk  . Difficulty of Paying Living Expenses: Somewhat hard  Food Insecurity: No Food Insecurity  . Worried About RunnCharity fundraiserthe Last Year: Never true  . Ran Out of Food in the Last Year: Never true  Transportation Needs: No Transportation Needs  . Lack of Transportation (Medical): No  . Lack of Transportation (Non-Medical): No  Physical Activity: Sufficiently Active  . Days of Exercise per Week: 3 days  . Minutes of Exercise per Session: 60 min  Stress: Stress Concern Present  . Feeling of Stress : To some extent  Social Connections: Somewhat Isolated  . Frequency of Communication with Friends and Family: More than three times a week  . Frequency of Social Gatherings with Friends and Family: More than three times a week  . Attends Religious Services: 1 to 4 times per year  . Active Member of Clubs or Organizations: No  . Attends ClubArchivisttings: Never  . Marital Status: Never married    Family History: Family History  Problem Relation Age of Onset  . Achondroplasia Mother   .  Lupus Mother   . Sickle cell anemia Mother   . Hypertension Father   . Stroke Father   . Cancer Other   . Heart disease Maternal Grandmother     Allergies: No Known Allergies  Medications Prior to Admission  Medication Sig Dispense Refill Last Dose  . aspirin 81 MG chewable tablet Chew 1 tablet (81 mg total) by mouth daily. 30 tablet 6 05/09/2019 at Unknown time  . Cholecalciferol (VITAMIN D3) 50 MCG (2000 UT) TABS Take 1 capsule by mouth daily.   05/09/2019 at Unknown time  . cyclobenzaprine (FLEXERIL) 10 MG tablet Take 1 tablet (10 mg total) by mouth every 8 (eight) hours as needed for muscle spasms. 30 tablet 1 Past Month at Unknown time  . hydroxychloroquine (PLAQUENIL) 200 MG tablet Take by mouth daily.   05/09/2019 at Unknown time  . NIFEdipine (ADALAT CC) 60 MG 24 hr tablet Take 1 tablet by mouth once daily 30 tablet 0 05/09/2019 at  Unknown time  . Prenatal Vit-Fe Fumarate-FA (NIVA-PLUS) 27-1 MG TABS Take 1 tablet by mouth once daily 30 tablet 0 05/09/2019 at Unknown time  . Accu-Chek FastClix Lancets MISC 1 Device by Percutaneous route 4 (four) times daily. (Patient not taking: Reported on 04/18/2019) 100 each 3   . albuterol (PROVENTIL HFA;VENTOLIN HFA) 108 (90 Base) MCG/ACT inhaler Inhale 1-2 puffs into the lungs every 6 (six) hours as needed for wheezing or shortness of breath. 1 Inhaler 0 More than a month at Unknown time  . Blood Glucose Monitoring Suppl (ACCU-CHEK GUIDE ME) w/Device KIT 1 kit by Does not apply route QID. 1 kit 0   . Elastic Bandages & Supports (COMFORT FIT MATERNITY SUPP LG) MISC 1 Units by Does not apply route daily. 1 each 0   . glucose blood (ACCU-CHEK GUIDE) test strip Use as instructed (Patient not taking: Reported on 04/18/2019) 100 each 12   . Iron-Vitamin C 65-125 MG TABS Take 1 tablet by mouth every other day. 1 tablet on Monday, Wednesday, Friday    at not taking     Review of Systems   All systems reviewed and negative except as stated in HPI  Blood pressure 123/72, pulse 92, temperature 99.5 F (37.5 C), temperature source Oral, resp. rate 20, height 5' 4.5" (1.638 m), weight 99 kg, last menstrual period 08/13/2018. General appearance: alert, cooperative and no distress Lungs: clear to auscultation bilaterally Heart: regular rate and rhythm Abdomen: soft, non-tender; bowel sounds normal Extremities: Homans sign is negative, no sign of DVT Presentation: cephalic Fetal monitoringBaseline: 135 bpm, Variability: Good {> 6 bpm), Accelerations: Reactive and Decelerations: Absent Uterine activity Infrequent contractions Dilation: Fingertip Effacement (%): Thick Exam by:: Wende Bushy CNM   Both Dr. Philmore Pali and Darrol Poke attempted FB placement and were unsuccessful.    Prenatal labs: ABO, Rh: O/Positive/-- (09/17 0902) Antibody: Negative (09/17 0902) Rubella: 11.90 (09/17 0902) RPR:  Non Reactive (09/17 0902)  HBsAg: Negative (09/17 0902)  HIV: Non Reactive (09/17 0902)  GBS: --Henderson Cloud (02/17 1040)  Third Trimester A1C 5.3% Genetic screening -Sickle Cell Trait; NIPS low risk Anatomy US normal  Prenatal Transfer Tool  Maternal Diabetes: No Genetic Screening: Abnormal:  Results: Other: Sickle Cell Trait Maternal Ultrasounds/Referrals: Normal Fetal Ultrasounds or other Referrals:  None Maternal Substance Abuse:  No Significant Maternal Medications:  Meds include: Other:  Plaquenil Significant Maternal Lab Results: Group B Strep negative  No results found for this or any previous visit (from the past 24 hour(s)).  Patient Active Problem  List   Diagnosis Date Noted  . Lupus (systemic lupus erythematosus) ([place])   . Sickle cell trait in mother affecting pregnancy ([place]) 11/14/2018  . Supervision of high risk pregnancy, antepartum 10/29/2018  . Chronic hypertension affecting pregnancy 10/29/2018  . Candida rash of groin 08/08/2018  . Hypertension 06/11/2017    Assessment/Plan:  Natalie Chase is a 39 y.o. G3P1011 at 19w2dhere for IOL for chronic HTN.   #Labor: Start buccal cytotec. Attempted FB placement but was unsuccessful. Patient would not like to try FB placement again.  #Pain: Would like to try to labor without pain medicines. Unsure if she would want IV pain meds or epidural if she decides she wants something.  #FWB: Cat 1 #ID:  GBS negative #MOF: Breast #MOC: OCP #Circ:  Yes (outpatient)  KPaulla Dolly MD  05/10/2019, 4:25 AM   I saw and evaluated the patient. I agree with the findings and the plan of care as documented in the resident's note. Vertex by RN exam. S/p Cytotec x1. Two Foley bulb attempts in MAU by CNM and resident; patient would like to avoid another attempt if possible. EFW 3600g. Cont home Procardia and Plaquenil. Anticipate SVD.   CBarrington Ellison MD [facility], The[facility] Medicine Fellow, [facility], [place] [facility] [place]

## 2019-05-10 NOTE — MAU Note (Signed)
PT SAYS UC  FEELS LIKE PRESSURE- BUT UC NOT STRONG OR CONSISTENT .  PNC WITH  FAMINA- VE - CLOSED AT 36 WEEKS  DENIES HSV AND MRSA .  GBS- NEG

## 2019-05-11 ENCOUNTER — Inpatient Hospital Stay (HOSPITAL_COMMUNITY): Payer: Medicaid Other | Admitting: Anesthesiology

## 2019-05-11 ENCOUNTER — Encounter (HOSPITAL_COMMUNITY): Payer: Self-pay | Admitting: Obstetrics and Gynecology

## 2019-05-11 DIAGNOSIS — Z3A39 39 weeks gestation of pregnancy: Secondary | ICD-10-CM

## 2019-05-11 DIAGNOSIS — O10913 Unspecified pre-existing hypertension complicating pregnancy, third trimester: Secondary | ICD-10-CM

## 2019-05-11 LAB — CBC
HCT: 37.3 % (ref 36.0–46.0)
HCT: 32.4 % — ABNORMAL LOW (ref 36.0–46.0)
Hemoglobin: 12.3 g/dL (ref 12.0–15.0)
Hemoglobin: 10.6 g/dL — ABNORMAL LOW (ref 12.0–15.0)
MCH: 26.5 pg (ref 26.0–34.0)
MCH: 26.4 pg (ref 26.0–34.0)
MCHC: 33 g/dL (ref 30.0–36.0)
MCHC: 32.7 g/dL (ref 30.0–36.0)
MCV: 80.2 fL (ref 80.0–100.0)
MCV: 80.6 fL (ref 80.0–100.0)
Platelets: 287 10*3/uL (ref 150–400)
Platelets: 273 10*3/uL (ref 150–400)
RBC: 4.65 MIL/uL (ref 3.87–5.11)
RBC: 4.02 MIL/uL (ref 3.87–5.11)
RDW: 17.2 % — ABNORMAL HIGH (ref 11.5–15.5)
RDW: 17.1 % — ABNORMAL HIGH (ref 11.5–15.5)
WBC: 16.3 10*3/uL — ABNORMAL HIGH (ref 4.0–10.5)
WBC: 21.6 10*3/uL — ABNORMAL HIGH (ref 4.0–10.5)
nRBC: 0 % (ref 0.0–0.2)
nRBC: 0 % (ref 0.0–0.2)

## 2019-05-11 MED ORDER — SENNOSIDES-DOCUSATE SODIUM 8.6-50 MG PO TABS
2.0000 | ORAL_TABLET | ORAL | Status: DC
  Administered 2019-05-12 (×2): 2 via ORAL
  Filled 2019-05-11 (×2): qty 2

## 2019-05-11 MED ORDER — ZOLPIDEM TARTRATE 5 MG PO TABS: 5.0000 mg | ORAL_TABLET | Freq: Every evening | ORAL | Status: DC | PRN

## 2019-05-11 MED ORDER — PHENYLEPHRINE 40 MCG/ML (10ML) SYRINGE FOR IV PUSH (FOR BLOOD PRESSURE SUPPORT): 80.0000 ug | PREFILLED_SYRINGE | INTRAVENOUS | Status: DC | PRN

## 2019-05-11 MED ORDER — ONDANSETRON HCL 4 MG/2ML IJ SOLN: 4.0000 mg | INTRAMUSCULAR | Status: DC | PRN

## 2019-05-11 MED ORDER — DIPHENHYDRAMINE HCL 50 MG/ML IJ SOLN: 12.5000 mg | INTRAMUSCULAR | Status: DC | PRN

## 2019-05-11 MED ORDER — DIPHENHYDRAMINE HCL 25 MG PO CAPS: 25.0000 mg | ORAL_CAPSULE | Freq: Four times a day (QID) | ORAL | Status: DC | PRN

## 2019-05-11 MED ORDER — LIDOCAINE HCL (PF) 1 % IJ SOLN
INTRAMUSCULAR | Status: DC | PRN
  Administered 2019-05-11 (×2): 4 mL via EPIDURAL

## 2019-05-11 MED ORDER — GENTAMICIN SULFATE 40 MG/ML IJ SOLN
5.0000 mg/kg | INTRAVENOUS | Status: DC
  Administered 2019-05-11: 370 mg via INTRAVENOUS
  Filled 2019-05-11: qty 9.25

## 2019-05-11 MED ORDER — SIMETHICONE 80 MG PO CHEW: 80.0000 mg | CHEWABLE_TABLET | ORAL | Status: DC | PRN

## 2019-05-11 MED ORDER — PRENATAL MULTIVITAMIN CH
1.0000 | ORAL_TABLET | Freq: Every day | ORAL | Status: DC
  Administered 2019-05-12 – 2019-05-13 (×2): 1 via ORAL
  Filled 2019-05-11 (×2): qty 1

## 2019-05-11 MED ORDER — ONDANSETRON HCL 4 MG PO TABS: 4.0000 mg | ORAL_TABLET | ORAL | Status: DC | PRN

## 2019-05-11 MED ORDER — EPHEDRINE 5 MG/ML INJ: 10.0000 mg | INTRAVENOUS | Status: DC | PRN

## 2019-05-11 MED ORDER — SODIUM CHLORIDE 0.9 % IV SOLN
2.0000 g | Freq: Four times a day (QID) | INTRAVENOUS | Status: DC
  Administered 2019-05-11: 2 g via INTRAVENOUS
  Filled 2019-05-11 (×3): qty 2000

## 2019-05-11 MED ORDER — ACETAMINOPHEN 325 MG PO TABS
650.0000 mg | ORAL_TABLET | ORAL | Status: DC | PRN
  Administered 2019-05-12: 650 mg via ORAL
  Filled 2019-05-11: qty 2

## 2019-05-11 MED ORDER — BENZOCAINE-MENTHOL 20-0.5 % EX AERO
1.0000 "application " | INHALATION_SPRAY | CUTANEOUS | Status: DC | PRN
  Administered 2019-05-12: 1 via TOPICAL
  Filled 2019-05-11: qty 56

## 2019-05-11 MED ORDER — IBUPROFEN 600 MG PO TABS
600.0000 mg | ORAL_TABLET | Freq: Four times a day (QID) | ORAL | Status: DC
  Administered 2019-05-12 – 2019-05-13 (×6): 600 mg via ORAL
  Filled 2019-05-11 (×7): qty 1

## 2019-05-11 MED ORDER — TRANEXAMIC ACID 1000 MG/10ML IV SOLN: 1.5000 mg/kg/h | INTRAVENOUS | Status: DC

## 2019-05-11 MED ORDER — TRANEXAMIC ACID-NACL 1000-0.7 MG/100ML-% IV SOLN
INTRAVENOUS | Status: AC
  Filled 2019-05-11: qty 100

## 2019-05-11 MED ORDER — COCONUT OIL OIL: 1.0000 "application " | TOPICAL_OIL | Status: DC | PRN

## 2019-05-11 MED ORDER — SODIUM CHLORIDE (PF) 0.9 % IJ SOLN
INTRAMUSCULAR | Status: DC | PRN
  Administered 2019-05-11: 12 mL/h via EPIDURAL

## 2019-05-11 MED ORDER — WITCH HAZEL-GLYCERIN EX PADS: 1.0000 "application " | MEDICATED_PAD | CUTANEOUS | Status: DC | PRN

## 2019-05-11 MED ORDER — DIBUCAINE (PERIANAL) 1 % EX OINT: 1.0000 "application " | TOPICAL_OINTMENT | CUTANEOUS | Status: DC | PRN

## 2019-05-11 MED ORDER — TETANUS-DIPHTH-ACELL PERTUSSIS 5-2.5-18.5 LF-MCG/0.5 IM SUSP: 0.5000 mL | Freq: Once | INTRAMUSCULAR | Status: DC

## 2019-05-11 MED ORDER — TRANEXAMIC ACID-NACL 1000-0.7 MG/100ML-% IV SOLN
1000.0000 mg | Freq: Once | INTRAVENOUS | Status: AC
  Administered 2019-05-11: 1000 mg via INTRAVENOUS

## 2019-05-11 MED ORDER — LACTATED RINGERS IV SOLN
500.0000 mL | Freq: Once | INTRAVENOUS | Status: AC
  Administered 2019-05-11: 500 mL via INTRAVENOUS

## 2019-05-11 MED ORDER — FENTANYL-BUPIVACAINE-NACL 0.5-0.125-0.9 MG/250ML-% EP SOLN
12.0000 mL/h | EPIDURAL | Status: DC | PRN
  Filled 2019-05-11: qty 250

## 2019-05-11 NOTE — Progress Notes (Signed)
No vitals, fetal heart rate or uterine activity charted at 0200 due to daylight savings.

## 2019-05-11 NOTE — Progress Notes (Signed)
Called MD to verify if Gentamycin and Ampicillin need to be continued postpartum;  Received orders to discontinue; MD informed that patient's temperature was 99.6.

## 2019-05-11 NOTE — Discharge Summary (Signed)
Postpartum Discharge Summary     Patient Name: Natalie Chase DOB: Jul 12, 1980 MRN: [id number]  Date of admission: 05/10/2019 Delivering Provider: Wende Mott   Date of discharge: 05/13/2019  Admitting diagnosis: Encounter for induction of labor [Z34.90] Intrauterine pregnancy: [redacted]w[redacted]d    Secondary diagnosis:  Active Problems:   Chronic hypertension affecting pregnancy   Sickle [place] in [place] affecting pregnancy ([place]   Lupus ([place]ic lupus erythematosus) ([place]   Encounter for induction of labor  Additional problems: None     Discharge diagnosis: Term Pregnancy Delivered and CHTN                                                                                                Post partum procedures:None  Augmentation: Cytotec  Complications: None  Hospital course:  Induction of Labor With Vaginal Delivery   39y.o. yo G3P1011 at 343w3das admitted to the hospital 05/10/2019 for induction of labor.  Indication for induction: CHTN and SLE.  Patient had an uncomplicated labor course as follows: she was induced with cytotec, SROM and quickly progressed to complete.  Membrane Rupture Time/Date: 11:12 AM ,05/11/2019   Intrapartum Procedures: Episiotomy: None [1]                                         Lacerations:  2nd degree [3]  Patient had delivery of a Viable infant.  Information for the patient's newborn:  Natalie Chase, Natalie Chase[[id number]]Delivery Method: Vag-Spont    05/11/2019  Details of delivery can be found in separate delivery note.  Patient had a routine postpartum course. Procardia continued on discharge and BP check requested. She is to discuss contraception method outpatient. Patient is discharged home 05/13/19. Delivery time: 4:05 PM    Magnesium Sulfate received: No BMZ received: No Rhophylac:No MMR:No Transfusion:No  Physical exam  Vitals:   05/12/19 0748 05/12/19 0941 05/12/19 1405 05/12/19 2036  BP: 126/63 138/89 105/77 (!) 121/59  Pulse: 80 94  92 89  Resp: _0 Temp: 98.2 F (36.8 C)  98.5 F (36.9 C) 98.1 F (36.7 C)  TempSrc: Oral  Oral Oral  SpO2: 99%  99% 100%  Weight:      Height:       General: alert, cooperative and no distress Lochia: appropriate Uterine Fundus: firm Incision: N/A DVT Evaluation: No evidence of DVT seen on physical exam. Labs: Lab Results  Component Value Date   WBC 20.5 (H) 05/12/2019   HGB 9.9 (L) 05/12/2019   HCT 30.6 (L) 05/12/2019   MCV 82.7 05/12/2019   PLT 272 05/12/2019   CMP Latest Ref Rng & Units 03/13/2019  Glucose 65 - 99 mg/dL 99  BUN 6 - 20 mg/dL 8  Creatinine 0.57 - 1.00 mg/dL 1.03(H)  Sodium 134 - 144 mmol/L 139  Potassium 3.5 - 5.2 mmol/L 3.8  Chloride 96 - 106 mmol/L 103  CO2 20 - 29 mmol/L 22  Calcium 8.7 - 10.2 mg/dL 9.2  Total  Protein 6.0 - 8.5 g/dL 6.4  Total Bilirubin 0.0 - 1.2 mg/dL <0.2  Alkaline Phos 39 - 117 IU/L 101  AST 0 - 40 IU/L 22  ALT 0 - 32 IU/L 27   Edinburgh Score: Edinburgh Postnatal Depression Scale Screening Tool 05/12/2019  I have been able to laugh and see the funny side of things. 0  I have looked forward with enjoyment to things. 0  I have blamed myself unnecessarily when things went wrong. 2  I have been anxious or worried for no good reason. 0  I have felt scared or panicky for no good reason. 1  Things have been getting on top of me. 1  I have been so unhappy that I have had difficulty sleeping. 0  I have felt sad or miserable. 0  I have been so unhappy that I have been crying. 0  The thought of harming myself has occurred to me. 0  Edinburgh Postnatal Depression Scale Total 4    Discharge instruction: per After Visit Summary and "Baby and Me Booklet".  After visit meds:  Allergies as of 05/13/2019   No Known Allergies     Medication List    STOP taking these medications   Accu-Chek FastClix Lancets Misc   Accu-Chek Guide Me w/Device Kit   Accu-Chek Guide test strip Generic drug: glucose blood   aspirin 81 MG  chewable tablet   Comfort Fit Maternity Supp Lg Misc     TAKE these medications   acetaminophen 325 MG tablet Commonly known as: Tylenol Take 2 tablets (650 mg total) by mouth every 4 (four) hours as needed (for pain scale < 4).   albuterol 108 (90 Base) MCG/ACT inhaler Commonly known as: VENTOLIN HFA Inhale 1-2 puffs into the lungs every 6 (six) hours as needed for wheezing or shortness of breath.   cyclobenzaprine 10 MG tablet Commonly known as: FLEXERIL Take 1 tablet (10 mg total) by mouth every 8 (eight) hours as needed for muscle spasms.   hydroxychloroquine 200 MG tablet Commonly known as: PLAQUENIL Take by mouth daily.   ibuprofen 600 MG tablet Commonly known as: ADVIL Take 1 tablet (600 mg total) by mouth every 6 (six) hours.   Iron-Vitamin C 65-125 MG Tabs Take 1 tablet by mouth every other day. 1 tablet on Monday, Wednesday, Friday   NIFEdipine 60 MG 24 hr tablet Commonly known as: ADALAT CC Take 1 tablet (60 mg total) by mouth daily.   Niva-Plus 27-1 MG Tabs Take 1 tablet by mouth once daily   senna-docusate 8.6-50 MG tablet Commonly known as: Senokot-S Take 2 tablets by mouth daily. Start taking on: May 14, 2019   Vitamin D3 50 MCG (2000 UT) Tabs Take 1 capsule by mouth daily.       Diet: routine diet  Activity: Advance as tolerated. Pelvic rest for 6 weeks.   Outpatient follow up:4 weeks Follow up Appt: Future Appointments  Date Time Provider [place]  05/20/2019  4:00 PM [place] None  06/09/2019 11:00 AM Constant, Peggy, MD [facility] None   Follow up Visit:  Please schedule this patient for Postpartum visit in: 4 weeks with the following provider: MD In-Person For C/S patients schedule nurse incision check in weeks 2 weeks: no High risk pregnancy complicated by: CHTN and lupus Delivery mode:  SVD Anticipated Birth Control:  OCPs PP Procedures needed: BP check  Schedule Integrated BH visit: no    Newborn  Data: Live born female  Birth Weight: [phone]  APGAR: 8, 9  Newborn Delivery   Birth date/time: 05/11/2019 16:05:00 Delivery type: Vaginal, Spontaneous      Baby Feeding: Breast Disposition:home with mother

## 2019-05-11 NOTE — Progress Notes (Signed)
Labor Progress Note Maki Hege is a 39 y.o. G3P1011 at [redacted]w[redacted]d presented for IOL for CHTN.  S:  Feeling occasional ctx.   O:  BP 132/69   Pulse 94   Temp 98.9 F (37.2 C) (Oral)   Resp 17   Ht 5' 4.5" (1.638 m)   Wt 99 kg   LMP 08/13/2018 (Approximate)   BMI 36.88 kg/m   Fetal Tracing:  Baseline: 145 Variability: moderate Accels: 15x15 Decels: none  Toco: irregular    CVE: Dilation: Closed Effacement (%): 50 Cervical Position: Posterior Station: -3 Presentation: Vertex Exam by:: holly sims rn   A&P: 39 y.o. G3P1011 [redacted]w[redacted]d IOL CHTN. #Labor: Continue Cytotec induction. Patient declines foley bulb. Anticipate eventual SVD. #Pain: per patient request #FWB: Cat I #GBS negative  #cHTN: cont Procardia; BP's normal range currently   [name], MD 3:14 AM

## 2019-05-11 NOTE — Progress Notes (Signed)
Pharmacy Antibiotic Note  Natalie Chase is a 39 y.o. female admitted on 05/10/2019 with triple I.  Pharmacy has been consulted for gentamicin dosing.  Plan: Start gentamicin 370mg  (5 mg/kg) Q24H. Will obtain troughs if necessary.   Height: 5' 4.5" (163.8 cm) Weight: 218 lb 3.2 oz (99 kg) IBW/kg (Calculated) : 55.85  Temp (24hrs), Avg:99.1 F (37.3 C), Min:98.1 F (36.7 C), Max:100.4 F (38 C)  Recent Labs  Lab 05/10/19 0533  WBC 14.3*    CrCl cannot be calculated (Patient's most recent lab result is older than the maximum 21 days allowed.).    No Known Allergies  Antimicrobials this admission: Ampicillin 2g Q6H 3/14 >>   Thank you for allowing pharmacy to be a part of this patient's care.  4/14, PharmD, BCPS, BCPPS 05/11/2019 12:04 PM

## 2019-05-11 NOTE — Anesthesia Procedure Notes (Signed)
Epidural Patient location during procedure: OB  Staffing Anesthesiologist: Lewie Loron, MD Performed: anesthesiologist   Preanesthetic Checklist Completed: patient identified, IV checked, risks and benefits discussed, monitors and equipment checked, pre-op evaluation and timeout performed  Epidural Patient position: sitting Prep: DuraPrep and site prepped and draped Patient monitoring: heart rate, blood pressure and continuous pulse ox Approach: midline Location: L3-L4 Injection technique: LOR air and LOR saline  Needle:  Needle type: Tuohy  Needle gauge: 17 G Needle length: 9 cm Needle insertion depth: 8 cm Catheter type: closed end flexible Catheter size: 19 Gauge Catheter at skin depth: 13 cm Test dose: negative  Assessment Sensory level: T8 Events: blood not aspirated, injection not painful, no injection resistance, no paresthesia and negative IV test  Additional Notes Reason for block:procedure for pain

## 2019-05-11 NOTE — Progress Notes (Signed)
Labor Progress Note Natalie Chase is a 39 y.o. G3P1011 at [redacted]w[redacted]d presented for IOL CHTN  S:  Patient reporting SROM and painful contractions. Requesting exam  O:  BP 137/84 (BP Location: Right Arm)   Pulse 99   Temp (!) 100.4 F (38 C) (Oral)   Resp 18   Ht 5' 4.5" (1.638 m)   Wt 99 kg   LMP 08/13/2018 (Approximate)   BMI 36.88 kg/m   Fetal Tracing:  Baseline: 150 Variability: moderate Accels: 15x15 Decels: none  Toco: 1-2   CVE: Dilation: 1 Effacement (%): 80 Cervical Position: Posterior Station: 0 Presentation: Vertex Exam by:: Druscilla Brownie, CNM   A&P: 38 y.o. G3P1011 [redacted]w[redacted]d IOL CHTN #Labor: SROM with clear fluid. Patient now having painful contractions. Also has temp, will treat for Triple I with ampicillin and gentamycin. Expectant management for now.  #Pain: per patient request #FWB: Cat 1 #GBS negative Rolm Bookbinder, CNM 11:52 AM

## 2019-05-11 NOTE — Anesthesia Preprocedure Evaluation (Signed)
Anesthesia Evaluation  Patient identified by MRN, date of birth, ID band Patient awake    Reviewed: Allergy & Precautions, NPO status , Patient's Chart, lab work & pertinent test results  Airway Mallampati: II  TM Distance: >3 FB Neck ROM: Full    Dental   Pulmonary asthma , former smoker,    Pulmonary exam normal breath sounds clear to auscultation       Cardiovascular hypertension, negative cardio ROS Normal cardiovascular exam Rhythm:Regular Rate:Normal     Neuro/Psych negative neurological ROS  negative psych ROS   GI/Hepatic negative GI ROS, Neg liver ROS,   Endo/Other  negative endocrine ROS  Renal/GU negative Renal ROS     Musculoskeletal negative musculoskeletal ROS (+)   Abdominal (+) + obese,   Peds  Hematology negative hematology ROS (+)   Anesthesia Other Findings   Reproductive/Obstetrics (+) Pregnancy                             Anesthesia Physical Anesthesia Plan  ASA: II  Anesthesia Plan: Epidural   Post-op Pain Management:    Induction:   PONV Risk Score and Plan:   Airway Management Planned:   Additional Equipment:   Intra-op Plan:   Post-operative Plan:   Informed Consent: I have reviewed the patients History and Physical, chart, labs and discussed the procedure including the risks, benefits and alternatives for the proposed anesthesia with the patient or authorized representative who has indicated his/her understanding and acceptance.       Plan Discussed with:   Anesthesia Plan Comments:         Anesthesia Quick Evaluation

## 2019-05-11 NOTE — Progress Notes (Signed)
Labor Progress Note Natalie Chase is a 39 y.o. G3P1011 at [redacted]w[redacted]d presented for IOL CHTN  S:  Patient comfortable. Reports intermittent cramping  O:  BP 113/61   Pulse 94   Temp 99.3 F (37.4 C) (Oral)   Resp 18   Ht 5' 4.5" (1.638 m)   Wt 99 kg   LMP 08/13/2018 (Approximate)   BMI 36.88 kg/m   Fetal Tracing:  Baseline: 135 Variability: moderate Accels: 15x15 Decels: none  Toco: 1-3   CVE: Dilation: 1 Effacement (%): 80 Cervical Position: Posterior Station: 0 Presentation: Vertex Exam by:: Druscilla Brownie, CNM   A&P: 39 y.o. G3P1011 [redacted]w[redacted]d IOL CHTN #Labor: Progressing well. Discussed with patient pitocin or another cytotec. Risks and benefits of both and patient desires another cytotec. Declines FB. Discussed pitocin after next exam and patient agreeable.  #Pain: per patient request #FWB: Cat 1 #GBS negative  Rolm Bookbinder, CNM 9:40 AM

## 2019-05-11 NOTE — Progress Notes (Signed)
Labor Progress Note Natalie Chase is a 39 y.o. G3P1011 at [redacted]w[redacted]d presented for IOL for CHTN.  S:  Feeling ctx more frequently but not overly strong.  O:  BP 116/61   Pulse 92   Temp 98.1 F (36.7 C)   Resp 17   Ht 5' 4.5" (1.638 m)   Wt 99 kg   LMP 08/13/2018 (Approximate)   BMI 36.88 kg/m   Fetal Tracing:  Baseline: 150  Variability: moderate Accels: 15x15 Decels: none  Toco: q2-56m   CVE: Dilation: 1 Effacement (%): 50 Cervical Position: Posterior Station: -3 Presentation: Vertex Exam by:: [employer] RN   A&P: 39 y.o. G3P1011 [redacted]w[redacted]d IOL CHTN. #Labor: Continue Cytotec induction; thinning and progressing some with dilation. Patient declines foley bulb. Anticipate eventual SVD. #Pain: per patient request #FWB: Cat I #GBS negative  #cHTN: cont Procardia; BP's normal range currently   [name], MD 5:50 AM

## 2019-05-12 LAB — CBC
HCT: 30.6 % — ABNORMAL LOW (ref 36.0–46.0)
Hemoglobin: 9.9 g/dL — ABNORMAL LOW (ref 12.0–15.0)
MCH: 26.8 pg (ref 26.0–34.0)
MCHC: 32.4 g/dL (ref 30.0–36.0)
MCV: 82.7 fL (ref 80.0–100.0)
Platelets: 272 10*3/uL (ref 150–400)
RBC: 3.7 MIL/uL — ABNORMAL LOW (ref 3.87–5.11)
RDW: 17.2 % — ABNORMAL HIGH (ref 11.5–15.5)
WBC: 20.5 10*3/uL — ABNORMAL HIGH (ref 4.0–10.5)
nRBC: 0 % (ref 0.0–0.2)

## 2019-05-12 NOTE — Lactation Note (Signed)
This note was copied from a baby's chart. Lactation Consultation Note Mom resting, everyone sleeping. Mom looked at [facility] when came into room. LC told mom would visit later. Mom stated ok thank you.  Patient Name: Natalie Chase [id number] Date: 05/12/2019     Maternal Data    Feeding    LATCH Score                   Interventions    Lactation Tools Discussed/Used     Consult Status      Natalie Chase, [name] 05/12/2019, 2:03 AM

## 2019-05-12 NOTE — Anesthesia Postprocedure Evaluation (Signed)
Anesthesia Post Note  Patient: Natalie Chase  Procedure(s) Performed: AN AD HOC LABOR EPIDURAL     Patient location during evaluation: Mother Baby Anesthesia Type: Epidural Level of consciousness: awake and awake and alert Pain management: pain level controlled Vital Signs Assessment: post-procedure vital signs reviewed and stable Respiratory status: spontaneous breathing Cardiovascular status: blood pressure returned to baseline Postop Assessment: no headache, no backache, able to ambulate, patient able to bend at knees, adequate PO intake and no apparent nausea or vomiting Anesthetic complications: no (According to pt - epidural worked well for contractions but she didnt have adequate coverage for delivery  )    Last Vitals:  Vitals:   05/12/19 0406 05/12/19 0748  BP: 119/74 126/63  Pulse: 86 80  Resp: 18 16  Temp: 36.8 C 36.8 C  SpO2:  99%    Last Pain:  Vitals:   05/12/19 0748  TempSrc: Oral  PainSc: 4    Pain Goal: Patients Stated Pain Goal: 4 (05/12/19 0748)              Epidural/Spinal Function Cutaneous sensation: Normal sensation (05/12/19 0748), Patient able to flex knees: Yes (05/12/19 0748), Patient able to lift hips off bed: Yes (05/12/19 0748), Back pain beyond tenderness at insertion site: No (05/12/19 0748), Progressively worsening motor and/or sensory loss: No (05/12/19 0748), Bowel and/or bladder incontinence post epidural: No (05/12/19 0748)  Jennelle Human

## 2019-05-12 NOTE — Lactation Note (Signed)
This note was copied from a baby's chart. Lactation Consultation Note Baby 13 hrs old. Has no interest in BF.  Mom has everted nipples, wouldn't latch. Mom was holding baby STS. Placed in football hold. Attempted multiple times, wouldn't feed. Hand expressed 5 ml colostrum. Spoon fed 3 ml.  Newborn feeding habits, behavior, STS, I&O, milk storage, positioning, support, safety, supply and demand discussed. Mom encouraged to feed baby 8-12 times/24 hours and with feeding cues.  Baby has suck blisters on his lips. Mom stated he sucked on his hands and fingers in the womb, they were surprised he isn't wanting to suckle now.  Noted baby abd. Distended. Mom stated he hasn't thrown up anything. Baby has high palate, labial frenulum. Attempted suck training w/gloved finger, wouldn't suckle on gloved finger, tongue thrust and bites. Mom holding baby STS.  Mom has 76 yr old that she BF for 6 months. Encouraged to call for assistance or questions. Lactation brochure given.  Patient Name: Natalie Chase [id number] Date: 05/12/2019 Reason for consult: Initial assessment;Term   Maternal Data Has patient been taught Hand Expression?: Yes Does the patient have breastfeeding experience prior to this delivery?: Yes  Feeding Feeding Type: Breast Milk  LATCH Score Latch: Too sleepy or reluctant, no latch achieved, no sucking elicited.  Audible Swallowing: None  Type of Nipple: Everted at rest and after stimulation  Comfort (Breast/Nipple): Soft / non-tender  Hold (Positioning): Full assist, staff holds infant at breast  LATCH Score: 4  Interventions Interventions: Breast feeding basics reviewed;Support pillows;Assisted with latch;Position options;Skin to skin;Expressed milk;Breast massage;Hand express;Breast compression;Adjust position  Lactation Tools Discussed/Used WIC Program: Yes   Consult Status Consult Status: Follow-up Date: 05/12/19(in pm) Follow-up type:  In-patient    Charyl Dancer 05/12/2019, 5:12 AM

## 2019-05-12 NOTE — Progress Notes (Addendum)
POSTPARTUM PROGRESS NOTE  Post Partum Day 1  Subjective:  Natalie Chase is a 39 y.o. [id number] s/p SVD at [redacted]w[redacted]d.  She reports she is doing well. No acute events overnight. She denies any problems with ambulating, voiding or po intake. Denies nausea or vomiting.  Pain is moderately controlled.  Lochia is appropriate and improving.  Objective: Blood pressure 119/74, pulse 86, temperature 98.2 F (36.8 C), resp. rate 18, height 5' 4.5" (1.638 m), weight 99 kg, last menstrual period 08/13/2018, SpO2 100 %, unknown if currently breastfeeding.  Physical Exam:  General: alert, cooperative and no distress Chest: no respiratory distress Heart:regular rate, distal pulses intact Abdomen: soft, nontender Uterine Fundus: firm, appropriately tender DVT Evaluation: No calf swelling or tenderness Extremities: no edema Skin: warm, dry  Recent Labs    05/11/19 1254 05/11/19 1801  HGB 12.3 10.6*  HCT 37.3 32.4*    Assessment/Plan: Natalie Chase is a 39 y.o. [id number] s/p SVD at [redacted]w[redacted]d   PPD#1 - Doing well. Continue routine postpartum care. Chronic HTN: Currently on Procardia 60mg  qday. Mostly well controlled blood pressures. No SBP >140 since delivery. SLE: Continue home Plaquenil 200mg  qday Triple I: Afebrile since delivery. Discontinued ampicillin and gentamicin postpartum.   Contraception: OCP Feeding: Breast Dispo: Plan for discharge tomorrow.   LOS: 2 days    MD, PGY-1 OBGYN Faculty Teaching Service  05/12/2019, 7:03 AM   I saw and evaluated the patient. I agree with the findings and the plan of care as documented in the resident's note.  Aldean Jewett, MD [facility] Fellow, [facility] for [facility], [facility]

## 2019-05-12 NOTE — Lactation Note (Signed)
This note was copied from a baby's chart. Lactation Consultation Note  Patient Name: Natalie Chase [id number] Date: 05/12/2019 Reason for consult: Follow-up assessment  P2 mother whose infant is now 47 hours old.  This is a term baby at 20+51 weeks old.  Mother breast fed her first child (now 37 years old) for 6 months.  Baby was asleep when I arrived.  Mother had recently attempted to breast feed.  Mother is concerned that he remains sleepy.  Reviewed first 24 hour behavior.  Encouraged mother to unswaddle and change a diaper prior to the next feeding.  Suggested she arouse him and give him a few colostrum drops to help awaken prior to latching.  Mother stated that he will latch but only for a few minutes.  Informed her that he will probably begin to awaken more tonight.  Mother will continue to feed 8-12 times/24 hours or sooner if baby shows feeding cues.  Suggested hand expression before/after feedings to help with milk supply.  Mother has been doing this.  Suggested she call her RN/LC at the next feeding if baby does not awaken.    Mother is a [facility] participant in [place].  She contacted the office today and has an appointment set up for 05/20/19.  The [facility] representative told her to call the office if she feels she will need a pump at discharge.  Otherwise, she will keep her current appointment.  Father present.   Maternal Data    Feeding Feeding Type: (infant cueing- encouraged MOB to feed)  LATCH Score                   Interventions    Lactation Tools Discussed/Used     Consult Status Consult Status: Follow-up Date: 05/13/19 Follow-up type: In-patient    Dora Sims 05/12/2019, 4:34 PM

## 2019-05-13 MED ORDER — ACETAMINOPHEN 325 MG PO TABS: 650.0000 mg | ORAL_TABLET | ORAL | 0 refills | Status: DC | PRN

## 2019-05-13 MED ORDER — NIFEDIPINE ER 30 MG PO TB24: 30.0000 mg | ORAL_TABLET | Freq: Every day | ORAL | 1 refills | Status: DC

## 2019-05-13 MED ORDER — NIFEDIPINE ER OSMOTIC RELEASE 30 MG PO TB24
30.0000 mg | ORAL_TABLET | Freq: Every day | ORAL | Status: DC
  Administered 2019-05-13: 30 mg via ORAL

## 2019-05-13 MED ORDER — SENNOSIDES-DOCUSATE SODIUM 8.6-50 MG PO TABS: 2.0000 | ORAL_TABLET | ORAL | 0 refills | Status: DC

## 2019-05-13 MED ORDER — NIFEDIPINE ER 60 MG PO TB24: 60.0000 mg | ORAL_TABLET | Freq: Every day | ORAL | 0 refills | Status: DC

## 2019-05-13 MED ORDER — IBUPROFEN 600 MG PO TABS: 600.0000 mg | ORAL_TABLET | Freq: Four times a day (QID) | ORAL | 0 refills | Status: DC

## 2019-05-13 NOTE — Progress Notes (Signed)
Called by RN re: BP. Last BPs 108/48 & 115/69. Pt occasionally feels lightheaded. Will reduce Procardia from 60 mg to 30 mg.

## 2019-05-13 NOTE — Lactation Note (Signed)
This note was copied from a baby's chart. Lactation Consultation Note  Patient Name: Boy Shary Lamos [id number] Date: 05/13/2019 Reason for consult: Follow-up assessment;Mother's request;Difficult latch P2, 35 hour term female infant, -3% weight loss. Mom hx: lupus and HTN Per mom, she has been using DEBP but concern she not seeing any colostrum when pumping, LC explained that colostrum is thick and to continue with using DEBP it help with stimulation and establishing her milk supply and after pumping mom can hand express. Mom knows to offer infant any EBM. Per mom, infant will not sustain latch and breastfed only for 2 to 3 minutes then falls asleep. LC entered room mom was holding infant doing STS and infant starting cuing to breastfeed. LC ask mom to hand express small amount of colostrum prior to latching infant, infant latched to right breast using the football hold position, mom brought infant chin first, nose and chin touching breast. Infant breastfed for 11 minutes with swallows observed. LC reviewed hand expression and infant was given 8 mls of colostrum by spoon. Infant was still cuing, mom re-latched infant on right breast and was still breastfeeding when [facility] left the room.  Mom knows if she has any questions, concerns or need further assistance with latching infant at breast to call RN or LC services.      Maternal Data Has patient been taught Hand Expression?: Yes  Feeding    LATCH Score Latch: Grasps breast easily, tongue down, lips flanged, rhythmical sucking.  Audible Swallowing: Spontaneous and intermittent  Type of Nipple: Everted at rest and after stimulation  Comfort (Breast/Nipple): Soft / non-tender  Hold (Positioning): Assistance needed to correctly position infant at breast and maintain latch.  LATCH Score: 9  Interventions Interventions: Assisted with latch;Adjust position;Skin to skin;Breast compression;Support pillows;Position options;Hand  express;Expressed milk;Breast massage  Lactation Tools Discussed/Used     Consult Status Consult Status: Follow-up Date: 05/13/19 Follow-up type: In-patient    Danelle Earthly 05/13/2019, 3:30 AM

## 2019-05-13 NOTE — Lactation Note (Signed)
This note was copied from a baby's chart. Lactation Consultation Note:  Mother has Lupus and HTN: she reports lots of pain and soreness today. She is taking Plaquenil, L2.  Mother is a P2, infant is 36 hours old and is now at 3% wt loss.  Assist mother with latching infant on in football hold on the rt breast. Infant sustained feeding for 31 mins. Mother was able to observe infant transferring milk with suck swallowing behavior. Mother taught breast compression. Mother elated to observed infant with good feeding pattern.   Reviewed hand expression with mother. Observed large drops of colostrum. Mother was given a harmony hand pump with instructions. Mothers nipples are erect with slight soreness. She was given comfort gels.  Mother was advised to phone [facility] and go by today to get a pump. Mother has an appt on March 23 but will likely be able to get her pump today.    She is active with WIC . Mother has a DEBP sat up at the bedside.   Plan of Care : Breastfeed infant with feeding cues Supplement infant with ebm/ Pump using a DEBP after each feeding for 15-20 mins.   Discussed treatment and prevention of engorgement. Advised mother to nap frequently when infant naps. Mother receptive to all teaching.  Mother to continue to cue base feed infant and feed at least 8-12 times or more in 24 hours and advised to allow for cluster feeding infant as needed.   Mother to continue to due STS. Mother is aware of available LC services at [facility], BFSG'S, OP Dept, and phone # for questions or concerns about breastfeeding.  Mother receptive to all teaching and plan of care.    Patient Name: Natalie Chase Date: 05/13/2019 Reason for consult: Follow-up assessment   Maternal Data    Feeding Feeding Type: Breast Fed  LATCH Score Latch: Grasps breast easily, tongue down, lips flanged, rhythmical sucking.  Audible Swallowing: Spontaneous and intermittent  Type of Nipple: Everted at rest and  after stimulation  Comfort (Breast/Nipple): Soft / non-tender  Hold (Positioning): Assistance needed to correctly position infant at breast and maintain latch.  LATCH Score: 9  Interventions Interventions: Assisted with latch;Skin to skin;Hand express;Breast compression;Adjust position;Support pillows;Position options;Comfort gels;Hand pump  Lactation Tools Discussed/Used     Consult Status Consult Status: Complete    Michel Bickers 05/13/2019, 9:36 AM

## 2019-05-14 ENCOUNTER — Telehealth: Payer: Self-pay

## 2019-05-14 NOTE — Telephone Encounter (Signed)
Return call to pt regarding breast discomfort while breastfeeding C/o : chills, fatigue , breast warm to touch Onset yesterday Not able to check temp Notes breastfeeding every 2 hrs Pt advised on warm compress, expressing milk, warm shower etc to release clogged ducts pt states she has done all measures to release and still has lumps.  Will consult with provider.

## 2019-05-15 ENCOUNTER — Encounter: Payer: Self-pay | Admitting: Medical

## 2019-05-15 ENCOUNTER — Other Ambulatory Visit: Payer: Self-pay

## 2019-05-15 ENCOUNTER — Ambulatory Visit (INDEPENDENT_AMBULATORY_CARE_PROVIDER_SITE_OTHER): Payer: Medicaid Other | Admitting: Medical

## 2019-05-15 VITALS — BP 143/91 | HR 106 | Wt 208.0 lb

## 2019-05-15 DIAGNOSIS — I10 Essential (primary) hypertension: Secondary | ICD-10-CM

## 2019-05-15 DIAGNOSIS — N6459 Other signs and symptoms in breast: Secondary | ICD-10-CM | POA: Diagnosis not present

## 2019-05-15 NOTE — Progress Notes (Signed)
  History:  Ms. Natalie Chase is a 39 y.o. [id number] who presents to clinic today for bilateral breast pain. The patient delivered 05/11/19. She has been breastfeeding and having difficulty with baby latch since she went home. She is pumping ~ 2 hours for up to 30 minutes to empty. She states low grade fever, body aches and fatigue. She denies any focal areas of increased pain or redness. Pain is diffuse and bilateral.    The following portions of the patient's history were reviewed and updated as appropriate: allergies, current medications, family history, past medical history, social history, past surgical history and problem list.  Review of Systems:  Review of Systems  Constitutional: Positive for chills and fever.  Gastrointestinal: Negative for abdominal pain.  Skin: Negative for rash.      Objective:  Physical Exam BP (!) 143/91   Pulse (!) 106   Wt 208 lb (94.3 kg)   LMP 08/13/2018 (Approximate)   BMI 35.15 kg/m  Physical Exam  Constitutional: She is oriented to person, place, and time. She appears well-developed and well-nourished. No distress.  HENT:  Head: Normocephalic.  Cardiovascular: Tachycardia present.  Respiratory: Effort normal. Right breast exhibits mass (mulitple large ducts palpable), nipple discharge (milky discharge) and tenderness (diffuse). Right breast exhibits no inverted nipple and no skin change. Left breast exhibits mass, nipple discharge and tenderness. Left breast exhibits no inverted nipple and no skin change. Breasts are symmetrical.  Neurological: She is alert and oriented to person, place, and time.  Skin: Skin is warm and dry. No erythema.  Psychiatric: She has a normal mood and affect.    Assessment & Plan:  1. Engorgement of breasts - Discussed need to continue pumping - Warm compresses and massage of the clogged ducts recommended - Tylenol or Ibuprofen for pain or fever  - Lactation appointment made for tomorrow at 9:15 am at [facility] -  Warning signs for worsening condition discussed   2. Essential hypertension - Initial BP elevated 160/93, patient endorses headache and occasional floaters  - repeat BP is 143/91 - Discussed with Dr. Debroah Loop, increased Procardia back to 60 mg daily - BP check at home if symptomatic - BP check in office as scheduled next week    Natalie Chase 05/15/2019 3:14 PM

## 2019-05-15 NOTE — Patient Instructions (Addendum)
Breast Engorgement Breast engorgement is the overfilling of your breasts with breast milk. It is usually caused by delaying feedings, which can cause milk to build up. Breast engorgement can happen at any time while you are breast feeding, and is normal in the first 3-5 days after giving birth. The condition can make your breasts feel heavy, full, hard, tightly stretched, warm, and tender. Breast engorgement should improve within 24-48 hours of feeding your baby or expressing your milk. Follow these instructions at home: When to breastfeed or pump  Breastfeed when your baby shows signs of hunger. This is called "breastfeeding on demand."  Breastfeed or use a breast pump to remove milk from your breasts when you feel the need to reduce the fullness of your breasts.  If your baby is younger than 1 month, make sure you are breastfeeding every 1-3 hours during the day. You may need to wake up your baby to feed if he or she is asleep at a feeding time.  Do not allow your baby to sleep longer than 5 hours during the night without a feeding.  Do not delay feedings.  If you are returning to work or are away from home for an extended period, try to pump your milk on the same schedule as when your baby would breastfeed. Before breastfeeding or pumping:  Increase the circulation in your breasts and help your milk flow. Try either of these methods: ? Taking a warm shower. ? Applying warm, water-soaked hand towels to your breasts. ? Massaging your breasts.  Pump or hand-express breast milk before breastfeeding to soften your breast, areola, and nipple. During breastfeeding or pumping:  Try to relax when it is time to feed your baby. This helps to trigger your "let-down reflex," which releases milk from your breast.  Ensure your baby is latched on to your breast and positioned properly while breastfeeding.  Empty your breasts completely when breastfeeding or pumping.  Allow your baby to remain at  your breast as long as he or she is latched on well and sucking. Your baby will let you know when he or she is done breastfeeding by pulling away from your breast or falling asleep.  Massage your breasts to help your milk flow. Managing pain and swelling   Take over-the-counter and prescription medicines only as told by your health care provider.  If directed, put ice on your breasts: ? Put ice in a plastic bag. ? Place a towel between your skin and the bag. ? Leave the ice on for 20 minutes, 2-3 times a day.  If you feel pain while breastfeeding, take your baby off your breast and try again. General instructions  After breastfeeding or pumping wear a snug bra or tank top for 1-2 days. This will signal your body to slightly decrease how much milk it makes. Once the engorgement passes, make sure you to wear a well-fitted, supportive bra and regular clothes.  Drink enough fluid to keep your urine clear or pale yellow.  Avoid introducing bottles or pacifiers to your baby in the early weeks of breastfeeding. Wait to introduce these things until after resolving any breastfeeding challenges. Contact a health care provider if:  Engorgement lasts longer than 2 days, even after treatment.  You have flu-like symptoms, such as a fever, chills, or body aches.  You have nausea or you vomit.  Your breasts become red and painful.  You have a lump in your breast.  Your nipples continue to crack or start to  ooze.  There is yellow discharge coming from a nipple.  You have pain while breastfeeding, and it does not go away once you take your baby off your breast and try again. Get help right away if:  There is pus or blood in your breast milk.  You have sudden, severe symptoms.  You have red streaks near your breast.  Both breasts appear infected and you cannot breastfeed. Summary  Breast engorgement is the overfilling of your breasts with breast milk. It is usually caused by delayed  feeding.  Although it is normal to experience breast engorgement 3-5 days after giving birth, it can happen at any time while breastfeeding.  Do not delay feedings. Breastfeed on demand to help prevent engorgement.  Increase the circulation in your breasts and help your milk flow before feeding your baby. You can do this by taking a warm shower, applying warm water-soaked hand towels, or massaging your breasts. This information is not intended to replace advice given to you by your health care provider. Make sure you discuss any questions you have with your health care provider. Document Revised: 01/26/2017 Document Reviewed: 03/20/2016 Elsevier Patient Education  2020 [employer]    You have an appointment with lactation at 9:15 am on Friday, March 19th  Make sure baby is with you and hungry.

## 2019-05-20 ENCOUNTER — Other Ambulatory Visit: Payer: Self-pay

## 2019-05-20 ENCOUNTER — Ambulatory Visit: Payer: Medicaid Other | Admitting: *Deleted

## 2019-05-20 DIAGNOSIS — O165 Unspecified maternal hypertension, complicating the puerperium: Secondary | ICD-10-CM

## 2019-05-20 MED ORDER — NIFEDIPINE ER OSMOTIC RELEASE 60 MG PO TB24: 60.0000 mg | ORAL_TABLET | Freq: Every day | ORAL | 1 refills | Status: DC

## 2019-05-20 NOTE — Progress Notes (Signed)
Subjective:  Natalie Chase is a 39 y.o. female here for BP check.   Hypertension ROS: taking medications as instructed, no medication side effects noted, no TIA's, no chest pain on exertion, no dyspnea on exertion and no swelling of ankles.    Objective:  BP 125/80   Pulse 97   LMP 08/13/2018 (Approximate)  LMP 08/13/2018 (Approximate)   Appearance alert, well appearing, and in no distress.  General exam BP noted to be well controlled today in office.   Assessment:   Blood Pressure well controlled.   Plan:  Current treatment plan is effective, no change in therapy.   Per Dr Alysia Penna, pt to remain on 60mg  Nifedipine daily- pt aware. New Rx for Nifedipine 60mg  to be sent today. Follow up for PP visit.

## 2019-05-20 NOTE — Progress Notes (Signed)
Agree with A & P. 

## 2019-06-03 ENCOUNTER — Other Ambulatory Visit: Payer: Self-pay | Admitting: *Deleted

## 2019-06-03 ENCOUNTER — Telehealth: Payer: Self-pay | Admitting: Licensed Clinical Social Worker

## 2019-06-03 ENCOUNTER — Encounter: Payer: Medicaid Other | Admitting: Licensed Clinical Social Worker

## 2019-06-03 DIAGNOSIS — F418 Other specified anxiety disorders: Secondary | ICD-10-CM

## 2019-06-03 DIAGNOSIS — O99345 Other mental disorders complicating the puerperium: Secondary | ICD-10-CM

## 2019-06-04 ENCOUNTER — Telehealth: Payer: Self-pay | Admitting: *Deleted

## 2019-06-04 NOTE — Telephone Encounter (Signed)
Late entry: Received call from [employer] nurse regarding [name] depression score- 17.  States that pt has been having HA and seeing spots.   Spoke with pt on Tuesday, states she is doing well. Pt states she does have some anxiety and does not have much help at home.  Pt states she does not have much family that lives local- she has an uncle and her 37 y/o son that may help her some.   Pt was offered a referral to counseling in order to discuss anxiety and how to cope at home.  Referral was ordered and pt scheduled.   Pt states she has not been checking her BP at home.  Pt states she is still taking her BP medication. Pt states she has been having HA's and "seeing stars" for the past couple days.  Pt denies any other symptoms. Advised pt that she should check her BP while at home- did not check during call.   Pt was offered appt on Tuesday and she could not come in due to baby having an appt.   Pt was made will check schedule for other appts available and receive a return call.  Attempted to call pt several times throughout day with no answer, cannot leave VM.  Attempted to call again today with no answer and no VM.

## 2019-06-09 ENCOUNTER — Ambulatory Visit (INDEPENDENT_AMBULATORY_CARE_PROVIDER_SITE_OTHER): Payer: Medicaid Other | Admitting: Obstetrics and Gynecology

## 2019-06-09 ENCOUNTER — Ambulatory Visit (INDEPENDENT_AMBULATORY_CARE_PROVIDER_SITE_OTHER): Payer: Medicaid Other | Admitting: Licensed Clinical Social Worker

## 2019-06-09 ENCOUNTER — Other Ambulatory Visit: Payer: Self-pay

## 2019-06-09 ENCOUNTER — Encounter: Payer: Self-pay | Admitting: Obstetrics and Gynecology

## 2019-06-09 DIAGNOSIS — F4321 Adjustment disorder with depressed mood: Secondary | ICD-10-CM

## 2019-06-09 MED ORDER — SERTRALINE HCL 50 MG PO TABS: 50.0000 mg | ORAL_TABLET | Freq: Every day | ORAL | 3 refills | Status: DC

## 2019-06-09 MED ORDER — NORETHINDRONE 0.35 MG PO TABS: 1.0000 | ORAL_TABLET | Freq: Every day | ORAL | 4 refills | Status: DC

## 2019-06-09 NOTE — Progress Notes (Signed)
    Post Partum Visit Note  Natalie Chase is a 39 y.o. G53P2012 female who presents for a postpartum visit. She is 4 weeks postpartum following a normal spontaneous vaginal delivery.  I have fully reviewed the prenatal and intrapartum course. The delivery was at 39 gestational weeks.  Anesthesia: epidural. Postpartum course has been unremarkable. Baby is doing well Baby is feeding by both breast and bottle - Breast milk in bottle Pumping. . Bleeding staining only. Bowel function is normal. Bladder function is normal. Patient is not sexually active. Contraception method is none. Postpartum depression screening: positive. EPDS = 12. Patient denies suicidal ideation but admits to feeling overwhelmed and lacking support. She does not have any family in [place] and the pandemic has made traveling difficult.     Review of Systems Pertinent items noted in HPI and remainder of comprehensive ROS otherwise negative.    Objective:  Blood pressure (!) 142/90, pulse 80, weight 207 lb (93.9 kg), last menstrual period 08/13/2018, unknown if currently breastfeeding.  General:  alert, cooperative and no distress   Breasts:  inspection negative, no nipple discharge or bleeding, no masses or nodularity palpable  Lungs: clear to auscultation bilaterally  Heart:  regular rate and rhythm  Abdomen: soft, non-tender; bowel sounds normal; no masses,  no organomegaly   Vulva:  normal  Vagina: normal vagina, no discharge, exudate, lesion, or erythema  Cervix:  multiparous appearance  Corpus: normal size, contour, position, consistency, mobility, non-tender  Adnexa:  normal adnexa and no mass, fullness, tenderness  Rectal Exam: Not performed.        Assessment:    Normal postpartum exam. Pap smear 06/11/2017 WNL  at today's visit.   Plan:   Essential components of care per ACOG recommendations:  1.  Mood and well being: Patient with positive depression screening today. Reviewed local resources for support.  Rx Zoloft provided and patient scheduled to see behavioral health speacialist  2. Infant care and feeding:  -Patient currently breastmilk feeding? Yes Patient was provided letter for work to allow for every 2-3 hr pumping breaks, and to be granted a private location to express breastmilk and refrigerated area to store breastmilk. Reviewed importance of draining breast regularly to support lactation.   3. Sexuality, contraception and birth spacing - Patient does want a pregnancy in the next year.     - Reviewed forms of contraception in tiered fashion. Patient desired oral progesterone-only contraceptive today.   - Discussed birth spacing of 18 months  4. Sleep and fatigue -Encouraged family/partner/community support of 4 hrs of uninterrupted sleep to help with mood and fatigue  5. Physical Recovery  - Discussed patients delivery and complications - Patient had a second degree laceration, perineal healing reviewed. Patient expressed understanding - Patient has urinary incontinence? No  - Patient is safe to resume physical and sexual activity  6.  Health Maintenance - Last pap smear done 06/11/2017 and was normal with negative HPV.   7. Chronic HTN - Continue procardia BID - PCP follow up  [facility] [employer], [facility]

## 2019-06-09 NOTE — Progress Notes (Deleted)
    Post Partum Visit Note  Natalie Chase is a 39 y.o. G18P2012 female who presents for a postpartum visit. She is {1-10:13787} {time; units:18646} postpartum following a {method of delivery:313099}.  I have fully reviewed the prenatal and intrapartum course. The delivery was at *** gestational weeks.  Anesthesia: {anesthesia types:812}. Postpartum course has been ***. Baby is doing well***. Baby is feeding by {breast/bottle:69}. Bleeding {vag bleed:12292}. Bowel function is {normal:32111}. Bladder function is {normal:32111}. Patient {is/is not:9024} sexually active. Contraception method is {contraceptive method:5051}. Postpartum depression screening: {gen negative/positive:315881}.  {Common ambulatory SmartLinks:19316}  Review of Systems {ros; complete:30496}    Objective:  Last menstrual period 08/13/2018, unknown if currently breastfeeding.  General:  {gen appearance:16600}   Breasts:  {breast exam:1202::"inspection negative, no nipple discharge or bleeding, no masses or nodularity palpable"}  Lungs: {lung exam:16931}  Heart:  {heart exam:5510}  Abdomen: {abdomen exam:16834}   Vulva:  {labia exam:12198}  Vagina: {vagina exam:12200}  Cervix:  {cervix exam:14595}  Corpus: {uterus exam:12215}  Adnexa:  {adnexa exam:12223}  Rectal Exam: {rectal/vaginal exam:12274}        Assessment:    *** postpartum exam. Pap smear {done:10129} at today's visit.   Plan:   Essential components of care per ACOG recommendations:  1.  Mood and well being: Patient with {gen negative/positive:315881} depression screening today. Reviewed local resources for support.  - Patient {Action; does/does not:19097} use tobacco. ***If using tobacco we discussed reduction and for recently cessation risk of relapse - hx of drug use? {yes/no:20286}  *** If yes, discussed support systems  2. Infant care and feeding:  -Patient currently breastmilk feeding? {yes/no:20286} ***If breastmilk feeding discussed return to  work and pumping. If needed, patient was provided letter for work to allow for every 2-3 hr pumping breaks, and to be granted a private location to express breastmilk and refrigerated area to store breastmilk. Reviewed importance of draining breast regularly to support lactation. -Social determinants of health (SDOH) reviewed in EPIC. No concerns***The following needs were identified***  3. Sexuality, contraception and birth spacing - Patient {DOES_DOES WCH:85277} want a pregnancy in the next year.  Desired family size is {NUMBER 1-10:22536} children.  - Reviewed forms of contraception in tiered fashion. Patient desired {PLAN CONTRACEPTION:313102} today.   - Discussed birth spacing of 18 months  4. Sleep and fatigue -Encouraged family/partner/community support of 4 hrs of uninterrupted sleep to help with mood and fatigue  5. Physical Recovery  - Discussed patients delivery*** and complications - Patient had a *** degree laceration, perineal healing reviewed. Patient expressed understanding - Patient has urinary incontinence? {yes/no:20286}*** Patient was referred to pelvic floor PT  - Patient {ACTION; IS/IS OEU:23536144} safe to resume physical and sexual activity  6.  Health Maintenance - Last pap smear done *** and was {Normal/abnormal wildcard:19619} with negative HPV. ***Mammogram  7. ***Chronic Disease - PCP follow up  Demetrice A Cheree Ditto, CMA [facility] [employer], [facility]

## 2019-06-09 NOTE — BH Specialist Note (Signed)
Integrated Behavioral Health Initial Visit  MRN: [id number] Name: Ashna Dorough  Number of Integrated Behavioral Health Clinician visits:: 1 Session Start time: 11:40am Session End time: 12:00pm Total time: 20 minutes   Type of Service: Integrated Behavioral Health- Individual Interpretor:no  Interpretor Name and Language: none    Warm Hand Off Completed.       SUBJECTIVE: Zamyah Wiesman is a 39 y.o. female accompanied by n/a Patient was referred by Chana Bode MD  for depression. Patient reports the following symptoms/concerns: social isolation, feeling of guilt  Duration of problem: current pregnancy; Severity of problem: moderate   OBJECTIVE: Mood: good and Affect: congruent Risk of harm to self or others: no risk of harm to self or others.   LIFE CONTEXT: Family and Social: Lives in [place] with children and significant other School/Work: n/a Self-Care: none Life Changes: New baby after 16 years   GOALS ADDRESSED: Patient will: 1. Reduce symptoms of: social isolation, feelings of guilt  2. Increase knowledge and/or ability of:  3. Demonstrate ability to: self manage symptoms   INTERVENTIONS: Interventions utilized: Psychoeducation focused on postpartum depression and cbt.  Standardized Assessments completed: edinburgh assessment  ASSESSMENT: Patient currently experiencing adjustment disorder with depressed mood. Ms. Smedberg reports feeling of guilt when leaving newborn briefly or unable to soothe newborn. Discuss proper attachment with newborn and allowing family to assist when possible.    Patient may benefit from integrated behavioral health  PLAN: 1. Follow up with behavioral health clinician on : 06/30/2019 2. Behavioral recommendations: Discuss with Ms. Hettinger importance of self care, accepting and asking for help, increase social interaction, and discuss various mindfulness techniques, take prescribed medication as directed 3. Referral(s): none 4. "From scale  of 1-10, how likely are you to follow plan?":   Gwyndolyn Saxon, LCSW

## 2019-06-09 NOTE — Progress Notes (Deleted)
Subjective:     Natalie Chase is a 39 y.o. female who presents for a postpartum visit. She is 4 weeks postpartum following a spontaneous vaginal delivery. I have fully reviewed the prenatal and intrapartum course. The delivery was at 39 gestational weeks following IOL due to [facility]. Outcome: spontaneous vaginal delivery. Anesthesia: epidural. Postpartum course has been ***. Baby's course has been ***. Baby is feeding by {breast/bottle:69}. Bleeding {vag bleed:12292}. Bowel function is {normal:32111}. Bladder function is {normal:32111}. Patient {is/is not:9024} sexually active. Contraception method is {contraceptive method:5051}. Postpartum depression screening: {neg default:13464::"negative"}.     Review of Systems Pertinent items noted in HPI and remainder of comprehensive ROS otherwise negative.   Objective:    LMP 08/13/2018 (Approximate)   General:  {gen appearance:16600}   Breasts:  {breast exam:1202::"inspection negative, no nipple discharge or bleeding, no masses or nodularity palpable"}  Lungs: {lung exam:16931}  Heart:  {heart exam:5510}  Abdomen: {abdomen exam:16834}   Vulva:  {labia exam:12198}  Vagina: {vagina exam:12200}  Cervix:  {cervix exam:14595}  Corpus: {uterus exam:12215}  Adnexa:  {adnexa exam:12223}  Rectal Exam: {rectal/vaginal exam:12274}        Assessment:    *** postpartum exam. Pap smear not done at today's visit.   Plan:    1. Contraception: {method:5051} 2. *** 3. Follow up in: {1-10:13787} {time; units:19136} or as needed.

## 2019-06-11 ENCOUNTER — Other Ambulatory Visit (INDEPENDENT_AMBULATORY_CARE_PROVIDER_SITE_OTHER): Payer: Self-pay | Admitting: Obstetrics and Gynecology

## 2019-06-30 ENCOUNTER — Ambulatory Visit (INDEPENDENT_AMBULATORY_CARE_PROVIDER_SITE_OTHER): Payer: Medicaid Other | Admitting: Licensed Clinical Social Worker

## 2019-06-30 DIAGNOSIS — F4321 Adjustment disorder with depressed mood: Secondary | ICD-10-CM

## 2019-06-30 DIAGNOSIS — O99340 Other mental disorders complicating pregnancy, unspecified trimester: Secondary | ICD-10-CM

## 2019-06-30 DIAGNOSIS — Z3A00 Weeks of gestation of pregnancy not specified: Secondary | ICD-10-CM

## 2019-06-30 NOTE — BH Specialist Note (Signed)
Integrated Behavioral Health via Telemedicine Video Visit  06/30/2019 Natalie Chase [id number]  Number of Integrated Behavioral Health visits: 2 Session Start time: 1:30pm  Session End time: 1:52 Total time: 22 mins  Referring Provider: P.Constant MD Type of Visit: Video Patient/Family location: Home  [facility] Provider location: [facility]  All persons participating in visit: Pt and LCSWA Natalie Chase  Confirmed patient's address: yes Confirmed patient's phone number: yes Any changes to demographics: yes  Confirmed patient's insurance: no Any changes to patient's insurance: no  Discussed confidentiality: yes  I connected with Natalie Chase and/or Natalie Chase n/Natalie by Natalie video enabled telemedicine application and verified that I am speaking with the correct person using two identifiers.     I discussed the limitations of evaluation and management by telemedicine and the availability of in person appointments.  I discussed that the purpose of this visit is to provide behavioral health care while limiting exposure to the novel coronavirus.   Discussed there is Natalie possibility of technology failure and discussed alternative modes of communication if that failure occurs.  I discussed that engaging in this video visit, they consent to the provision of behavioral healthcare and the services will be billed under their insurance.  Patient and/or legal guardian expressed understanding and consented to video visit: yes  PRESENTING CONCERNS: Patient and/or family reports the following symptoms/concerns: feeling of guilt, worry, anxiety  Duration of problem: pregnancy ; Severity of problem: mild   STRENGTHS (Protective Factors/Coping Skills): Partner and older son are supportive  GOALS ADDRESSED: Patient will: 1.  Reduce symptoms of: anxiety, worry and postpartum depression  2.  Increase knowledge of diagnosis and interventions  3.  Demonstrate ability to: self manage symptoms    INTERVENTIONS: Interventions utilized: supportive counseling  Standardized Assessments completed: edinburgh completed   ASSESSMENT: Patient currently experiencing adjustment disorder with depressed mood   Patient may benefit from integrated behavioral health   PLAN: 1. Follow up with behavioral health clinician on : 4 weeks  2. Behavioral recommendations: continue demonstrating mindfulness techniques and self care techniques  3. Referral(s): none   I discussed the assessment and treatment plan with the patient and/or parent/guardian. They were provided an opportunity to ask questions and all were answered. They agreed with the plan and demonstrated an understanding of the instructions.   They were advised to call back or seek an in-person evaluation if the symptoms worsen or if the condition fails to improve as anticipated.  Gwyndolyn Saxon

## 2019-07-09 ENCOUNTER — Other Ambulatory Visit: Payer: Self-pay

## 2019-07-09 ENCOUNTER — Encounter (INDEPENDENT_AMBULATORY_CARE_PROVIDER_SITE_OTHER): Payer: Self-pay | Admitting: Primary Care

## 2019-07-09 ENCOUNTER — Ambulatory Visit (INDEPENDENT_AMBULATORY_CARE_PROVIDER_SITE_OTHER): Payer: Medicaid Other | Admitting: Primary Care

## 2019-07-09 VITALS — BP 124/77 | HR 78 | Temp 97.3°F | Resp 16 | Wt 209.6 lb

## 2019-07-09 DIAGNOSIS — R6339 Other feeding difficulties: Secondary | ICD-10-CM

## 2019-07-09 DIAGNOSIS — O99345 Other mental disorders complicating the puerperium: Secondary | ICD-10-CM | POA: Diagnosis not present

## 2019-07-09 DIAGNOSIS — R633 Feeding difficulties: Secondary | ICD-10-CM | POA: Diagnosis not present

## 2019-07-09 DIAGNOSIS — F53 Postpartum depression: Secondary | ICD-10-CM

## 2019-07-09 DIAGNOSIS — I10 Essential (primary) hypertension: Secondary | ICD-10-CM

## 2019-07-09 DIAGNOSIS — O165 Unspecified maternal hypertension, complicating the puerperium: Secondary | ICD-10-CM

## 2019-07-09 MED ORDER — NIFEDIPINE ER OSMOTIC RELEASE 60 MG PO TB24: 60.0000 mg | ORAL_TABLET | Freq: Every day | ORAL | 1 refills | Status: DC

## 2019-07-09 MED ORDER — ALBUTEROL SULFATE HFA 108 (90 BASE) MCG/ACT IN AERS: 1.0000 | INHALATION_SPRAY | Freq: Four times a day (QID) | RESPIRATORY_TRACT | 1 refills | Status: DC | PRN

## 2019-07-09 NOTE — Progress Notes (Signed)
Established Patient Office Visit  Subjective:  Patient ID: Natalie Chase, female    DOB: [birth year]  Age: 39 y.o. MRN: [id number]  CC:  Chief Complaint  Patient presents with  . Hypertension    HPI Natalie Chase presents for follow up care. She was last seen 10 months ago . Last month she delivered a 8 lbs baby boy. She feels something not right but not sure what but being followed by a therapist. Denies shortness of breath, headaches, chest pain or lower extremity edema, sudden onset, vision changes, unilateral weakness, dizziness, paresthesias   Past Medical History:  Diagnosis Date  . Asthma   . Hypertension   . Lupus (systemic lupus erythematosus) (HCC)   . Sickle cell trait (HCC)   . Vaginal Pap smear, abnormal     Past Surgical History:  Procedure Laterality Date  . CHOLECYSTECTOMY      Family History  Problem Relation Age of Onset  . Achondroplasia Mother   . Lupus Mother   . Sickle cell anemia Mother   . Hypertension Father   . Stroke Father   . Cancer Other   . Heart disease Maternal Grandmother     Social History   Socioeconomic History  . Marital status: Single    Spouse name: Not on file  . Number of children: 1  . Years of education: [employer]  . Highest education level: Some college, no degree  Occupational History  . Not on file  Tobacco Use  . Smoking status: Former Smoker    Packs/day: 0.00    Types: Cigarettes    Quit date: 01/09/2017    Years since quitting: 2.4  . Smokeless tobacco: Former Engineer, water and Sexual Activity  . Alcohol use: Not Currently  . Drug use: Not Currently    Types: Marijuana    Comment: Stopped August 5th, 2020  . Sexual activity: Yes    Birth control/protection: None  Other Topics Concern  . Not on file  Social History Narrative  . Not on file   Social Determinants of Health   Financial Resource Strain: Medium Risk  . Difficulty of Paying Living Expenses: Somewhat hard  Food Insecurity: No Food  Insecurity  . Worried About Programme researcher, broadcasting/film/video in the Last Year: Never true  . Ran Out of Food in the Last Year: Never true  Transportation Needs: No Transportation Needs  . Lack of Transportation (Medical): No  . Lack of Transportation (Non-Medical): No  Physical Activity: Sufficiently Active  . Days of Exercise per Week: 3 days  . Minutes of Exercise per Session: 60 min  Stress: Stress Concern Present  . Feeling of Stress : To some extent  Social Connections: Somewhat Isolated  . Frequency of Communication with Friends and Family: More than three times a week  . Frequency of Social Gatherings with Friends and Family: More than three times a week  . Attends Religious Services: 1 to 4 times per year  . Active Member of Clubs or Organizations: No  . Attends Banker Meetings: Never  . Marital Status: Never married  Intimate Partner Violence: Not At Risk  . Fear of Current or Ex-Partner: No  . Emotionally Abused: No  . Physically Abused: No  . Sexually Abused: No    Outpatient Medications Prior to Visit  Medication Sig Dispense Refill  . acetaminophen (TYLENOL) 325 MG tablet Take 2 tablets (650 mg total) by mouth every 4 (four) hours as needed (for pain scale <  4). 30 tablet 0  . Cholecalciferol (VITAMIN D3) 50 MCG (2000 UT) TABS Take 1 capsule by mouth daily.    . cyclobenzaprine (FLEXERIL) 10 MG tablet Take 1 tablet (10 mg total) by mouth every 8 (eight) hours as needed for muscle spasms. 30 tablet 1  . hydroxychloroquine (PLAQUENIL) 200 MG tablet Take 400 mg by mouth daily.     Marland Kitchen ibuprofen (ADVIL) 600 MG tablet Take 1 tablet (600 mg total) by mouth every 6 (six) hours. 30 tablet 0  . NIFEdipine (PROCARDIA XL/NIFEDICAL XL) 60 MG 24 hr tablet Take 1 tablet (60 mg total) by mouth daily. 30 tablet 1  . norethindrone (MICRONOR) 0.35 MG tablet Take 1 tablet (0.35 mg total) by mouth daily. 3 Package 4  . Prenatal Vit-Fe Fumarate-FA (NIVA-PLUS) 27-1 MG TABS Take 1 tablet by  mouth once daily 30 tablet 0  . senna-docusate (SENOKOT-S) 8.6-50 MG tablet Take 2 tablets by mouth daily. (Patient not taking: Reported on 06/09/2019) 30 tablet 0  . sertraline (ZOLOFT) 50 MG tablet Take 1 tablet (50 mg total) by mouth daily. 30 tablet 3  . albuterol (PROVENTIL HFA;VENTOLIN HFA) 108 (90 Base) MCG/ACT inhaler Inhale 1-2 puffs into the lungs every 6 (six) hours as needed for wheezing or shortness of breath. 1 Inhaler 0   No facility-administered medications prior to visit.    No Known Allergies  ROS Review of Systems  Psychiatric/Behavioral: Positive for agitation. The patient is nervous/anxious.   All other systems reviewed and are negative.     Objective:    Physical Exam  Constitutional: She is oriented to person, place, and time. She appears well-developed and well-nourished.  HENT:  Head: Normocephalic.  Eyes: Pupils are equal, round, and reactive to light. EOM are normal.  Cardiovascular: Normal rate and regular rhythm.  Pulmonary/Chest: Effort normal and breath sounds normal.  Abdominal: Soft. Bowel sounds are normal.  Musculoskeletal:        General: Normal range of motion.     Cervical back: Normal range of motion and neck supple.  Neurological: She is oriented to person, place, and time.  Skin: Skin is warm and dry.  Psychiatric: She has a normal mood and affect. Her behavior is normal. Judgment and thought content normal.    BP 124/77   Pulse 78   Temp (!) 97.3 F (36.3 C)   Resp 16   Wt 209 lb 9.6 oz (95.1 kg)   LMP 08/13/2018 (Approximate)   SpO2 97%   BMI 35.42 kg/m  Wt Readings from Last 3 Encounters:  07/09/19 209 lb 9.6 oz (95.1 kg)  06/09/19 207 lb (93.9 kg)  05/15/19 208 lb (94.3 kg)     Health Maintenance Due  Topic Date Due  . COVID-19 Vaccine (1) Never done    There are no preventive care reminders to display for this patient.  Lab Results  Component Value Date   TSH 1.700 09/04/2018   Lab Results  Component Value  Date   WBC 20.5 (H) 05/12/2019   HGB 9.9 (L) 05/12/2019   HCT 30.6 (L) 05/12/2019   MCV 82.7 05/12/2019   PLT 272 05/12/2019   Lab Results  Component Value Date   NA 139 03/13/2019   K 3.8 03/13/2019   CO2 22 03/13/2019   GLUCOSE 99 03/13/2019   BUN 8 03/13/2019   CREATININE 1.03 (H) 03/13/2019   BILITOT <0.2 03/13/2019   ALKPHOS 101 03/13/2019   AST 22 03/13/2019   ALT 27 03/13/2019   PROT  6.4 03/13/2019   ALBUMIN 3.7 (L) 03/13/2019   CALCIUM 9.2 03/13/2019   ANIONGAP 10 10/28/2018   Lab Results  Component Value Date   CHOL 133 07/17/2017   Lab Results  Component Value Date   HDL 39 (L) 07/17/2017   Lab Results  Component Value Date   LDLCALC 74 07/17/2017   Lab Results  Component Value Date   TRIG 101 07/17/2017   Lab Results  Component Value Date   CHOLHDL 3.4 07/17/2017   Lab Results  Component Value Date   HGBA1C 5.3 11/14/2018      Assessment & Plan:  Natalie Chase was seen today for hypertension.  Diagnoses and all orders for this visit:  Essential hypertension Controlled on Nifedipine 60mg /XL daily. We have discussed target BP range and blood pressure goal. I have advised patient to check BP regularly and to call us back or report to clinic if the numbers are consistently higher than 140/90. We discussed the importance of compliance with medical therapy and DASH diet recommended, consequences of uncontrolled hypertension discussed.   Post partum depression Not really able to explain what she was feeling but felt it was not right and is being followed by a therapist at this time .  Breast feeding problem in infant Medication must be considered before prescribing patient requested Plaquenil I have denied refilled. She must call the original pre scriber.   Meds ordered this encounter  Medications  . albuterol (VENTOLIN HFA) 108 (90 Base) MCG/ACT inhaler    Sig: Inhale 1-2 puffs into the lungs every 6 (six) hours as needed for wheezing or shortness of  breath.    Dispense:  18 g    Refill:  1    Follow-up: Return in about 3 months (around 10/09/2019) for BP in person.    Kerin Perna, NP

## 2019-07-09 NOTE — Patient Instructions (Signed)

## 2019-07-10 ENCOUNTER — Other Ambulatory Visit (INDEPENDENT_AMBULATORY_CARE_PROVIDER_SITE_OTHER): Payer: Self-pay | Admitting: Primary Care

## 2019-07-10 MED ORDER — ALBUTEROL SULFATE HFA 108 (90 BASE) MCG/ACT IN AERS: 1.0000 | INHALATION_SPRAY | Freq: Four times a day (QID) | RESPIRATORY_TRACT | 1 refills | Status: DC | PRN

## 2019-08-05 NOTE — Progress Notes (Signed)
Return to work note printed for patient.

## 2019-08-11 ENCOUNTER — Encounter: Payer: Medicaid Other | Admitting: Licensed Clinical Social Worker

## 2019-09-29 ENCOUNTER — Telehealth (INDEPENDENT_AMBULATORY_CARE_PROVIDER_SITE_OTHER): Payer: Self-pay

## 2019-09-29 ENCOUNTER — Telehealth (INDEPENDENT_AMBULATORY_CARE_PROVIDER_SITE_OTHER): Payer: Medicaid Other | Admitting: Primary Care

## 2019-09-29 NOTE — Telephone Encounter (Signed)
Advised patient to contact [facility] as she is already a patient there.

## 2019-09-29 NOTE — Telephone Encounter (Signed)
Patient would like to be referred to an OB-Gyn for her uncontrolled bleeding.  Please advice [phone]

## 2019-10-08 ENCOUNTER — Other Ambulatory Visit: Payer: Self-pay

## 2019-10-08 ENCOUNTER — Ambulatory Visit (INDEPENDENT_AMBULATORY_CARE_PROVIDER_SITE_OTHER): Payer: Medicaid Other | Admitting: Licensed Clinical Social Worker

## 2019-10-08 ENCOUNTER — Ambulatory Visit (INDEPENDENT_AMBULATORY_CARE_PROVIDER_SITE_OTHER): Payer: Medicaid Other | Admitting: Obstetrics and Gynecology

## 2019-10-08 ENCOUNTER — Encounter: Payer: Self-pay | Admitting: Obstetrics and Gynecology

## 2019-10-08 VITALS — BP 159/94 | HR 75 | Temp 98.4°F | Wt 212.4 lb

## 2019-10-08 DIAGNOSIS — N939 Abnormal uterine and vaginal bleeding, unspecified: Secondary | ICD-10-CM | POA: Diagnosis not present

## 2019-10-08 DIAGNOSIS — F53 Postpartum depression: Secondary | ICD-10-CM

## 2019-10-08 DIAGNOSIS — I10 Essential (primary) hypertension: Secondary | ICD-10-CM

## 2019-10-08 DIAGNOSIS — O99345 Other mental disorders complicating the puerperium: Secondary | ICD-10-CM

## 2019-10-08 DIAGNOSIS — F4321 Adjustment disorder with depressed mood: Secondary | ICD-10-CM

## 2019-10-08 MED ORDER — NIVA-PLUS 27-1 MG PO TABS: 1.0000 | ORAL_TABLET | Freq: Every day | ORAL | 12 refills | Status: DC

## 2019-10-08 NOTE — Progress Notes (Signed)
  GYNECOLOGY PROGRESS NOTE  History:  Ms. Natalie Chase is a 39 y.o. [id number] presents to [facility] office today for problem gyn visit. She reports heavy VB "for the entire month of July until a few days ago. She reports the VB stopped once she started taking the PoPs that were previously prescribed to her Sunday 10/05/2019. She reports continued depression, but "just started" taking Zoloft (that was previously prescribed) 2 days ago.  She denies h/a, dizziness, shortness of breath, n/v, or fever/chills.    The following portions of the patient's history were reviewed and updated as appropriate: allergies, current medications, past family history, past medical history, past social history, past surgical history and problem list. Last pap smear on 06/11/2017 was normal, Negative HRHPV.  Review of Systems:  Pertinent items are noted in HPI.   Objective:  Physical Exam Blood pressure (!) 159/94, pulse 75, temperature 98.4 F (36.9 C), temperature source Oral, weight 212 lb 6.4 oz (96.3 kg), currently breastfeeding. VS reviewed, nursing note reviewed,  Constitutional: well developed, well nourished, no distress HEENT: normocephalic CV: normal rate Pulm/chest wall: normal effort Breast Exam: deferred Abdomen: soft Neuro: alert and oriented x 3 Skin: warm, dry Psych: affect depressed Pelvic exam: deferred  Assessment & Plan:  Abnormal uterine bleeding (AUB) - Extensive discussion on AUB - Explained to patient that starting on PoPs may have solved her problem.  - Advised to call back, if AUB returns - Not advised to switch BC method d/t cHTN and hyperlipidemia  Chronic hypertension - Continue management with PCP  Adjustment disorder with depressed mood - Scheduled to see Gwyndolyn Saxon, LCSW this AM - Advised that she is not likely to see any results from Zoloft for 2 wks after starting  Raelyn Mora, CNM 9:10 AM

## 2019-10-08 NOTE — BH Specialist Note (Signed)
Integrated Behavioral Health Follow Up Visit  MRN: [id number] Name: Natalie Chase  Number of Integrated Behavioral Health Clinician visits: 2 Session Start time: 10:03am  Session End time: 10:27am Total time: 24 mins in person at [facility]   Type of Service: Integrated Behavioral Health- Individual Interpretor:no  Interpretor Name and Language: none   SUBJECTIVE: Natalie Chase is a 39 y.o. female accompanied by n/a Patient was referred by Darcella Cheshire RN for depression  Patient reports the following symptoms/concerns: feelings of worthlessness, social isolation, loss of interest, sleep deprivation, feelings of guilt  Duration of problem: approx 6 months ; Severity of problem: moderate   OBJECTIVE: Mood:  and Affect:  Risk of harm to self or others: Natalie Chase denies risk of harm to self or others   LIFE CONTEXT: Family and Social: Lives with two children in [place]. FOB (newborn) does not live in the home.  School/Work: Natalie Chase works at [employer] [place]  Self-Care: n/a Life Changes: Newborn son   GOALS ADDRESSED: Patient will: 1.  Reduce symptoms of: depression   2.  Increase knowledge of diagnosis and implement copings skills to alleviate symptoms   3.  Demonstrate ability to: self manage symptoms   INTERVENTIONS: Interventions utilized:  Supportive counseling  Standardized Assessments completed: Will complete phq9 on 10/22/2019       ASSESSMENT: Patient currently experiencing postpartum depression   Patient may benefit from integrated behavioral health   PLAN: 1. Follow up with behavioral health clinician on : two weeks virtual  2. Behavioral recommendations: Take prescribed medication, engage in self care techniques, prioritize sleep 3. Referral(s): n/a 4. "From scale of 1-10, how likely are you to follow plan?":   Gwyndolyn Saxon, LCSW

## 2019-10-09 ENCOUNTER — Ambulatory Visit (INDEPENDENT_AMBULATORY_CARE_PROVIDER_SITE_OTHER): Payer: Medicaid Other | Admitting: Primary Care

## 2019-10-10 ENCOUNTER — Encounter: Payer: Self-pay | Admitting: Obstetrics and Gynecology

## 2019-10-22 ENCOUNTER — Encounter: Payer: Medicaid Other | Admitting: Licensed Clinical Social Worker

## 2019-11-06 ENCOUNTER — Other Ambulatory Visit: Payer: Self-pay

## 2019-11-06 ENCOUNTER — Ambulatory Visit (INDEPENDENT_AMBULATORY_CARE_PROVIDER_SITE_OTHER): Payer: Medicaid Other | Admitting: Licensed Clinical Social Worker

## 2019-11-06 DIAGNOSIS — F53 Postpartum depression: Secondary | ICD-10-CM

## 2019-11-06 DIAGNOSIS — O99345 Other mental disorders complicating the puerperium: Secondary | ICD-10-CM | POA: Diagnosis not present

## 2019-11-07 NOTE — BH Specialist Note (Signed)
Integrated Behavioral Health Initial Visit  MRN: [id number] Name: Natalie Chase  Number of Integrated Behavioral Health Clinician visits:: 3 Session Start time: 3:23pm  Session End time: 4:07pm Total time: 44 mins in person   Type of Service: Integrated Behavioral Health- Individual Interpretor:no Interpretor Name and Language: none   Warm Hand Off Completed.       SUBJECTIVE: Natalie Chase is a 39 y.o. female accompanied by n/a Patient was referred by Darcella Cheshire RN for for depression. Patient reports the following improvement in mood, increase social interaction Duration of problem:approx 8 months  ; Severity of problem: mild  OBJECTIVE: Mood:Good  and Affect: Normal  Risk of harm to self or others: No risk of harm to self or other.s   LIFE CONTEXT: Family and Social: Lives with children in [place]  School/Work: Works in [place] and with [employer]. Pt reports working two jobs  Self-Care: physical activity  Life Changes: Started taking zoloft and reports noticing improvement   GOALS ADDRESSED: Patient will: 1. Reduce symptoms of:  2. Increase knowledge and/or ability of:   3. Demonstrate ability to:   INTERVENTIONS: Interventions utilized: supportive counseling   Standardized Assessments completed:    Initial Prenatal from 11/14/2018 in [facility]  PHQ-9 Total Score 6       ASSESSMENT: Patient currently experiencing postpartum depression    Patient may benefit from integrated behavioral health   PLAN: 1. Follow up with behavioral health clinician on : 11/27/2019 2. Behavioral recommendations: Continue taking prescribed medication as directed, increase social interaction and continue prioritizing rest  3. Referral(s):  4. "From scale of 1-10, how likely are you to follow plan?":   Natalie Saxon, LCSW

## 2019-11-27 ENCOUNTER — Other Ambulatory Visit: Payer: Self-pay

## 2019-11-27 ENCOUNTER — Ambulatory Visit (INDEPENDENT_AMBULATORY_CARE_PROVIDER_SITE_OTHER): Payer: Medicaid Other | Admitting: Licensed Clinical Social Worker

## 2019-11-27 DIAGNOSIS — F53 Postpartum depression: Secondary | ICD-10-CM

## 2019-11-27 DIAGNOSIS — O99345 Other mental disorders complicating the puerperium: Secondary | ICD-10-CM

## 2019-11-27 NOTE — BH Specialist Note (Signed)
Integrated Behavioral Health Follow Up Visit  MRN: [id number] Name: Natalie Chase  Number of Integrated Behavioral Health Clinician visits: 4 Session Start time: 1:25pm Session End time: 2:20pm Total time: 55 mins in person at [facility]   Type of Service: Integrated Behavioral Health- Individual Interpretor:no Interpretor Name and Language: None   SUBJECTIVE: Natalie Chase is a 39 y.o. female accompanied by n/a Patient was referred by Darcella Cheshire RN for depression. Patient reports the following symptoms/concerns: depressed mood, overwhelmed, difficulty staying asleep. Duration of problem: approx 8-64months; Severity of problem: mild  OBJECTIVE: Mood: good  and Affect: normal  Risk of harm to self or others: No risk of harm to self or others.   LIFE CONTEXT: Family and Social: Lives with children in [place]  School/Work: Ms. Duncanson reports working two jobs and considering pickin up a third job to catch up on bills.  Self-Care: n/a Life Changes: n/a  GOALS ADDRESSED: Patient will: 1.  Reduce symptoms of: postpartum depression 2.  Increase knowledge of intervention and implement coping skills to alleviate symptoms 3.  Demonstrate ability to: self manage symptoms  INTERVENTIONS: Interventions utilized:  Supportive counseling  Standardized Assessments completed:    Initial Prenatal from 11/14/2018 in [facility]  PHQ-9 Total Score 6      ASSESSMENT: Patient currently experiencing postpartum depression  Patient may benefit from integrated behavioral health  PLAN: 1. Follow up with behavioral health clinician on : 12/11/2019 2. Behavioral recommendations: Engage in self care activities, manage blood pressure with collaboration with primary care physician, mindfulness and meditation to alleviate stress  3. Referral(s): n/a 4. "From scale of 1-10, how likely are you to follow plan?":   Gwyndolyn Saxon, LCSW

## 2019-12-11 ENCOUNTER — Institutional Professional Consult (permissible substitution): Payer: Medicaid Other | Admitting: Licensed Clinical Social Worker

## 2020-02-05 ENCOUNTER — Other Ambulatory Visit: Payer: Self-pay

## 2020-02-05 ENCOUNTER — Ambulatory Visit (INDEPENDENT_AMBULATORY_CARE_PROVIDER_SITE_OTHER): Payer: Medicaid Other | Admitting: Primary Care

## 2020-02-05 ENCOUNTER — Encounter (INDEPENDENT_AMBULATORY_CARE_PROVIDER_SITE_OTHER): Payer: Self-pay | Admitting: Primary Care

## 2020-02-05 VITALS — BP 150/90 | HR 94 | Temp 95.4°F | Ht 64.5 in | Wt 216.4 lb

## 2020-02-05 DIAGNOSIS — I10 Essential (primary) hypertension: Secondary | ICD-10-CM

## 2020-02-05 DIAGNOSIS — Z131 Encounter for screening for diabetes mellitus: Secondary | ICD-10-CM

## 2020-02-05 DIAGNOSIS — D509 Iron deficiency anemia, unspecified: Secondary | ICD-10-CM

## 2020-02-05 LAB — POCT GLYCOSYLATED HEMOGLOBIN (HGB A1C): Hemoglobin A1C: 5.6 % (ref 4.0–5.6)

## 2020-02-05 MED ORDER — LISINOPRIL-HYDROCHLOROTHIAZIDE 20-25 MG PO TABS: 1.0000 | ORAL_TABLET | Freq: Every day | ORAL | 3 refills | Status: DC

## 2020-02-05 NOTE — Patient Instructions (Signed)
   Managing Your Hypertension Hypertension is commonly called high blood pressure. This is when the force of your blood pressing against the walls of your arteries is too strong. Arteries are blood vessels that carry blood from your heart throughout your body. Hypertension forces the heart to work harder to pump blood, and may cause the arteries to become narrow or stiff. Having untreated or uncontrolled hypertension can cause heart attack, stroke, kidney disease, and other problems. What are blood pressure readings? A blood pressure reading consists of a higher number over a lower number. Ideally, your blood pressure should be below 120/80. The first ("top") number is called the systolic pressure. It is a measure of the pressure in your arteries as your heart beats. The second ("bottom") number is called the diastolic pressure. It is a measure of the pressure in your arteries as the heart relaxes. What does my blood pressure reading mean? Blood pressure is classified into four stages. Based on your blood pressure reading, your health care provider may use the following stages to determine what type of treatment you need, if any. Systolic pressure and diastolic pressure are measured in a unit called mm Hg. Normal  Systolic pressure: below 120.  Diastolic pressure: below 80. Elevated  Systolic pressure: 120-129.  Diastolic pressure: below 80. Hypertension stage 1  Systolic pressure: 130-139.  Diastolic pressure: 80-89. Hypertension stage 2  Systolic pressure: 140 or above.  Diastolic pressure: 90 or above. What health risks are associated with hypertension? Managing your hypertension is an important responsibility. Uncontrolled hypertension can lead to:  A heart attack.  A stroke.  A weakened blood vessel (aneurysm).  Heart failure.  Kidney damage.  Eye damage.  Metabolic syndrome.  Memory and concentration problems. What changes can I make to manage my  hypertension? Hypertension can be managed by making lifestyle changes and possibly by taking medicines. Your health care provider will help you make a plan to bring your blood pressure within a normal range. Eating and drinking   Eat a diet that is high in fiber and potassium, and low in salt (sodium), added sugar, and fat. An example eating plan is called the DASH (Dietary Approaches to Stop Hypertension) diet. To eat this way: ? Eat plenty of fresh fruits and vegetables. Try to fill half of your plate at each meal with fruits and vegetables. ? Eat whole grains, such as whole wheat pasta, brown rice, or whole grain bread. Fill about one quarter of your plate with whole grains. ? Eat low-fat diary products. ? Avoid fatty cuts of meat, processed or cured meats, and poultry with skin. Fill about one quarter of your plate with lean proteins such as fish, chicken without skin, beans, eggs, and tofu. ? Avoid premade and processed foods. These tend to be higher in sodium, added sugar, and fat.  Reduce your daily sodium intake. Most people with hypertension should eat less than 1,500 mg of sodium a day.  Limit alcohol intake to no more than 1 drink a day for nonpregnant women and 2 drinks a day for men. One drink equals 12 oz of beer, 5 oz of wine, or 1 oz of hard liquor. Lifestyle  Work with your health care provider to maintain a healthy body weight, or to lose weight. Ask what an ideal weight is for you.  Get at least 30 minutes of exercise that causes your heart to beat faster (aerobic exercise) most days of the week. Activities may include walking, swimming, or biking.    Include exercise to strengthen your muscles (resistance exercise), such as weight lifting, as part of your weekly exercise routine. Try to do these types of exercises for 30 minutes at least 3 days a week.  Do not use any products that contain nicotine or tobacco, such as cigarettes and e-cigarettes. If you need help quitting,  ask your health care provider.  Control any long-term (chronic) conditions you have, such as high cholesterol or diabetes. Monitoring  Monitor your blood pressure at home as told by your health care provider. Your personal target blood pressure may vary depending on your medical conditions, your age, and other factors.  Have your blood pressure checked regularly, as often as told by your health care provider. Working with your health care provider  Review all the medicines you take with your health care provider because there may be side effects or interactions.  Talk with your health care provider about your diet, exercise habits, and other lifestyle factors that may be contributing to hypertension.  Visit your health care provider regularly. Your health care provider can help you create and adjust your plan for managing hypertension. Will I need medicine to control my blood pressure? Your health care provider may prescribe medicine if lifestyle changes are not enough to get your blood pressure under control, and if:  Your systolic blood pressure is 130 or higher.  Your diastolic blood pressure is 80 or higher. Take medicines only as told by your health care provider. Follow the directions carefully. Blood pressure medicines must be taken as prescribed. The medicine does not work as well when you skip doses. Skipping doses also puts you at risk for problems. Contact a health care provider if:  You think you are having a reaction to medicines you have taken.  You have repeated (recurrent) headaches.  You feel dizzy.  You have swelling in your ankles.  You have trouble with your vision. Get help right away if:  You develop a severe headache or confusion.  You have unusual weakness or numbness, or you feel faint.  You have severe pain in your chest or abdomen.  You vomit repeatedly.  You have trouble breathing. Summary  Hypertension is when the force of blood pumping  through your arteries is too strong. If this condition is not controlled, it may put you at risk for serious complications.  Your personal target blood pressure may vary depending on your medical conditions, your age, and other factors. For most people, a normal blood pressure is less than 120/80.  Hypertension is managed by lifestyle changes, medicines, or both. Lifestyle changes include weight loss, eating a healthy, low-sodium diet, exercising more, and limiting alcohol. This information is not intended to replace advice given to you by your health care provider. Make sure you discuss any questions you have with your health care provider. Document Revised: 06/07/2018 Document Reviewed: 01/12/2016 Elsevier Patient Education  2020 [employer]  

## 2020-02-05 NOTE — Progress Notes (Signed)
Established Patient Office Visit  Subjective:  Patient ID: Natalie Chase, female    DOB: 05-09-1980  Age: 39 y.o. MRN: [id number]  CC:  Chief Complaint  Patient presents with  . Hypertension    HPI Natalie Chase is 39 year old female who presents for hypertension management . Denies shortness of breath, headaches, chest pain or lower extremity edema  Past Medical History:  Diagnosis Date  . Asthma   . Hypertension   . Lupus (systemic lupus erythematosus) ([place])   . Sickle cell trait ([place])   . Vaginal Pap smear, abnormal     Past Surgical History:  Procedure Laterality Date  . CHOLECYSTECTOMY      Family History  Problem Relation Age of Onset  . Achondroplasia Mother   . Lupus Mother   . Sickle cell anemia Mother   . Hypertension Father   . Stroke Father   . Cancer Other   . Heart disease Maternal Grandmother     Social History   Socioeconomic History  . Marital status: Single    Spouse name: Not on file  . Number of children: 1  . Years of education: [employer]  . Highest education level: Some college, no degree  Occupational History  . Not on file  Tobacco Use  . Smoking status: Former Smoker    Packs/day: 0.00    Types: Cigarettes    Quit date: 01/09/2017    Years since quitting: 3.0  . Smokeless tobacco: Former Network engineer  . Vaping Use: Never used  Substance and Sexual Activity  . Alcohol use: Not Currently  . Drug use: Not Currently    Types: Marijuana    Comment: Stopped August 5th, 2020  . Sexual activity: Yes    Birth control/protection: Pill  Other Topics Concern  . Not on file  Social History Narrative  . Not on file   Social Determinants of Health   Financial Resource Strain: Not on file  Food Insecurity: Not on file  Transportation Needs: Not on file  Physical Activity: Not on file  Stress: Not on file  Social Connections: Not on file  Intimate Partner Violence: Not on file    Outpatient Medications Prior to Visit   Medication Sig Dispense Refill  . acetaminophen (TYLENOL) 325 MG tablet Take 2 tablets (650 mg total) by mouth every 4 (four) hours as needed (for pain scale < 4). 30 tablet 0  . albuterol (VENTOLIN HFA) 108 (90 Base) MCG/ACT inhaler Inhale 1-2 puffs into the lungs every 6 (six) hours as needed for wheezing or shortness of breath. 18 g 1  . Cholecalciferol (VITAMIN D3) 50 MCG (2000 UT) TABS Take 1 capsule by mouth daily.    . hydroxychloroquine (PLAQUENIL) 200 MG tablet Take 400 mg by mouth daily.     Marland Kitchen ibuprofen (ADVIL) 600 MG tablet Take 1 tablet (600 mg total) by mouth every 6 (six) hours. 30 tablet 0  . NIFEdipine (PROCARDIA XL/NIFEDICAL XL) 60 MG 24 hr tablet Take 1 tablet (60 mg total) by mouth daily. 90 tablet 1  . sertraline (ZOLOFT) 50 MG tablet Take 1 tablet (50 mg total) by mouth daily. 30 tablet 3  . norethindrone (MICRONOR) 0.35 MG tablet Take 1 tablet (0.35 mg total) by mouth daily. 3 Package 4  . Prenatal Vit-Fe Fumarate-FA (NIVA-PLUS) 27-1 MG TABS Take 1 tablet by mouth daily. (Patient not taking: Reported on 02/05/2020) 30 tablet 12  . cyclobenzaprine (FLEXERIL) 10 MG tablet Take 1 tablet (  10 mg total) by mouth every 8 (eight) hours as needed for muscle spasms. 30 tablet 1   No facility-administered medications prior to visit.    No Known Allergies  ROS Review of Systems  Constitutional: Positive for chills.  All other systems reviewed and are negative.     Objective:    Physical Exam Vitals reviewed.  Constitutional:      Appearance: She is obese.  HENT:     Head: Normocephalic.     Nose: Nose normal.  Cardiovascular:     Rate and Rhythm: Normal rate and regular rhythm.  Pulmonary:     Effort: Pulmonary effort is normal.     Breath sounds: Normal breath sounds.  Abdominal:     General: Bowel sounds are normal. There is distension.     Palpations: Abdomen is soft.  Musculoskeletal:        General: Normal range of motion.     Cervical back: Normal range of  motion and neck supple.  Skin:    General: Skin is warm and dry.  Neurological:     Mental Status: She is alert and oriented to person, place, and time.  Psychiatric:        Mood and Affect: Mood normal.        Behavior: Behavior normal.        Thought Content: Thought content normal.        Judgment: Judgment normal.     BP (!) 150/90 (BP Location: Left Arm, Patient Position: Sitting, Cuff Size: Normal)   Pulse 94   Temp (!) 95.4 F (35.2 C) (Temporal)   Ht 5' 4.5" (1.638 m)   Wt 216 lb 6.4 oz (98.2 kg)   LMP 01/13/2020 (Exact Date)   SpO2 100%   Breastfeeding No   BMI 36.57 kg/m  Wt Readings from Last 3 Encounters:  02/05/20 216 lb 6.4 oz (98.2 kg)  10/08/19 212 lb 6.4 oz (96.3 kg)  07/09/19 209 lb 9.6 oz (95.1 kg)     Health Maintenance Due  Topic Date Due  . COVID-19 Vaccine (1) Never done    There are no preventive care reminders to display for this patient.  Lab Results  Component Value Date   TSH 1.700 09/04/2018   Lab Results  Component Value Date   WBC 20.5 (H) 05/12/2019   HGB 9.9 (L) 05/12/2019   HCT 30.6 (L) 05/12/2019   MCV 82.7 05/12/2019   PLT 272 05/12/2019   Lab Results  Component Value Date   NA 139 03/13/2019   K 3.8 03/13/2019   CO2 22 03/13/2019   GLUCOSE 99 03/13/2019   BUN 8 03/13/2019   CREATININE 1.03 (H) 03/13/2019   BILITOT <0.2 03/13/2019   ALKPHOS 101 03/13/2019   AST 22 03/13/2019   ALT 27 03/13/2019   PROT 6.4 03/13/2019   ALBUMIN 3.7 (L) 03/13/2019   CALCIUM 9.2 03/13/2019   ANIONGAP 10 10/28/2018   Lab Results  Component Value Date   CHOL 133 07/17/2017   Lab Results  Component Value Date   HDL 39 (L) 07/17/2017   Lab Results  Component Value Date   LDLCALC 74 07/17/2017   Lab Results  Component Value Date   TRIG 101 07/17/2017   Lab Results  Component Value Date   CHOLHDL 3.4 07/17/2017   Lab Results  Component Value Date   HGBA1C 5.6 02/05/2020      Assessment & Plan:  Natalie Chase was seen  today for hypertension.  Diagnoses and  all orders for this visit:  Screening for diabetes mellitus -     HgB A1c 5.6  Iron deficiency anemia, unspecified iron deficiency anemia type -     CBC with Differential  Primary hypertension Counseled on blood pressure goal of less than 130/80, low-sodium, DASH diet, medication compliance, 150 minutes of moderate intensity exercise per week. Discussed medication compliance, adverse effects. -     lisinopril-hydrochlorothiazide (ZESTORETIC) 20-25 MG tablet; Take 1 tablet by mouth daily. -     CMP14+EGFR   Meds ordered this encounter  Medications  . lisinopril-hydrochlorothiazide (ZESTORETIC) 20-25 MG tablet    Sig: Take 1 tablet by mouth daily.    Dispense:  90 tablet    Refill:  3    Follow-up: Return in about 2 months (around 04/07/2020) for Bp check.    Kerin Perna, NP

## 2020-02-05 NOTE — Progress Notes (Signed)
Patient states she stays extremely cold

## 2020-02-06 LAB — CMP14+EGFR
ALT: 9 IU/L (ref 0–32)
AST: 13 IU/L (ref 0–40)
Albumin/Globulin Ratio: 1.6 (ref 1.2–2.2)
Albumin: 4.2 g/dL (ref 3.8–4.8)
Alkaline Phosphatase: 88 IU/L (ref 44–121)
BUN/Creatinine Ratio: 10 (ref 9–23)
BUN: 8 mg/dL (ref 6–20)
Bilirubin Total: 0.2 mg/dL (ref 0.0–1.2)
CO2: 24 mmol/L (ref 20–29)
Calcium: 8.9 mg/dL (ref 8.7–10.2)
Chloride: 103 mmol/L (ref 96–106)
Creatinine, Ser: 0.78 mg/dL (ref 0.57–1.00)
GFR calc Af Amer: 112 mL/min/{1.73_m2} (ref >59)
GFR calc non Af Amer: 97 mL/min/{1.73_m2} (ref >59)
Globulin, Total: 2.7 g/dL (ref 1.5–4.5)
Glucose: 122 mg/dL — ABNORMAL HIGH (ref 65–99)
Potassium: 3.8 mmol/L (ref 3.5–5.2)
Sodium: 141 mmol/L (ref 134–144)
Total Protein: 6.9 g/dL (ref 6.0–8.5)

## 2020-02-06 LAB — CBC WITH DIFFERENTIAL/PLATELET
Basophils Absolute: 0.1 10*3/uL (ref 0.0–0.2)
Basos: 0 %
EOS (ABSOLUTE): 0.2 10*3/uL (ref 0.0–0.4)
Eos: 2 %
Hematocrit: 40.1 % (ref 34.0–46.6)
Hemoglobin: 12.9 g/dL (ref 11.1–15.9)
Immature Grans (Abs): 0 10*3/uL (ref 0.0–0.1)
Immature Granulocytes: 0 %
Lymphocytes Absolute: 2.8 10*3/uL (ref 0.7–3.1)
Lymphs: 22 %
MCH: 24.7 pg — ABNORMAL LOW (ref 26.6–33.0)
MCHC: 32.2 g/dL (ref 31.5–35.7)
MCV: 77 fL — ABNORMAL LOW (ref 79–97)
Monocytes Absolute: 0.7 10*3/uL (ref 0.1–0.9)
Monocytes: 5 %
Neutrophils Absolute: 9.2 10*3/uL — ABNORMAL HIGH (ref 1.4–7.0)
Neutrophils: 71 %
Platelets: 389 10*3/uL (ref 150–450)
RBC: 5.23 x10E6/uL (ref 3.77–5.28)
RDW: 17 % — ABNORMAL HIGH (ref 11.7–15.4)
WBC: 12.9 10*3/uL — ABNORMAL HIGH (ref 3.4–10.8)

## 2020-02-18 ENCOUNTER — Ambulatory Visit (INDEPENDENT_AMBULATORY_CARE_PROVIDER_SITE_OTHER): Payer: Medicaid Other | Admitting: Obstetrics and Gynecology

## 2020-02-18 ENCOUNTER — Ambulatory Visit: Payer: Medicaid Other | Admitting: Licensed Clinical Social Worker

## 2020-02-18 ENCOUNTER — Other Ambulatory Visit (HOSPITAL_COMMUNITY)
Admission: RE | Admit: 2020-02-18 | Discharge: 2020-02-18 | Disposition: A | Discharge status: Home / Self Care | Payer: Medicaid Other | Source: Ambulatory Visit | Attending: Obstetrics and Gynecology | Admitting: Obstetrics and Gynecology

## 2020-02-18 ENCOUNTER — Other Ambulatory Visit: Payer: Self-pay

## 2020-02-18 ENCOUNTER — Encounter: Payer: Self-pay | Admitting: Obstetrics and Gynecology

## 2020-02-18 VITALS — BP 155/92 | HR 87 | Temp 98.2°F | Ht 65.0 in | Wt 207.4 lb

## 2020-02-18 DIAGNOSIS — N760 Acute vaginitis: Secondary | ICD-10-CM | POA: Diagnosis not present

## 2020-02-18 DIAGNOSIS — B9689 Other specified bacterial agents as the cause of diseases classified elsewhere: Secondary | ICD-10-CM | POA: Insufficient documentation

## 2020-02-18 DIAGNOSIS — Z01419 Encounter for gynecological examination (general) (routine) without abnormal findings: Secondary | ICD-10-CM | POA: Insufficient documentation

## 2020-02-18 DIAGNOSIS — Z113 Encounter for screening for infections with a predominantly sexual mode of transmission: Secondary | ICD-10-CM | POA: Insufficient documentation

## 2020-02-18 DIAGNOSIS — L680 Hirsutism: Secondary | ICD-10-CM

## 2020-02-18 DIAGNOSIS — Z3040 Encounter for surveillance of contraceptives, unspecified: Secondary | ICD-10-CM

## 2020-02-18 DIAGNOSIS — I10 Essential (primary) hypertension: Secondary | ICD-10-CM

## 2020-02-18 DIAGNOSIS — N898 Other specified noninflammatory disorders of vagina: Secondary | ICD-10-CM | POA: Diagnosis not present

## 2020-02-18 MED ORDER — NIVA-PLUS 27-1 MG PO TABS
1.0000 | ORAL_TABLET | Freq: Every day | ORAL | 12 refills | Status: DC
Start: 2020-02-18 — End: 2020-08-25

## 2020-02-18 MED ORDER — NORETHINDRONE 0.35 MG PO TABS: 1.0000 | ORAL_TABLET | Freq: Every day | ORAL | 11 refills | Status: DC

## 2020-02-18 NOTE — Progress Notes (Addendum)
GYNECOLOGY CLINIC ANNUAL PREVENTATIVE CARE ENCOUNTER NOTE  Subjective:   Natalie Chase is a 39 y.o. [phone] female here for a routine annual gynecologic exam.  Current complaints: vaginal discharge/odor, would like her hormone levels checked, and reports hair growing in the middle of her chest between her breast and underneath her chin. Pt reports decreased vaginal lubrication.  Pt endorses irregular cycles with clots during her cycles. Denies abnormal vaginal bleeding, pelvic pain, problems with intercourse or other gynecologic concerns.    Gynecologic History Patient's last menstrual period was 02/08/2020 (exact date). Contraception: oral progesterone-only contraceptive Last Pap: 06/11/2017. Results were: normal Last mammogram: n/a  Obstetric History OB History  Gravida Para Term Preterm AB Living  3 2 2   1 2   SAB IAB Ectopic Multiple Live Births    1   0 2    # Outcome Date GA Lbr Len/2nd Weight Sex Delivery Anes PTL Lv  3 Term 05/11/19 [redacted]w[redacted]d 04:00 / 00:53 3215 g M Vag-Spont EPI  LIV  2 Term 11/30/02 [redacted]w[redacted]d  3118 g M Vag-Spont   LIV  1 IAB             Past Medical History:  Diagnosis Date  . Asthma   . Hypertension   . Lupus (systemic lupus erythematosus) ([facility])   . Sickle cell trait ([facility])   . Vaginal Pap smear, abnormal     Past Surgical History:  Procedure Laterality Date  . CHOLECYSTECTOMY      Current Outpatient Medications on File Prior to Visit  Medication Sig Dispense Refill  . acetaminophen (TYLENOL) 325 MG tablet Take 2 tablets (650 mg total) by mouth every 4 (four) hours as needed (for pain scale < 4). 30 tablet 0  . albuterol (VENTOLIN HFA) 108 (90 Base) MCG/ACT inhaler Inhale 1-2 puffs into the lungs every 6 (six) hours as needed for wheezing or shortness of breath. 18 g 1  . Cholecalciferol (VITAMIN D3) 50 MCG (2000 UT) TABS Take 1 capsule by mouth daily.    . hydroxychloroquine (PLAQUENIL) 200 MG tablet Take 400 mg by mouth daily.     [redacted]w[redacted]d ibuprofen  (ADVIL) 600 MG tablet Take 1 tablet (600 mg total) by mouth every 6 (six) hours. 30 tablet 0  . lisinopril-hydrochlorothiazide (ZESTORETIC) 20-25 MG tablet Take 1 tablet by mouth daily. 90 tablet 3  . NIFEdipine (PROCARDIA XL/NIFEDICAL XL) 60 MG 24 hr tablet Take 1 tablet (60 mg total) by mouth daily. 90 tablet 1  . sertraline (ZOLOFT) 50 MG tablet Take 1 tablet (50 mg total) by mouth daily. 30 tablet 3   No current facility-administered medications on file prior to visit.    No Known Allergies  Social History   Socioeconomic History  . Marital status: Single    Spouse name: Not on file  . Number of children: 2  . Years of education: Marland Kitchen  . Highest education level: Some college, no degree  Occupational History  . Not on file  Tobacco Use  . Smoking status: Former Smoker    Packs/day: 0.00    Types: Cigarettes    Quit date: 01/09/2017    Years since quitting: 3.1  . Smokeless tobacco: Former 01/11/2017  . Vaping Use: Never used  Substance and Sexual Activity  . Alcohol use: Not Currently  . Drug use: Not Currently    Types: Marijuana    Comment: Stopped August 5th, 2020  . Sexual activity: Yes    Birth control/protection: Pill  Other Topics Concern  . Not on file  Social History Narrative  . Not on file   Social Determinants of Health   Financial Resource Strain: Not on file  Food Insecurity: Not on file  Transportation Needs: Not on file  Physical Activity: Not on file  Stress: Not on file  Social Connections: Not on file  Intimate Partner Violence: Not on file    Family History  Problem Relation Age of Onset  . Achondroplasia Mother   . Lupus Mother   . Sickle cell anemia Mother   . Hypertension Father   . Stroke Father   . Cancer Other   . Heart disease Maternal Grandmother     The following portions of the patient's history were reviewed and updated as appropriate: allergies, current medications, past family history, past medical  history, past social history, past surgical history and problem list.  Review of Systems Pertinent items are noted in HPI. Pertinent items noted in HPI and remainder of comprehensive ROS otherwise negative.   Objective:  BP (!) 155/92 (BP Location: Left Arm, Patient Position: Sitting, Cuff Size: Large)   Pulse 87   Temp 98.2 F (36.8 C) (Oral)   Ht 5\' 5"  (1.651 m)   Wt 94.1 kg   LMP 02/08/2020 (Exact Date)   Breastfeeding No   BMI 34.51 kg/m  CONSTITUTIONAL: Well-developed, well-nourished female in no acute distress.  HENT:  Normocephalic, atraumatic, External right and left ear normal. Oropharynx is clear and moist EYES: Conjunctivae and EOM are normal. Pupils are equal, round, and reactive to light. No scleral icterus.  NECK: Normal range of motion, supple, no masses.  Normal thyroid.  SKIN: Skin is warm and dry. No rash noted. Not diaphoretic. No erythema. No pallor. NEUROLGIC: Alert and oriented to person, place, and time. Normal reflexes, muscle tone coordination. No cranial nerve deficit noted. PSYCHIATRIC: DEPRESSED mood and affect. Patient states she's "depressed and needs to get out of this situation." Denies thoughts of harming herself, her infant, or anyone else.  Normal behavior. Normal judgment and thought content. CARDIOVASCULAR: Normal heart rate noted, regular rhythm RESPIRATORY: Clear to auscultation bilaterally. Effort and breath sounds normal, no problems with respiration noted. BREASTS: not performed ABDOMEN: Soft, normal bowel sounds, no distention noted.  No tenderness, rebound or guarding.  PELVIC: Normal appearing external genitalia; normal appearing vaginal mucosa and cervix.  No abnormal discharge noted.  Pap smear obtained.  Normal uterine size, no other palpable masses, no uterine or adnexal tenderness. MUSCULOSKELETAL: Normal range of motion. No tenderness.  No cyanosis, clubbing, or edema.  2+ distal pulses.   Assessment:   1. Chronic hypertension -  Continue management with PCP  2. Vaginal discharge - Cervicovaginal ancillary only( [place])  3. Vaginal odor - Cervicovaginal ancillary only( [place])  4. Screen for STD (sexually transmitted disease) - Cervicovaginal ancillary only( [place]) - RPR+HBsAg+HCVAb+...  5. Female hirsutism - Thyroid Panel With TSH - Testosterone  6. Encounter for well woman exam with routine gynecological exam  7. Encounter for surveillance of contraceptive device - POPs prescription refill sent to pharmacy   Plan:  Routine preventative health maintenance measures emphasized. Please refer to After Visit Summary for other counseling recommendations.    14/01/2020, Student-MidWife   Attestation of Supervision of Student:  I confirm that I have verified the information documented in the nurse midwife student's note and that I have also personally reperformed the history, physical exam and all medical decision making activities.  I  have verified that all services and findings are accurately documented in this student's note; and I agree with management and plan as outlined in the documentation. I have also made any necessary editorial changes.  Refill PoPpill and PNV Rx. To see integrated behavioral health LCSW d/t elevated PHQ-9 score. F/U with PCP for HTN.   Raelyn Mora, CNM [facility] [employer], [facility] 02/18/2020 3:12 PM

## 2020-02-18 NOTE — Progress Notes (Signed)
Attestation of Supervision of Student:  I confirm that I have verified the information documented in the nurse midwife student's note and that I have also personally reperformed the history, physical exam and all medical decision making activities.  I have verified that all services and findings are accurately documented in this student's note; and I agree with management and plan as outlined in the documentation. I have also made any necessary editorial changes.  Refill PoPs pill and PNV Rx. To see integrated behavioral health LCSW d/t elevated PHQ-9 score. F/U with PCP for HTN.   Raelyn Mora, CNM [facility] [employer], [facility] 02/18/2020 3:12 PM

## 2020-02-19 LAB — THYROID PANEL WITH TSH
Free Thyroxine Index: 2.1 (ref 1.2–4.9)
T3 Uptake Ratio: 25 % (ref 24–39)
T4, Total: 8.3 ug/dL (ref 4.5–12.0)
TSH: 0.667 u[IU]/mL (ref 0.450–4.500)

## 2020-02-19 LAB — RPR+HBSAG+HCVAB+...
HIV Screen 4th Generation wRfx: NONREACTIVE
Hep C Virus Ab: <0.1 s/co ratio (ref 0.0–0.9)
Hepatitis B Surface Ag: NEGATIVE
RPR Ser Ql: NONREACTIVE

## 2020-02-19 LAB — TESTOSTERONE: Testosterone: 25 ng/dL (ref 8–60)

## 2020-02-23 LAB — CERVICOVAGINAL ANCILLARY ONLY
Bacterial Vaginitis (gardnerella): POSITIVE — AB
Candida Glabrata: NEGATIVE
Candida Vaginitis: NEGATIVE
Chlamydia: NEGATIVE
Comment: NEGATIVE
Comment: NEGATIVE
Comment: NEGATIVE
Comment: NEGATIVE
Comment: NEGATIVE
Comment: NORMAL
Neisseria Gonorrhea: NEGATIVE
Trichomonas: NEGATIVE

## 2020-02-25 ENCOUNTER — Telehealth: Payer: Self-pay | Admitting: *Deleted

## 2020-02-25 ENCOUNTER — Ambulatory Visit: Payer: Medicaid Other | Admitting: Licensed Clinical Social Worker

## 2020-02-25 DIAGNOSIS — N898 Other specified noninflammatory disorders of vagina: Secondary | ICD-10-CM

## 2020-02-25 MED ORDER — METRONIDAZOLE 500 MG PO TABS: 500.0000 mg | ORAL_TABLET | Freq: Two times a day (BID) | ORAL | 0 refills | Status: DC

## 2020-02-25 NOTE — Telephone Encounter (Signed)
-----   Message from Raelyn Mora, [place] sent at 02/24/2020  7:58 PM EST ----- Please treat for BV

## 2020-02-26 ENCOUNTER — Other Ambulatory Visit: Payer: Self-pay

## 2020-02-26 ENCOUNTER — Ambulatory Visit (HOSPITAL_COMMUNITY)
Admission: EM | Admit: 2020-02-26 | Discharge: 2020-02-26 | Disposition: A | Discharge status: Home / Self Care | Payer: Medicaid Other | Attending: Emergency Medicine | Admitting: Emergency Medicine

## 2020-02-26 ENCOUNTER — Encounter (HOSPITAL_COMMUNITY): Payer: Self-pay | Admitting: Emergency Medicine

## 2020-02-26 DIAGNOSIS — Z20822 Contact with and (suspected) exposure to covid-19: Secondary | ICD-10-CM

## 2020-02-26 NOTE — ED Triage Notes (Signed)
Mom is here for COVID testing... has been having some PND x4 days  A&O x4... NAD.Marland Kitchen. ambulatory

## 2020-02-26 NOTE — Discharge Instructions (Addendum)
Test results will show on your my chart

## 2020-02-26 NOTE — ED Provider Notes (Signed)
[facility]    CSN: [id number] Arrival date & time: 02/26/20  1836      History   Chief Complaint Chief Complaint  Patient presents with  . URI    HPI Natalie Chase is a 39 y.o. female.   Mother brought in child states that he was tx and dx for rsv and ear infection. She wanted to be checked for rsv. Does not really have any symptoms but has a "stuffy nose " at times. None now.      Past Medical History:  Diagnosis Date  . Asthma   . Hypertension   . Lupus (systemic lupus erythematosus) ([facility])   . Sickle cell trait ([facility])   . Vaginal Pap smear, abnormal     Patient Active Problem List   Diagnosis Date Noted  . Encounter for induction of labor 05/10/2019  . Lupus (systemic lupus erythematosus) ([facility])   . Sickle cell trait in mother affecting pregnancy ([facility]) 11/14/2018  . Supervision of high risk pregnancy, antepartum 10/29/2018  . Chronic hypertension affecting pregnancy 10/29/2018  . Hypertension 06/11/2017    Past Surgical History:  Procedure Laterality Date  . CHOLECYSTECTOMY      OB History    Gravida  3   Para  2   Term  2   Preterm      AB  1   Living  2     SAB      IAB  1   Ectopic      Multiple  0   Live Births  2            Home Medications    Prior to Admission medications   Medication Sig Start Date End Date Taking? Authorizing Provider  acetaminophen (TYLENOL) 325 MG tablet Take 2 tablets (650 mg total) by mouth every 4 (four) hours as needed (for pain scale < 4). 05/13/19   Fair, Hoyle Sauer, MD  albuterol (VENTOLIN HFA) 108 (90 Base) MCG/ACT inhaler Inhale 1-2 puffs into the lungs every 6 (six) hours as needed for wheezing or shortness of breath. 07/10/19   Grayce Sessions, NP  Cholecalciferol (VITAMIN D3) 50 MCG (2000 UT) TABS Take 1 capsule by mouth daily.    [provider]  hydroxychloroquine (PLAQUENIL) 200 MG tablet Take 400 mg by mouth daily.     [provider]  ibuprofen (ADVIL) 600  MG tablet Take 1 tablet (600 mg total) by mouth every 6 (six) hours. 05/13/19   Fair, Hoyle Sauer, MD  lisinopril-hydrochlorothiazide (ZESTORETIC) 20-25 MG tablet Take 1 tablet by mouth daily. 02/05/20   Grayce Sessions, NP  metroNIDAZOLE (FLAGYL) 500 MG tablet Take 1 tablet (500 mg total) by mouth 2 (two) times daily. 02/25/20   Raelyn Mora, CNM  NIFEdipine (PROCARDIA XL/NIFEDICAL XL) 60 MG 24 hr tablet Take 1 tablet (60 mg total) by mouth daily. 07/09/19   Grayce Sessions, NP  norethindrone (MICRONOR) 0.35 MG tablet Take 1 tablet (0.35 mg total) by mouth daily. 02/18/20   Raelyn Mora, CNM  Prenatal Vit-Fe Fumarate-FA (NIVA-PLUS) 27-1 MG TABS Take 1 tablet by mouth daily. 02/18/20   Raelyn Mora, CNM  sertraline (ZOLOFT) 50 MG tablet Take 1 tablet (50 mg total) by mouth daily. 06/09/19   Constant, Peggy, MD    Family History Family History  Problem Relation Age of Onset  . Achondroplasia Mother   . Lupus Mother   . Sickle cell anemia Mother   . Hypertension Father   . Stroke  Father   . Cancer Other   . Heart disease Maternal Grandmother     Social History Social History   Tobacco Use  . Smoking status: Former Smoker    Packs/day: 0.00    Types: Cigarettes    Quit date: 01/09/2017    Years since quitting: 3.1  . Smokeless tobacco: Former Clinical biochemist  . Vaping Use: Never used  Substance Use Topics  . Alcohol use: Not Currently  . Drug use: Not Currently    Types: Marijuana    Comment: Stopped August 5th, 2020     Allergies   Patient has no known allergies.   Review of Systems Review of Systems  Constitutional: Negative.   HENT:       Runny nose   Eyes: Negative.   Respiratory: Negative.   Cardiovascular: Negative.   Neurological: Negative.      Physical Exam Triage Vital Signs ED Triage Vitals [02/26/20 2018]  Enc Vitals Group     BP (!) 166/91     Pulse Rate 97     Resp 18     Temp 99.7 F (37.6 C)     Temp Source Oral     SpO2 97 %      Weight      Height      Head Circumference      Peak Flow      Pain Score 0     Pain Loc      Pain Edu?      Excl. in GC?    No data found.  Updated Vital Signs BP (!) 166/91 (BP Location: Right Arm)   Pulse 97   Temp 99.7 F (37.6 C) (Oral)   Resp 18   LMP 02/08/2020 (Exact Date)   SpO2 97%   Visual Acuity :     Physical Exam HENT:     Nose: Nose normal.     Mouth/Throat:     Mouth: Mucous membranes are moist.  Eyes:     Pupils: Pupils are equal, round, and reactive to light.  Cardiovascular:     Rate and Rhythm: Normal rate.  Pulmonary:     Effort: Pulmonary effort is normal.  Neurological:     Mental Status: She is alert.      UC Treatments / Results  Labs (all labs ordered are listed, but only abnormal results are displayed) Labs Reviewed  SARS CORONAVIRUS 2 (TAT 6-24 HRS)    EKG   Radiology No results found.  Procedures Procedures (including critical care time)  Medications Ordered in UC Medications - No data to display  Initial Impression / Assessment and Plan / UC Course  I have reviewed the triage vital signs and the nursing notes.  Pertinent labs & imaging results that were available during my care of the patient were reviewed by me and considered in my medical decision making (see chart for details).    Follow up with my chart for results   Final Clinical Impressions(s) / UC Diagnoses   Final diagnoses:  Contact with and (suspected) exposure to covid-19     Discharge Instructions     Test results will show on your my chart      ED Prescriptions    None     PDMP not reviewed this encounter.   Coralyn Mark, NP 02/26/20 2056

## 2020-02-27 ENCOUNTER — Other Ambulatory Visit: Payer: Self-pay | Admitting: Obstetrics and Gynecology

## 2020-02-27 LAB — SARS CORONAVIRUS 2 (TAT 6-24 HRS): SARS Coronavirus 2: NEGATIVE

## 2020-02-28 NOTE — L&D Delivery Note (Signed)
OB/GYN Faculty Practice Delivery Note  Natalie Chase is a 40 y.o. [id number] s/p SVD at [redacted]w[redacted]d. She was admitted for IOL due to [facility] with superimposed pre-eclampsia.   ROM: 6h 40m with clear fluid GBS Status: Negative  Delivery Date/Time: 01/29/21 at 2245  Delivery: Called to room and patient was complete and pushing. Head delivered direct OP. Double nuchal cord present and reduced at the perineum. Shoulder and body delivered in usual fashion. Infant with spontaneous cry, placed on mother's abdomen, dried and stimulated. Cord clamped x 2 after 1-minute delay, and cut by FOB under my direct supervision. Cord blood drawn. Placenta delivered spontaneously with gentle cord traction. Fundus firm with massage and Pitocin. Labia, perineum, vagina, and cervix were inspected, and patient was found to have a small perineal abrasion that was repaired with 4-0 Monocryl and found to be hemostatic.   Placenta: Intact, 3VC - sent to L&D Complications: None  Lacerations: Perineal abrasion EBL: 350 cc Analgesia: Epidural  Infant: Viable female  APGARs 7 and 9  Evalina Field, MD OB/GYN Fellow, Faculty Practice

## 2020-03-15 ENCOUNTER — Encounter (HOSPITAL_COMMUNITY): Payer: Self-pay | Admitting: Emergency Medicine

## 2020-03-15 ENCOUNTER — Ambulatory Visit (HOSPITAL_COMMUNITY)
Admission: EM | Admit: 2020-03-15 | Discharge: 2020-03-15 | Disposition: A | Discharge status: Home / Self Care | Payer: Medicaid Other | Attending: Urgent Care | Admitting: Urgent Care

## 2020-03-15 ENCOUNTER — Other Ambulatory Visit (HOSPITAL_COMMUNITY): Payer: Self-pay | Admitting: Urgent Care

## 2020-03-15 ENCOUNTER — Other Ambulatory Visit: Payer: Self-pay

## 2020-03-15 DIAGNOSIS — Z87891 Personal history of nicotine dependence: Secondary | ICD-10-CM | POA: Insufficient documentation

## 2020-03-15 DIAGNOSIS — J029 Acute pharyngitis, unspecified: Secondary | ICD-10-CM | POA: Diagnosis present

## 2020-03-15 DIAGNOSIS — Z79899 Other long term (current) drug therapy: Secondary | ICD-10-CM | POA: Insufficient documentation

## 2020-03-15 DIAGNOSIS — J45909 Unspecified asthma, uncomplicated: Secondary | ICD-10-CM | POA: Diagnosis not present

## 2020-03-15 DIAGNOSIS — J069 Acute upper respiratory infection, unspecified: Secondary | ICD-10-CM | POA: Diagnosis not present

## 2020-03-15 DIAGNOSIS — D573 Sickle-cell trait: Secondary | ICD-10-CM | POA: Diagnosis not present

## 2020-03-15 DIAGNOSIS — Z20822 Contact with and (suspected) exposure to covid-19: Secondary | ICD-10-CM | POA: Insufficient documentation

## 2020-03-15 MED ORDER — CETIRIZINE HCL 10 MG PO TABS: 10.0000 mg | ORAL_TABLET | Freq: Every day | ORAL | 0 refills | Status: DC

## 2020-03-15 MED ORDER — PROMETHAZINE-DM 6.25-15 MG/5ML PO SYRP: 5.0000 mL | ORAL_SOLUTION | Freq: Every evening | ORAL | 0 refills | Status: DC | PRN

## 2020-03-15 MED ORDER — BENZONATATE 100 MG PO CAPS: 100.0000 mg | ORAL_CAPSULE | Freq: Three times a day (TID) | ORAL | 0 refills | Status: DC | PRN

## 2020-03-15 MED ORDER — PREDNISONE 20 MG PO TABS: ORAL_TABLET | ORAL | 0 refills | Status: DC

## 2020-03-15 NOTE — ED Provider Notes (Signed)
[facility]   MRN: [id number] DOB: 02/10/1981  Subjective:   Natalie Chase is a 40 y.o. female presenting for 3-day history of acute onset fever, cough, sore throat, decreased appetite, sinus congestion.  Patient has used over-the-counter medications with minimal relief.  Has a history of sickle cell trait, lupus.  Also has a history of asthma.  Denies smoking cigarettes.  She has not COVID vaccinated.  No current facility-administered medications for this encounter.  Current Outpatient Medications:  .  acetaminophen (TYLENOL) 325 MG tablet, Take 2 tablets (650 mg total) by mouth every 4 (four) hours as needed (for pain scale < 4)., Disp: 30 tablet, Rfl: 0 .  albuterol (VENTOLIN HFA) 108 (90 Base) MCG/ACT inhaler, Inhale 1-2 puffs into the lungs every 6 (six) hours as needed for wheezing or shortness of breath., Disp: 18 g, Rfl: 1 .  Cholecalciferol (VITAMIN D3) 50 MCG (2000 UT) TABS, Take 1 capsule by mouth daily., Disp: , Rfl:  .  hydroxychloroquine (PLAQUENIL) 200 MG tablet, Take 400 mg by mouth daily. , Disp: , Rfl:  .  ibuprofen (ADVIL) 600 MG tablet, Take 1 tablet (600 mg total) by mouth every 6 (six) hours., Disp: 30 tablet, Rfl: 0 .  lisinopril-hydrochlorothiazide (ZESTORETIC) 20-25 MG tablet, Take 1 tablet by mouth daily., Disp: 90 tablet, Rfl: 3 .  metroNIDAZOLE (FLAGYL) 500 MG tablet, Take 1 tablet (500 mg total) by mouth 2 (two) times daily., Disp: 14 tablet, Rfl: 0 .  NIFEdipine (PROCARDIA XL/NIFEDICAL XL) 60 MG 24 hr tablet, Take 1 tablet (60 mg total) by mouth daily., Disp: 90 tablet, Rfl: 1 .  norethindrone (MICRONOR) 0.35 MG tablet, Take 1 tablet (0.35 mg total) by mouth daily., Disp: 30 tablet, Rfl: 11 .  Prenatal Vit-Fe Fumarate-FA (NIVA-PLUS) 27-1 MG TABS, Take 1 tablet by mouth daily., Disp: 30 tablet, Rfl: 12 .  sertraline (ZOLOFT) 50 MG tablet, Take 1 tablet (50 mg total) by mouth daily., Disp: 30 tablet, Rfl: 3   No Known Allergies  Past Medical  History:  Diagnosis Date  . Asthma   . Hypertension   . Lupus (systemic lupus erythematosus) (HCC)   . Sickle cell trait (HCC)   . Vaginal Pap smear, abnormal      Past Surgical History:  Procedure Laterality Date  . CHOLECYSTECTOMY      Family History  Problem Relation Age of Onset  . Achondroplasia Mother   . Lupus Mother   . Sickle cell anemia Mother   . Hypertension Father   . Stroke Father   . Cancer Other   . Heart disease Maternal Grandmother     Social History   Tobacco Use  . Smoking status: Former Smoker    Packs/day: 0.00    Types: Cigarettes    Quit date: 01/09/2017    Years since quitting: 3.1  . Smokeless tobacco: Former Clinical biochemist  . Vaping Use: Never used  Substance Use Topics  . Alcohol use: Not Currently  . Drug use: Not Currently    Types: Marijuana    Comment: Stopped August 5th, 2020    ROS   Objective:   Vitals: BP (!) 174/92 (BP Location: Right Arm)   Pulse 82   Temp 98.7 F (37.1 C) (Oral)   Resp 17   LMP 03/08/2020 (Exact Date)   SpO2 100%   Breastfeeding No   Physical Exam Constitutional:      General: She is not in acute distress.    Appearance:  Normal appearance. She is well-developed. She is not ill-appearing, toxic-appearing or diaphoretic.  HENT:     Head: Normocephalic and atraumatic.     Nose: Nose normal.     Mouth/Throat:     Mouth: Mucous membranes are moist.  Eyes:     Extraocular Movements: Extraocular movements intact.     Pupils: Pupils are equal, round, and reactive to light.  Cardiovascular:     Rate and Rhythm: Normal rate and regular rhythm.     Pulses: Normal pulses.     Heart sounds: Normal heart sounds. No murmur heard. No friction rub. No gallop.   Pulmonary:     Effort: Pulmonary effort is normal. No respiratory distress.     Breath sounds: Normal breath sounds. No stridor. No wheezing, rhonchi or rales.  Skin:    General: Skin is warm and dry.     Findings: No rash.  Neurological:      Mental Status: She is alert and oriented to person, place, and time.  Psychiatric:        Mood and Affect: Mood normal.        Behavior: Behavior normal.        Thought Content: Thought content normal.     Assessment and Plan :   PDMP not reviewed this encounter.  1. Viral URI with cough   2. Sore throat      Patient prefers to use prednisone course as she usually does better with this. Otherwise, will manage for viral illness such as viral URI, viral syndrome, viral rhinitis, COVID-19. Counseled patient on nature of COVID-19 including modes of transmission, diagnostic testing, management and supportive care.  Offered scripts for symptomatic relief. COVID 19 testing is pending. Counseled patient on potential for adverse effects with medications prescribed/recommended today, ER and return-to-clinic precautions discussed, patient verbalized understanding.     Wallis Bamberg, [place] 03/15/20 1858

## 2020-03-15 NOTE — Discharge Instructions (Addendum)

## 2020-03-15 NOTE — ED Triage Notes (Signed)
Pt states that she has a cough, fever, sore throat, no appetite, and nasal congestion. Pt states that her sx started Saturday. Pt states that she has tried OTC medication and nothing has helped.

## 2020-03-16 LAB — SARS CORONAVIRUS 2 (TAT 6-24 HRS): SARS Coronavirus 2: NEGATIVE

## 2020-04-07 ENCOUNTER — Telehealth (INDEPENDENT_AMBULATORY_CARE_PROVIDER_SITE_OTHER): Payer: Medicaid Other | Admitting: Primary Care

## 2020-04-07 ENCOUNTER — Other Ambulatory Visit: Payer: Self-pay

## 2020-04-07 DIAGNOSIS — I10 Essential (primary) hypertension: Secondary | ICD-10-CM | POA: Diagnosis not present

## 2020-04-07 MED ORDER — NIFEDIPINE ER OSMOTIC RELEASE 60 MG PO TB24
60.0000 mg | ORAL_TABLET | Freq: Every day | ORAL | 1 refills | Status: DC
Start: 2020-04-07 — End: 2020-07-12

## 2020-04-07 NOTE — Progress Notes (Signed)
Telephone Note  I connected with Natalie Chase on 04/07/20 at 11:10 AM EST by telephone and verified that I am speaking with the correct person using two identifiers.  Location: Patient: work  Provider: Grayce Sessions working from home   I discussed the limitations, risks, security and privacy concerns of performing an evaluation and management service by telephone and the availability of in person appointments. I also discussed with the patient that there may be a patient responsible charge related to this service. The patient expressed understanding and agreed to proceed.   History of Present Illness: Natalie Chase is a 40 year old female having a blood pressure follow-up started new medication on last visit lisinopril HCTZ and continue Procardia 60 XR.  Systolic has ranged from 116- 643 and diastolic range from 68-160  (pt was sick and went UC for isolated 160/110 blood pressure). Denies shortness of breath, headaches, chest pain or lower extremity edema   Current Outpatient Medications on File Prior to Visit  Medication Sig Dispense Refill  . acetaminophen (TYLENOL) 325 MG tablet Take 2 tablets (650 mg total) by mouth every 4 (four) hours as needed (for pain scale < 4). 30 tablet 0  . albuterol (VENTOLIN HFA) 108 (90 Base) MCG/ACT inhaler Inhale 1-2 puffs into the lungs every 6 (six) hours as needed for wheezing or shortness of breath. 18 g 1  . benzonatate (TESSALON) 100 MG capsule Take 1-2 capsules (100-200 mg total) by mouth 3 (three) times daily as needed for cough. 60 capsule 0  . cetirizine (ZYRTEC ALLERGY) 10 MG tablet Take 1 tablet (10 mg total) by mouth daily. 30 tablet 0  . Cholecalciferol (VITAMIN D3) 50 MCG (2000 UT) TABS Take 1 capsule by mouth daily.    . hydroxychloroquine (PLAQUENIL) 200 MG tablet Take 400 mg by mouth daily.     Marland Kitchen ibuprofen (ADVIL) 600 MG tablet Take 1 tablet (600 mg total) by mouth every 6 (six) hours. 30 tablet 0  . lisinopril-hydrochlorothiazide  (ZESTORETIC) 20-25 MG tablet Take 1 tablet by mouth daily. 90 tablet 3  . metroNIDAZOLE (FLAGYL) 500 MG tablet Take 1 tablet (500 mg total) by mouth 2 (two) times daily. 14 tablet 0  . NIFEdipine (PROCARDIA XL/NIFEDICAL XL) 60 MG 24 hr tablet Take 1 tablet (60 mg total) by mouth daily. 90 tablet 1  . norethindrone (MICRONOR) 0.35 MG tablet Take 1 tablet (0.35 mg total) by mouth daily. 30 tablet 11  . predniSONE (DELTASONE) 20 MG tablet Take 2 tablets daily with breakfast. 10 tablet 0  . Prenatal Vit-Fe Fumarate-FA (NIVA-PLUS) 27-1 MG TABS Take 1 tablet by mouth daily. 30 tablet 12  . promethazine-dextromethorphan (PROMETHAZINE-DM) 6.25-15 MG/5ML syrup Take 5 mLs by mouth at bedtime as needed for cough. 100 mL 0  . sertraline (ZOLOFT) 50 MG tablet Take 1 tablet (50 mg total) by mouth daily. 30 tablet 3   No current facility-administered medications on file prior to visit.   Observations/Objective: Pertinet positive and negative noted in HPI  Assessment and Plan: Diagnoses and all orders for this visit:  Primary hypertension Counseled on blood pressure goal of less than 130/80, low-sodium, DASH diet, medication compliance, 150 minutes of moderate intensity exercise per week. Discussed medication compliance, adverse effects. -     NIFEdipine (PROCARDIA XL/NIFEDICAL XL) 60 MG 24 hr tablet; Take 1 tablet (60 mg total) by mouth daily.   Follow Up Instructions:    I discussed the assessment and treatment plan with the patient. The patient was provided an  opportunity to ask questions and all were answered. The patient agreed with the plan and demonstrated an understanding of the instructions.   The patient was advised to call back or seek an in-person evaluation if the symptoms worsen or if the condition fails to improve as anticipated.  I provided 12 minutes of non-face-to-face time during this encounter.   Grayce Sessions, NP

## 2020-05-05 ENCOUNTER — Other Ambulatory Visit (HOSPITAL_COMMUNITY): Payer: Self-pay | Admitting: Nurse Practitioner

## 2020-05-28 DIAGNOSIS — R7303 Prediabetes: Secondary | ICD-10-CM

## 2020-05-28 HISTORY — DX: Prediabetes: R73.03

## 2020-06-03 ENCOUNTER — Other Ambulatory Visit (HOSPITAL_COMMUNITY): Payer: Self-pay

## 2020-06-03 MED ORDER — VITAMIN D (ERGOCALCIFEROL) 1.25 MG (50000 UNIT) PO CAPS
ORAL_CAPSULE | ORAL | 1 refills | Status: DC
  Filled 2020-06-03: qty 12, 84d supply, fill #0

## 2020-06-03 MED ORDER — METFORMIN HCL 500 MG PO TABS
ORAL_TABLET | ORAL | 3 refills | Status: DC
  Filled 2020-06-03: qty 30, 30d supply, fill #0

## 2020-06-03 MED ORDER — TOPIRAMATE 100 MG PO TABS
100.0000 mg | ORAL_TABLET | Freq: Every day | ORAL | 0 refills | Status: DC
  Filled 2020-06-03: qty 30, 30d supply, fill #0

## 2020-06-03 MED ORDER — FERROUS SULFATE 325 (65 FE) MG PO TABS
325.0000 mg | ORAL_TABLET | Freq: Every day | ORAL | 1 refills | Status: DC
  Filled 2020-06-03: qty 90, 90d supply, fill #0

## 2020-06-03 MED ORDER — DIETHYLPROPION HCL ER 75 MG PO TB24
1.0000 | ORAL_TABLET | Freq: Every day | ORAL | 0 refills | Status: DC
  Filled 2020-06-03: qty 30, 30d supply, fill #0

## 2020-06-04 ENCOUNTER — Other Ambulatory Visit (HOSPITAL_COMMUNITY): Payer: Self-pay

## 2020-06-15 ENCOUNTER — Other Ambulatory Visit (HOSPITAL_COMMUNITY): Payer: Self-pay

## 2020-07-05 ENCOUNTER — Encounter (HOSPITAL_COMMUNITY): Payer: Self-pay | Admitting: Family Medicine

## 2020-07-05 ENCOUNTER — Inpatient Hospital Stay (HOSPITAL_COMMUNITY)
Admission: AD | Admit: 2020-07-05 | Discharge: 2020-07-06 | Disposition: A | Discharge status: Home / Self Care | Payer: Medicaid Other | Attending: Family Medicine | Admitting: Family Medicine

## 2020-07-05 ENCOUNTER — Encounter: Payer: Medicaid Other | Admitting: Obstetrics and Gynecology

## 2020-07-05 DIAGNOSIS — K529 Noninfective gastroenteritis and colitis, unspecified: Secondary | ICD-10-CM

## 2020-07-05 DIAGNOSIS — Z3A08 8 weeks gestation of pregnancy: Secondary | ICD-10-CM

## 2020-07-05 DIAGNOSIS — Z79899 Other long term (current) drug therapy: Secondary | ICD-10-CM | POA: Insufficient documentation

## 2020-07-05 DIAGNOSIS — O99611 Diseases of the digestive system complicating pregnancy, first trimester: Secondary | ICD-10-CM | POA: Diagnosis not present

## 2020-07-05 DIAGNOSIS — O10911 Unspecified pre-existing hypertension complicating pregnancy, first trimester: Secondary | ICD-10-CM | POA: Insufficient documentation

## 2020-07-05 DIAGNOSIS — O09521 Supervision of elderly multigravida, first trimester: Secondary | ICD-10-CM | POA: Insufficient documentation

## 2020-07-05 DIAGNOSIS — Z87891 Personal history of nicotine dependence: Secondary | ICD-10-CM | POA: Diagnosis not present

## 2020-07-05 DIAGNOSIS — O218 Other vomiting complicating pregnancy: Secondary | ICD-10-CM | POA: Insufficient documentation

## 2020-07-05 DIAGNOSIS — O10011 Pre-existing essential hypertension complicating pregnancy, first trimester: Secondary | ICD-10-CM | POA: Diagnosis not present

## 2020-07-05 DIAGNOSIS — O10919 Unspecified pre-existing hypertension complicating pregnancy, unspecified trimester: Secondary | ICD-10-CM

## 2020-07-05 LAB — CBC
HCT: 36.5 % (ref 36.0–46.0)
Hemoglobin: 11.9 g/dL — ABNORMAL LOW (ref 12.0–15.0)
MCH: 25.2 pg — ABNORMAL LOW (ref 26.0–34.0)
MCHC: 32.6 g/dL (ref 30.0–36.0)
MCV: 77.2 fL — ABNORMAL LOW (ref 80.0–100.0)
Platelets: 386 10*3/uL (ref 150–400)
RBC: 4.73 MIL/uL (ref 3.87–5.11)
RDW: 16.7 % — ABNORMAL HIGH (ref 11.5–15.5)
WBC: 18.5 10*3/uL — ABNORMAL HIGH (ref 4.0–10.5)
nRBC: 0 % (ref 0.0–0.2)

## 2020-07-05 LAB — POCT PREGNANCY, URINE: Preg Test, Ur: POSITIVE — AB

## 2020-07-05 MED ORDER — LABETALOL HCL 5 MG/ML IV SOLN
40.0000 mg | INTRAVENOUS | Status: DC | PRN
  Administered 2020-07-05: 40 mg via INTRAVENOUS
  Filled 2020-07-05: qty 8

## 2020-07-05 MED ORDER — ONDANSETRON HCL 4 MG/2ML IJ SOLN
4.0000 mg | Freq: Once | INTRAMUSCULAR | Status: AC
  Administered 2020-07-05: 4 mg via INTRAVENOUS
  Filled 2020-07-05: qty 2

## 2020-07-05 MED ORDER — LABETALOL HCL 5 MG/ML IV SOLN: 80.0000 mg | INTRAVENOUS | Status: DC | PRN

## 2020-07-05 MED ORDER — HYDRALAZINE HCL 20 MG/ML IJ SOLN: 10.0000 mg | INTRAMUSCULAR | Status: DC | PRN

## 2020-07-05 MED ORDER — LABETALOL HCL 5 MG/ML IV SOLN
20.0000 mg | INTRAVENOUS | Status: DC | PRN
  Administered 2020-07-05: 20 mg via INTRAVENOUS
  Filled 2020-07-05: qty 4

## 2020-07-05 MED ORDER — LACTATED RINGERS IV BOLUS
1000.0000 mL | Freq: Once | INTRAVENOUS | Status: AC
  Administered 2020-07-05: 1000 mL via INTRAVENOUS

## 2020-07-05 MED ORDER — FAMOTIDINE IN NACL 20-0.9 MG/50ML-% IV SOLN
20.0000 mg | Freq: Once | INTRAVENOUS | Status: AC
  Administered 2020-07-05: 20 mg via INTRAVENOUS
  Filled 2020-07-05: qty 50

## 2020-07-05 NOTE — MAU Provider Note (Signed)
Chief Complaint:  Emesis   Event Date/Time   First Provider Initiated Contact with Patient 07/05/20 2153     HPI: Natalie Chase is a 40 y.o. [id number] at 6381w2d who presents to maternity admissions reporting nausea/vomiting for the past few weeks, getting progressively worse. Also notes diarrhea x2wks but only 1-2 episodes today. Has vomited 5-6x today. Also concerned about recent weight loss and feeling cold all the time. Has chronic hypertension and due to her nausea has not taken her meds today. Did not initially complain of a headache but acknowledged a slight one when asked. Denies vaginal bleeding or cramping.  Pregnancy Course: Receives care at [facility]  Past Medical History:  Diagnosis Date  . Asthma   . Hypertension   . Lupus (systemic lupus erythematosus) (HCC)   . Sickle cell trait (HCC)   . Vaginal Pap smear, abnormal    OB History  Gravida Para Term Preterm AB Living  4 2 2   1 2   SAB IAB Ectopic Multiple Live Births    1   0 2    # Outcome Date GA Lbr Len/2nd Weight Sex Delivery Anes PTL Lv  4 Current           3 Term 05/11/19 4759w3d 04:00 / 00:53 7 lb 1.4 oz (3.215 kg) M Vag-Spont EPI  LIV  2 Term 11/30/02 2340w0d  6 lb 14 oz (3.118 kg) M Vag-Spont   LIV  1 IAB            Past Surgical History:  Procedure Laterality Date  . CHOLECYSTECTOMY     Family History  Problem Relation Age of Onset  . Achondroplasia Mother   . Lupus Mother   . Sickle cell anemia Mother   . Hypertension Father   . Stroke Father   . Cancer Other   . Heart disease Maternal Grandmother    Social History   Tobacco Use  . Smoking status: Former Smoker    Packs/day: 0.00    Types: Cigarettes    Quit date: 01/09/2017    Years since quitting: 3.4  . Smokeless tobacco: Former Clinical biochemistUser  Vaping Use  . Vaping Use: Never used  Substance Use Topics  . Alcohol use: Not Currently  . Drug use: Not Currently    Types: Marijuana    Comment: Stopped August 5th, 2020   No Known Allergies No  medications prior to admission.   I have reviewed patient's Past Medical Hx, Surgical Hx, Family Hx, Social Hx, medications and allergies.   ROS:  Pertinent items noted in HPI and remainder of comprehensive ROS otherwise negative.  Physical Exam   Patient Vitals for the past 24 hrs:  BP Pulse Resp SpO2  07/06/20 0212 -- -- 16 100 %  07/06/20 0152 (!) 144/64 79 -- --  07/06/20 0150 -- -- -- 100 %  07/05/20 2331 (!) 151/99 81 -- --  07/05/20 2316 (!) 151/67 76 -- --  07/05/20 2315 -- -- -- 98 %  07/05/20 2310 -- -- -- 99 %  07/05/20 2305 -- -- -- 99 %  07/05/20 2301 136/68 78 -- --  07/05/20 2300 -- -- -- 100 %  07/05/20 2255 -- -- -- 100 %  07/05/20 2250 -- -- -- 100 %  07/05/20 2249 (!) 147/70 81 -- --  07/05/20 2246 (!) 86/40 (!) 276 -- --  07/05/20 2245 -- -- -- 100 %  07/05/20 2240 -- -- -- 100 %  07/05/20 2235 -- -- -- 100 %  07/05/20 2231 (!) 142/66 77 -- --  07/05/20 2230 -- -- -- 100 %  07/05/20 2225 -- -- -- 100 %  07/05/20 2220 -- -- -- 100 %  07/05/20 2216 (!) 175/96 83 -- --  07/05/20 2215 -- -- -- 100 %  07/05/20 2210 -- -- -- 94 %  07/05/20 2205 -- -- -- 100 %  07/05/20 2201 (!) 155/85 79 -- --  07/05/20 2200 -- -- -- 100 %  07/05/20 2155 (!) 143/78 79 -- 100 %  07/05/20 2135 -- -- -- 100 %  07/05/20 2133 (!) 182/81 85 -- --   Constitutional: Well-developed, well-nourished female in mild physical distress (lights off, 3-4 warm blankets with thick socks and fleece jacket wrapped around her head), rocking back and forth due to generalized discomfort re nausea and hypertensive headache.  Cardiovascular: normal rate & rhythm, no murmur Respiratory: normal effort, lung sounds clear throughout GI: Abd soft, non-tender, gravid appropriate for gestational age. Pos BS x 4 MS: Extremities nontender, no edema, normal ROM Neurologic: Alert and oriented x 4.  GU: no CVA tenderness Pelvic exam deferred   Labs: Results for orders placed or performed during the  hospital encounter of 07/05/20 (from the past 24 hour(s))  Urinalysis, Routine w reflex microscopic Urine, Clean Catch     Status: Abnormal   Collection Time: 07/05/20  9:22 PM  Result Value Ref Range   Color, Urine YELLOW YELLOW   APPearance HAZY (A) CLEAR   Specific Gravity, Urine 1.015 1.005 - 1.030   pH 6.0 5.0 - 8.0   Glucose, UA NEGATIVE NEGATIVE mg/dL   Hgb urine dipstick NEGATIVE NEGATIVE   Bilirubin Urine NEGATIVE NEGATIVE   Ketones, ur 80 (A) NEGATIVE mg/dL   Protein, ur NEGATIVE NEGATIVE mg/dL   Nitrite NEGATIVE NEGATIVE   Leukocytes,Ua NEGATIVE NEGATIVE  CBC     Status: Abnormal   Collection Time: 07/05/20 10:28 PM  Result Value Ref Range   WBC 18.5 (H) 4.0 - 10.5 K/uL   RBC 4.73 3.87 - 5.11 MIL/uL   Hemoglobin 11.9 (L) 12.0 - 15.0 g/dL   HCT 26.8 34.1 - 96.2 %   MCV 77.2 (L) 80.0 - 100.0 fL   MCH 25.2 (L) 26.0 - 34.0 pg   MCHC 32.6 30.0 - 36.0 g/dL   RDW 22.9 (H) 79.8 - 92.1 %   Platelets 386 150 - 400 K/uL   nRBC 0.0 0.0 - 0.2 %  Comprehensive metabolic panel     Status: Abnormal   Collection Time: 07/05/20 10:28 PM  Result Value Ref Range   Sodium 134 (L) 135 - 145 mmol/L   Potassium 3.0 (L) 3.5 - 5.1 mmol/L   Chloride 104 98 - 111 mmol/L   CO2 23 22 - 32 mmol/L   Glucose, Bld 82 70 - 99 mg/dL   BUN 6 6 - 20 mg/dL   Creatinine, Ser 1.94 0.44 - 1.00 mg/dL   Calcium 8.9 8.9 - 17.4 mg/dL   Total Protein 7.1 6.5 - 8.1 g/dL   Albumin 3.7 3.5 - 5.0 g/dL   AST 14 (L) 15 - 41 U/L   ALT 9 0 - 44 U/L   Alkaline Phosphatase 57 38 - 126 U/L   Total Bilirubin 0.5 0.3 - 1.2 mg/dL   GFR, Estimated >08 >14 mL/min   Anion gap 7 5 - 15  Lipase, blood     Status: None   Collection Time: 07/05/20 10:28 PM  Result Value Ref Range   Lipase 23  11 - 51 U/L  TSH     Status: Abnormal   Collection Time: 07/05/20 11:21 PM  Result Value Ref Range   TSH 0.322 (L) 0.350 - 4.500 uIU/mL   Imaging:  No results found.  MAU Course: Orders Placed This Encounter  Procedures  .  Urinalysis, Routine w reflex microscopic Urine, Clean Catch  . CBC  . Comprehensive metabolic panel  . TSH  . Lipase, blood  . Pregnancy, urine POC  . Discharge patient   Meds ordered this encounter  Medications  . lactated ringers bolus 1,000 mL  . ondansetron (ZOFRAN) injection 4 mg  . famotidine (PEPCID) IVPB 20 mg premix  . DISCONTD: labetalol (NORMODYNE) injection 20 mg  . DISCONTD: labetalol (NORMODYNE) injection 40 mg  . DISCONTD: labetalol (NORMODYNE) injection 80 mg  . DISCONTD: hydrALAZINE (APRESOLINE) injection 10 mg  . DISCONTD: promethazine (PHENERGAN) tablet 25 mg  . Doxylamine-Pyridoxine (DICLEGIS) 10-10 MG TBEC    Sig: Take 1-2 tablets by mouth at bedtime. Diclegis Instructions 1.  Take 2 tablets at bedtime. 2.  If symptoms persist, add another tablet in the morning. 3.  If symptoms persist, add another tablet in the afternoon.  You may take up to 4 tablets/day    Dispense:  60 tablet    Refill:  1    Order Specific Question:   Supervising Provider    Answer:   Reva Bores [[id number]]  . potassium chloride (KLOR-CON) 10 MEQ tablet    Sig: Take 1 tablet (10 mEq total) by mouth daily.    Dispense:  5 tablet    Refill:  0    Order Specific Question:   Supervising Provider    Answer:   Reva Bores [[id number]]   MDM: LR bolus + IV zofran with pepcid IVPB with complete relief of nausea, with successful po challenge  Full labetalol protocol + hydralazine given, BP came to elevated by not severe range and pt stated her headache was gone. Counseled heavily about importance of taking hypertension meds including risks of not taking it like seizures, stroke, increased risk of miscarriage, etc. Pt verbalized understanding. Advised to place phenergan vaginally if she cannot tolerate it po so she can take other meds.   Hypokalemia and mild hyperthyroidism found - will send message to office for close follow up of thyroid levels. Oral potassium sent to pharmacy.  Assessment: 1.  Gastroenteritis   2. Chronic hypertension affecting pregnancy   3. [redacted] weeks gestation of pregnancy    Plan: Discharge home in stable condition with first trimester and hypertension precautions.   Follow-up Information    [facility]. Go to.   Specialty: Obstetrics and Gynecology Why: as scheduled for ongoing prenatal care Contact information: [address], [address] [place] [address] [phone]              Allergies as of 07/06/2020   No Known Allergies     Medication List    STOP taking these medications   acetaminophen 325 MG tablet Commonly known as: Tylenol   hydroxychloroquine 200 MG tablet Commonly known as: PLAQUENIL   ibuprofen 600 MG tablet Commonly known as: ADVIL   lisinopril-hydrochlorothiazide 20-25 MG tablet Commonly known as: ZESTORETIC   metroNIDAZOLE 500 MG tablet Commonly known as: FLAGYL   norethindrone 0.35 MG tablet Commonly known as: MICRONOR   predniSONE 20 MG tablet Commonly known as: DELTASONE   promethazine-dextromethorphan 6.25-15 MG/5ML syrup Commonly known as: PROMETHAZINE-DM  Vitamin D (Ergocalciferol) 1.25 MG (50000 UNIT) Caps capsule Commonly known as: DRISDOL     TAKE these medications   albuterol 108 (90 Base) MCG/ACT inhaler Commonly known as: VENTOLIN HFA Inhale 1-2 puffs into the lungs every 6 (six) hours as needed for wheezing or shortness of breath.   benzonatate 100 MG capsule Commonly known as: TESSALON TAKE 1 TO 2 CAPSULES BY MOUTH 3 TIMES DAILY AS NEEDED FOR COUGH.   cetirizine 10 MG tablet Commonly known as: ZYRTEC TAKE 1 TABLET (10 MG TOTAL) BY MOUTH DAILY.   Diethylpropion HCl CR 75 MG Tb24 Take 1 tablet by mouth daily as directed What changed: Another medication with the same name was removed. Continue taking this medication, and follow the directions you see here.   Doxylamine-Pyridoxine 10-10 MG Tbec Commonly known as: Diclegis Take 1-2  tablets by mouth at bedtime. Diclegis Instructions 1.  Take 2 tablets at bedtime. 2.  If symptoms persist, add another tablet in the morning. 3.  If symptoms persist, add another tablet in the afternoon.  You may take up to 4 tablets/day   ferrous sulfate 325 (65 FE) MG tablet Take 1 tablet by mouth daily   metFORMIN 500 MG tablet Commonly known as: GLUCOPHAGE Take 1 (one) Tablet by mouth in evening What changed: Another medication with the same name was removed. Continue taking this medication, and follow the directions you see here.   NIFEdipine 60 MG 24 hr tablet Commonly known as: PROCARDIA XL/NIFEDICAL XL Take 1 tablet (60 mg total) by mouth daily.   Niva-Plus 27-1 MG Tabs Take 1 tablet by mouth daily.   potassium chloride 10 MEQ tablet Commonly known as: KLOR-CON Take 1 tablet (10 mEq total) by mouth daily.   sertraline 50 MG tablet Commonly known as: Zoloft Take 1 tablet (50 mg total) by mouth daily.   topiramate 100 MG tablet Commonly known as: TOPAMAX Take 1 tablet by mouth at bedtime What changed: Another medication with the same name was removed. Continue taking this medication, and follow the directions you see here.   Vitamin D3 50 MCG (2000 UT) Tabs Take 1 capsule by mouth daily.       Edd Arbour, CNM, MSN, IBCLC Certified Nurse Midwife, [facility]

## 2020-07-05 NOTE — MAU Note (Signed)
Pt orts she found out she was pregnant [redacted] weeks ago. Has had increased n/v over several weeks. To the point now she cannot eat. Vomited 5-6 times today and report some weight loss. Also c/o feeling cold all the time. Denies any abd pian or cramping , vag bleedidng or discharge.

## 2020-07-06 DIAGNOSIS — Z3A08 8 weeks gestation of pregnancy: Secondary | ICD-10-CM

## 2020-07-06 DIAGNOSIS — K529 Noninfective gastroenteritis and colitis, unspecified: Secondary | ICD-10-CM

## 2020-07-06 DIAGNOSIS — O99611 Diseases of the digestive system complicating pregnancy, first trimester: Secondary | ICD-10-CM

## 2020-07-06 DIAGNOSIS — O10011 Pre-existing essential hypertension complicating pregnancy, first trimester: Secondary | ICD-10-CM | POA: Diagnosis not present

## 2020-07-06 LAB — URINALYSIS, ROUTINE W REFLEX MICROSCOPIC
Bilirubin Urine: NEGATIVE
Glucose, UA: NEGATIVE mg/dL
Hgb urine dipstick: NEGATIVE
Ketones, ur: 80 mg/dL — AB
Leukocytes,Ua: NEGATIVE
Nitrite: NEGATIVE
Protein, ur: NEGATIVE mg/dL
Specific Gravity, Urine: 1.015 (ref 1.005–1.030)
pH: 6 (ref 5.0–8.0)

## 2020-07-06 LAB — COMPREHENSIVE METABOLIC PANEL
ALT: 9 U/L (ref 0–44)
AST: 14 U/L — ABNORMAL LOW (ref 15–41)
Albumin: 3.7 g/dL (ref 3.5–5.0)
Alkaline Phosphatase: 57 U/L (ref 38–126)
Anion gap: 7 (ref 5–15)
BUN: 6 mg/dL (ref 6–20)
CO2: 23 mmol/L (ref 22–32)
Calcium: 8.9 mg/dL (ref 8.9–10.3)
Chloride: 104 mmol/L (ref 98–111)
Creatinine, Ser: 0.7 mg/dL (ref 0.44–1.00)
GFR, Estimated: >60 mL/min (ref >60)
Glucose, Bld: 82 mg/dL (ref 70–99)
Potassium: 3 mmol/L — ABNORMAL LOW (ref 3.5–5.1)
Sodium: 134 mmol/L — ABNORMAL LOW (ref 135–145)
Total Bilirubin: 0.5 mg/dL (ref 0.3–1.2)
Total Protein: 7.1 g/dL (ref 6.5–8.1)

## 2020-07-06 LAB — TSH: TSH: 0.322 u[IU]/mL — ABNORMAL LOW (ref 0.350–4.500)

## 2020-07-06 LAB — LIPASE, BLOOD: Lipase: 23 U/L (ref 11–51)

## 2020-07-06 MED ORDER — PROMETHAZINE HCL 25 MG PO TABS: 25.0000 mg | ORAL_TABLET | Freq: Once | ORAL | Status: DC

## 2020-07-06 MED ORDER — POTASSIUM CHLORIDE ER 10 MEQ PO TBCR: 10.0000 meq | EXTENDED_RELEASE_TABLET | Freq: Every day | ORAL | 0 refills | Status: DC

## 2020-07-06 MED ORDER — DOXYLAMINE-PYRIDOXINE 10-10 MG PO TBEC: 1.0000 | DELAYED_RELEASE_TABLET | Freq: Every day | ORAL | 1 refills | Status: DC

## 2020-07-06 NOTE — Discharge Instructions (Signed)
Food Choices to Help Relieve Diarrhea, Adult Diarrhea can make you feel weak and cause you to become dehydrated. It is important to choose the right foods and drinks to:  Relieve diarrhea.  Replace lost fluids and nutrients.  Prevent dehydration. What are tips for following this plan? Relieving diarrhea  Avoid foods that make your diarrhea worse. These may include: ? Foods and beverages sweetened with high-fructose corn syrup, honey, or sweeteners such as xylitol, sorbitol, and mannitol. ? Fried, greasy, or spicy foods. ? Raw fruits and vegetables.  Eat foods that are rich in probiotics. These include foods such as yogurt and fermented milk products. Probiotics can help increase healthy bacteria in your stomach and intestines (gastrointestinal tract or GI tract). This may help digestion and stop diarrhea.  If you have lactose intolerance, avoid dairy products. These may make your diarrhea worse.  Take medicine to help stop diarrhea only as told by your health care provider. Replacing nutrients  Eat bland, easy-to-digest foods in small amounts as you are able, until your diarrhea starts to get better. These foods include bananas, applesauce, rice, toast, and crackers.  Gradually reintroduce nutrient-rich foods as tolerated or as told by your health care provider. This includes: ? Well-cooked protein foods, such as eggs, lean meats like fish or chicken without skin, and tofu. ? Peeled, seeded, and soft-cooked fruits and vegetables. ? Low-fat dairy products. ? Whole grains.  Take vitamin and mineral supplements as told by your health care provider.   Preventing dehydration  Start by sipping water or a solution to prevent dehydration (oral rehydration solution, ORS). This is a drink that helps replace fluids and minerals your body has lost. You can buy an ORS at pharmacies and retail stores.  Try to drink at least 8-10 cups (2,000-2,500 mL) of fluid each day to help replace lost  fluids. If you have urine that is pale yellow, you are getting enough fluids.  You may drink other liquids in addition to water, such as fruit juice that you have added water to (diluted fruit juice) or low-calorie sports drinks, as tolerated or as told by your health care provider.  Avoid drinks with caffeine, such as coffee, tea, or soft drinks.  Avoid alcohol.   Summary  When you have diarrhea, it is important to choose the right foods and drinks to relieve diarrhea, to replace lost fluids and nutrients, and to prevent dehydration.  Make sure you drink enough fluid to keep your urine pale yellow.  You may benefit from eating bland foods at first. Gradually reintroduce healthy, nutrient-rich foods as tolerated or as told by your health care provider.  Avoid foods that make your diarrhea worse, such as fried, greasy, or spicy foods. This information is not intended to replace advice given to you by your health care provider. Make sure you discuss any questions you have with your health care provider. Document Revised: 04/01/2019 Document Reviewed: 04/01/2019 Elsevier Patient Education  2021 Elsevier Inc.   Hypokalemia Hypokalemia means that the amount of potassium in the blood is lower than normal. Potassium is a chemical (electrolyte) that helps regulate the amount of fluid in the body. It also stimulates muscle tightening (contraction) and helps nerves work properly. Normally, most of the body's potassium is inside cells, and only a very small amount is in the blood. Because the amount in the blood is so small, minor changes to potassium levels in the blood can be life-threatening. What are the causes? This condition may be caused by:  Antibiotic medicine.  Diarrhea or vomiting. Taking too much of a medicine that helps you have a bowel movement (laxative) can cause diarrhea and lead to hypokalemia.  Chronic kidney disease (CKD).  Medicines that help the body get rid of excess  fluid (diuretics).  Eating disorders, such as bulimia.  Low magnesium levels in the body.  Sweating a lot. What are the signs or symptoms? Symptoms of this condition include:  Weakness.  Constipation.  Fatigue.  Muscle cramps.  Mental confusion.  Skipped heartbeats or irregular heartbeat (palpitations).  Tingling or numbness. How is this diagnosed? This condition is diagnosed with a blood test. How is this treated? This condition may be treated by:  Taking potassium supplements by mouth.  Adjusting the medicines that you take.  Eating more foods that contain a lot of potassium. If your potassium level is very low, you may need to get potassium through an IV and be monitored in the hospital. Follow these instructions at home:  Take over-the-counter and prescription medicines only as told by your health care provider. This includes vitamins and supplements.  Eat a healthy diet. A healthy diet includes fresh fruits and vegetables, whole grains, healthy fats, and lean proteins.  If instructed, eat more foods that contain a lot of potassium. This includes: ? Nuts, such as peanuts and pistachios. ? Seeds, such as sunflower seeds and pumpkin seeds. ? Peas, lentils, and lima beans. ? Whole grain and bran cereals and breads. ? Fresh fruits and vegetables, such as apricots, avocado, bananas, cantaloupe, kiwi, oranges, tomatoes, asparagus, and potatoes. ? Orange juice. ? Tomato juice. ? Red meats. ? Yogurt.  Keep all follow-up visits as told by your health care provider. This is important.   Contact a health care provider if you:  Have weakness that gets worse.  Feel your heart pounding or racing.  Vomit.  Have diarrhea.  Have diabetes (diabetes mellitus) and you have trouble keeping your blood sugar (glucose) in your target range. Get help right away if you:  Have chest pain.  Have shortness of breath.  Have vomiting or diarrhea that lasts for more than 2  days.  Faint. Summary  Hypokalemia means that the amount of potassium in the blood is lower than normal.  This condition is diagnosed with a blood test.  Hypokalemia may be treated by taking potassium supplements, adjusting the medicines that you take, or eating more foods that are high in potassium.  If your potassium level is very low, you may need to get potassium through an IV and be monitored in the hospital. This information is not intended to replace advice given to you by your health care provider. Make sure you discuss any questions you have with your health care provider. Document Revised: 09/26/2017 Document Reviewed: 09/26/2017 Elsevier Patient Education  2021 Elsevier Inc.  Hypertension During Pregnancy Hypertension is also called high blood pressure. High blood pressure means that the force of the blood moving in your body is high enough to cause problems for you and your baby. Different types of high blood pressure can happen during pregnancy. The types are:  High blood pressure before you got pregnant. This is called chronic hypertension.  This can continue during your pregnancy. Your doctor will want to keep checking your blood pressure. You may need medicine to control your blood pressure while you are pregnant. You will need follow-up visits after you have your baby.  High blood pressure that goes up during pregnancy when it was normal before. This  is called gestational hypertension. It will often get better after you have your baby, but your doctor will need to watch your blood pressure to make sure that it is getting better.  You may develop high blood pressure after giving birth. This is called postpartum hypertension. This often occurs within 48 hours after childbirth but may occur up to 6 weeks after giving birth. Very high blood pressure during pregnancy is an emergency that needs treatment right away. How does this affect me? If you have high blood pressure during  pregnancy, you have a higher chance of developing high blood pressure:  As you get older.  If you get pregnant again. In some cases, high blood pressure during pregnancy can cause:  Stroke.  Heart attack.  Damage to the kidneys, lungs, or liver.  Preeclampsia.  HELLP syndrome.  Seizures.  Problems with the placenta. How does this affect my baby? Your baby may:  Be born early.  Not weigh as much as he or she should.  Not handle labor well, leading to a C-section. This condition may also result in a baby's death before birth (stillbirth). What are the risks?  Having high blood pressure during a past pregnancy.  Being overweight.  Being age 51 or older.  Being pregnant for the first time.  Being pregnant with more than one baby.  Becoming pregnant using fertility methods, such as IVF.  Having other problems, such as diabetes or kidney disease. What can I do to lower my risk?  Keep a healthy weight.  Eat a healthy diet.  Follow what your doctor tells you about treating any medical problems that you had before you got pregnant. It is very important to go to all of your doctor visits. Your doctor will check your blood pressure and make sure that your pregnancy is progressing as it should. Treatment should start early if a problem is found.   How is this treated? Treatment for high blood pressure during pregnancy can vary. It depends on the type of high blood pressure you have and how serious it is.  If you were taking medicine for your blood pressure before you got pregnant, talk with your doctor. You may need to change the medicine during pregnancy if it is not safe for your baby.  If your blood pressure goes up during pregnancy, your doctor may order medicine to treat this.  If you are at risk for preeclampsia, your doctor may tell you to take a low-dose aspirin while you are pregnant.  If you have very high blood pressure, you may need to stay in the hospital  so you and your baby can be watched closely. You may also need to take medicine to lower your blood pressure.  In some cases, if your condition gets worse, you may need to have your baby early. Follow these instructions at home: Eating and drinking  Drink enough fluid to keep your pee (urine) pale yellow.  Avoid caffeine.   Lifestyle  Do not smoke or use any products that contain nicotine or tobacco. If you need help quitting, ask your doctor.  Do not use alcohol or drugs.  Avoid stress.  Rest and get plenty of sleep.  Regular exercise can help. Ask your doctor what kinds of exercise are best for you. General instructions  Take over-the-counter and prescription medicines only as told by your doctor.  Keep all prenatal and follow-up visits. Contact a doctor if:  You have symptoms that your doctor told you to watch  for, such as: ? Headaches. ? A feeling like you may vomit (nausea). ? Vomiting. ? Belly (abdominal) pain. ? Feeling dizzy or light-headed. Get help right away if:  You have symptoms of serious problems, such as: ? Very bad belly pain that does not get better with treatment. ? A very bad headache that does not get better. ? Blurry vision. ? Double vision. ? Vomiting that does not get better. ? Sudden, fast weight gain. ? Sudden swelling in your hands, ankles, or face. ? Bleeding from your vagina. ? Blood in your pee. ? Shortness of breath. ? Chest pain. ? Weakness on one side of your body. ? Trouble talking.  Your baby is not moving as much as usual. These symptoms may be an emergency. Get help right away. Call your local emergency services (911 in the [place]  Do not wait to see if the symptoms will go away.  Do not drive yourself to the hospital. Summary  High blood pressure is also called hypertension.  High blood pressure means that the force of the blood moving in your body is high enough to cause problems for you and your baby.  Get help  right away if you have symptoms of serious problems due to high blood pressure.  Keep all prenatal and follow-up visits. This information is not intended to replace advice given to you by your health care provider. Make sure you discuss any questions you have with your health care provider. Document Revised: 11/06/2019 Document Reviewed: 11/06/2019 Elsevier Patient Education  2021 [employer].

## 2020-07-12 ENCOUNTER — Other Ambulatory Visit: Payer: Self-pay

## 2020-07-12 ENCOUNTER — Ambulatory Visit (INDEPENDENT_AMBULATORY_CARE_PROVIDER_SITE_OTHER): Payer: Medicaid Other

## 2020-07-12 ENCOUNTER — Other Ambulatory Visit (HOSPITAL_COMMUNITY): Payer: Self-pay

## 2020-07-12 VITALS — BP 168/92 | HR 93 | Ht 64.0 in | Wt 190.1 lb

## 2020-07-12 DIAGNOSIS — Z3481 Encounter for supervision of other normal pregnancy, first trimester: Secondary | ICD-10-CM | POA: Diagnosis not present

## 2020-07-12 DIAGNOSIS — O3680X0 Pregnancy with inconclusive fetal viability, not applicable or unspecified: Secondary | ICD-10-CM

## 2020-07-12 DIAGNOSIS — I10 Essential (primary) hypertension: Secondary | ICD-10-CM

## 2020-07-12 DIAGNOSIS — Z3491 Encounter for supervision of normal pregnancy, unspecified, first trimester: Secondary | ICD-10-CM | POA: Insufficient documentation

## 2020-07-12 DIAGNOSIS — Z3A09 9 weeks gestation of pregnancy: Secondary | ICD-10-CM | POA: Diagnosis not present

## 2020-07-12 DIAGNOSIS — O0990 Supervision of high risk pregnancy, unspecified, unspecified trimester: Secondary | ICD-10-CM | POA: Insufficient documentation

## 2020-07-12 MED ORDER — NIFEDIPINE ER OSMOTIC RELEASE 30 MG PO TB24
30.0000 mg | ORAL_TABLET | Freq: Every day | ORAL | 2 refills | Status: DC
  Filled 2020-07-12: qty 30, 30d supply, fill #0

## 2020-07-12 NOTE — Progress Notes (Signed)
New OB Intake  I connected with  Natalie Chase on 07/12/20 at  1:15 PM EDT by in office and verified that I am speaking with the correct person using two identifiers. Nurse is located at [facility] and pt is located at [place].  I discussed the limitations, risks, security and privacy concerns of performing an evaluation and management service by telephone and the availability of in person appointments. I also discussed with the patient that there may be a patient responsible charge related to this service. The patient expressed understanding and agreed to proceed.  I explained I am completing New OB Intake today. We discussed her EDD of 02/12/21 that is based on LMP of 05/08/20. Pt is G4/P2. I reviewed her allergies, medications, Medical/Surgical/OB history, and appropriate screenings. I informed her of [facility] services. Based on history, this is a/an complicated by hypertension pregnancy.  Patient Active Problem List   Diagnosis Date Noted  . Encounter for induction of labor 05/10/2019  . Lupus (systemic lupus erythematosus) (HCC)   . Sickle cell trait in mother affecting pregnancy (HCC) 11/14/2018  . Supervision of high risk pregnancy, antepartum 10/29/2018  . Chronic hypertension affecting pregnancy 10/29/2018  . Hypertension 06/11/2017    Concerns addressed today  Delivery Plans:  Plans to deliver at [facility].   MyChart/Babyscripts MyChart access verified. I explained pt will have some visits in office and some virtually. Babyscripts instructions given and order placed. Patient verifies receipt of registration text/e-mail. Account successfully created and app downloaded.  Blood Pressure Cuff Patient has cuff at home Explained after first prenatal appt pt will check weekly and document in Babyscripts.  Anatomy US Explained first scheduled [place] will be around 19 weeks. Dating and viability scan done today  Labs Discussed Avelina Laine genetic screening with patient. Would like both Panorama and  Horizon drawn at new OB visit. Routine prenatal labs needed.  Covid Vaccine Patient has covid vaccine.   Social Determinants of Health . Food Insecurity: Patient denies food insecurity. . WIC Referral: Patient is interested in referral to [facility].  . Transportation: Patient denies transportation needs. . Childcare: Discussed no children allowed at ultrasound appointments. Offered childcare services; patient expresses need for childcare services. Childcare scheduled for appropriate appointments and information given to patient.  First visit review I reviewed new OB appt with pt. I explained she will have a pelvic exam, ob bloodwork with genetic screening, and PAP smear. Explained pt will be seen by Peggy Constant at first visit; encounter routed to appropriate provider. Explained that patient will be seen by pregnancy navigator following visit with provider.  Hamilton Capri, RN 07/12/2020  10:20 AM

## 2020-07-12 NOTE — Progress Notes (Signed)
Patient was assessed and managed by nursing staff during this encounter. I have reviewed the chart and agree with the documentation and plan. I have also made any necessary editorial changes.  Jaynie Collins, MD 07/12/2020 1:50 PM

## 2020-07-14 ENCOUNTER — Other Ambulatory Visit (HOSPITAL_COMMUNITY): Payer: Self-pay

## 2020-07-20 ENCOUNTER — Ambulatory Visit (INDEPENDENT_AMBULATORY_CARE_PROVIDER_SITE_OTHER): Payer: Medicaid Other | Admitting: Obstetrics and Gynecology

## 2020-07-20 ENCOUNTER — Encounter: Payer: Self-pay | Admitting: Obstetrics and Gynecology

## 2020-07-20 ENCOUNTER — Other Ambulatory Visit: Payer: Self-pay

## 2020-07-20 ENCOUNTER — Other Ambulatory Visit (HOSPITAL_COMMUNITY): Payer: Self-pay

## 2020-07-20 ENCOUNTER — Ambulatory Visit (INDEPENDENT_AMBULATORY_CARE_PROVIDER_SITE_OTHER): Payer: Medicaid Other | Admitting: Licensed Clinical Social Worker

## 2020-07-20 ENCOUNTER — Other Ambulatory Visit (HOSPITAL_COMMUNITY)
Admission: RE | Admit: 2020-07-20 | Discharge: 2020-07-20 | Disposition: A | Discharge status: Home / Self Care | Payer: Medicaid Other | Source: Ambulatory Visit | Attending: Obstetrics and Gynecology | Admitting: Obstetrics and Gynecology

## 2020-07-20 VITALS — BP 163/83 | HR 88 | Wt 195.1 lb

## 2020-07-20 DIAGNOSIS — O99340 Other mental disorders complicating pregnancy, unspecified trimester: Secondary | ICD-10-CM | POA: Diagnosis not present

## 2020-07-20 DIAGNOSIS — O09521 Supervision of elderly multigravida, first trimester: Secondary | ICD-10-CM

## 2020-07-20 DIAGNOSIS — F32A Depression, unspecified: Secondary | ICD-10-CM

## 2020-07-20 DIAGNOSIS — O0990 Supervision of high risk pregnancy, unspecified, unspecified trimester: Secondary | ICD-10-CM

## 2020-07-20 DIAGNOSIS — M329 Systemic lupus erythematosus, unspecified: Secondary | ICD-10-CM

## 2020-07-20 DIAGNOSIS — O10919 Unspecified pre-existing hypertension complicating pregnancy, unspecified trimester: Secondary | ICD-10-CM

## 2020-07-20 DIAGNOSIS — O99019 Anemia complicating pregnancy, unspecified trimester: Secondary | ICD-10-CM

## 2020-07-20 DIAGNOSIS — O99210 Obesity complicating pregnancy, unspecified trimester: Secondary | ICD-10-CM

## 2020-07-20 DIAGNOSIS — D573 Sickle-cell trait: Secondary | ICD-10-CM

## 2020-07-20 LAB — OB RESULTS CONSOLE GC/CHLAMYDIA: Gonorrhea: NEGATIVE

## 2020-07-20 LAB — HEPATITIS C ANTIBODY: HCV Ab: NEGATIVE

## 2020-07-20 MED ORDER — NIFEDIPINE ER OSMOTIC RELEASE 30 MG PO TB24
30.0000 mg | ORAL_TABLET | Freq: Two times a day (BID) | ORAL | 3 refills | Status: DC
  Filled 2020-07-20 – 2020-08-16 (×2): qty 60, 30d supply, fill #0

## 2020-07-20 MED ORDER — ASPIRIN 81 MG PO TBEC
81.0000 mg | DELAYED_RELEASE_TABLET | Freq: Every day | ORAL | 2 refills | Status: DC
  Filled 2020-07-20: qty 30, 30d supply, fill #0
  Filled 2020-08-16: qty 30, 30d supply, fill #1
  Filled 2020-09-06 – 2020-09-09 (×3): qty 30, 30d supply, fill #2
  Filled 2020-10-13: qty 30, 30d supply, fill #3
  Filled 2020-10-13: qty 30, 30d supply, fill #0
  Filled 2020-11-18: qty 30, 30d supply, fill #1
  Filled 2020-12-20 – 2020-12-28 (×2): qty 30, 30d supply, fill #2

## 2020-07-20 MED ORDER — DOXYLAMINE-PYRIDOXINE 10-10 MG PO TBEC
2.0000 | DELAYED_RELEASE_TABLET | Freq: Every day | ORAL | 5 refills | Status: DC
  Filled 2020-07-20: qty 100, 25d supply, fill #0

## 2020-07-20 MED ORDER — SERTRALINE HCL 50 MG PO TABS
50.0000 mg | ORAL_TABLET | Freq: Every day | ORAL | 6 refills | Status: AC
  Filled 2020-07-20: qty 30, 30d supply, fill #0
  Filled 2020-09-06: qty 30, 30d supply, fill #1
  Filled 2020-10-13: qty 30, 30d supply, fill #2
  Filled 2020-10-13: qty 30, 30d supply, fill #0
  Filled 2020-11-18: qty 30, 30d supply, fill #1
  Filled 2020-12-20 – 2020-12-28 (×2): qty 30, 30d supply, fill #2
  Filled 2021-02-03: qty 30, 30d supply, fill #3
  Filled 2021-04-11: qty 30, 30d supply, fill #4

## 2020-07-20 MED ORDER — PROMETHAZINE HCL 25 MG PO TABS
25.0000 mg | ORAL_TABLET | Freq: Four times a day (QID) | ORAL | 2 refills | Status: DC | PRN
  Filled 2020-07-20: qty 30, 8d supply, fill #0

## 2020-07-20 NOTE — Progress Notes (Signed)
Starting to feel like she may need to take zoloft again.

## 2020-07-20 NOTE — Progress Notes (Signed)
Subjective:    Natalie Chase is a [id number] [redacted]w[redacted]d being seen today for her first obstetrical visit.  Her obstetrical history is significant for advanced maternal age, obesity and CHTN, lupus, sickel cell trait and short interval between pregnancies (SVD 05/2019). Patient does intend to breast feed. Pregnancy history fully reviewed.  Patient reports no complaints.  Vitals:   07/20/20 1321  BP: (!) 163/83  Pulse: 88  Weight: 195 lb 1.6 oz (88.5 kg)    HISTORY: OB History  Gravida Para Term Preterm AB Living  4 2 2   1 2   SAB IAB Ectopic Multiple Live Births    1   0 2    # Outcome Date GA Lbr Len/2nd Weight Sex Delivery Anes PTL Lv  4 Current           3 Term 05/11/19 [redacted]w[redacted]d 04:00 / 00:53 7 lb 1.4 oz (3.215 kg) M Vag-Spont EPI  LIV  2 Term 11/30/02 [redacted]w[redacted]d  6 lb 14 oz (3.118 kg) M Vag-Spont   LIV  1 IAB            Past Medical History:  Diagnosis Date  . Asthma   . Hypertension   . Lupus (systemic lupus erythematosus) (HCC)   . Pre-diabetes 05/2020  . Sickle cell trait (HCC)   . Vaginal Pap smear, abnormal    Past Surgical History:  Procedure Laterality Date  . CHOLECYSTECTOMY     Family History  Problem Relation Age of Onset  . Achondroplasia Mother   . Lupus Mother   . Sickle cell anemia Mother   . Hypertension Father   . Stroke Father   . Cancer Other   . Heart disease Maternal Grandmother      Exam    Uterus:     Pelvic Exam:    Perineum: Normal Perineum   Vulva: normal   Vagina:  normal mucosa, normal discharge   pH:    Cervix: multiparous appearance and cervix is closed and long   Adnexa: no mass, fullness, tenderness   Bony Pelvis: gynecoid  System: Breast:  normal appearance, no masses or tenderness   Skin: normal coloration and turgor, no rashes    Neurologic: oriented, no focal deficits   Extremities: normal strength, tone, and muscle mass   HEENT extra ocular movement intact   Mouth/Teeth mucous membranes moist, pharynx normal without  lesions and dental hygiene good   Neck supple and no masses   Cardiovascular: regular rate and rhythm   Respiratory:  appears well, vitals normal, no respiratory distress, acyanotic, normal RR, chest clear, no wheezing, crepitations, rhonchi, normal symmetric air entry   Abdomen: soft, non-tender; bowel sounds normal; no masses,  no organomegaly   Urinary:       Assessment:    Pregnancy: 06/2020 Patient Active Problem List   Diagnosis Date Noted  . Supervision of high risk pregnancy, antepartum 07/12/2020  . Lupus (systemic lupus erythematosus) (HCC)   . Sickle cell trait in mother affecting pregnancy (HCC) 11/14/2018  . Chronic hypertension affecting pregnancy 10/29/2018  . Hypertension 06/11/2017        Plan:     Initial labs drawn. Prenatal vitamins. Problem list reviewed and updated. Genetic Screening discussed : panorama ordered.  Ultrasound discussed; fetal survey: ordered. Will increase procardia to 30 BID Baseline labs ordered Rx ASA provided with instructions to start at 12 weeks Rx diclegis and phenergan provided  Follow up in 4 weeks. 50% of 30 min visit spent on counseling and  coordination of care.     Amadi Yoshino 07/20/2020

## 2020-07-20 NOTE — Patient Instructions (Signed)
 Obstetrics: Normal and Problem Pregnancies (7th ed., pp. 102-121). [place], [place]: [employer]."> Textbook of Family Medicine (9th ed., pp. 365-410). [place], [place]: [employer].">  First Trimester of Pregnancy  The first trimester of pregnancy starts on the first day of your last menstrual period until the end of week 12. This is months 1 through 3 of pregnancy. A week after a sperm fertilizes an egg, the egg will implant into the wall of the uterus and begin to develop into a baby. By the end of 12 weeks, all the baby's organs will be formed and the baby will be 2-3 inches in size. Body changes during your first trimester Your body goes through many changes during pregnancy. The changes vary and generally return to normal after your baby is born. Physical changes  You may gain or lose weight.  Your breasts may begin to grow larger and become tender. The tissue that surrounds your nipples (areola) may become darker.  Dark spots or blotches (chloasma or mask of pregnancy) may develop on your face.  You may have changes in your hair. These can include thickening or thinning of your hair or changes in texture. Health changes  You may feel nauseous, and you may vomit.  You may have heartburn.  You may develop headaches.  You may develop constipation.  Your gums may bleed and may be sensitive to brushing and flossing. Other changes  You may tire easily.  You may urinate more often.  Your menstrual periods will stop.  You may have a loss of appetite.  You may develop cravings for certain kinds of food.  You may have changes in your emotions from day to day.  You may have more vivid and strange dreams. Follow these instructions at home: Medicines  Follow your health care provider's instructions regarding medicine use. Specific medicines may be either safe or unsafe to take during pregnancy. Do not take any medicines unless told to by your health care provider.  Take  a prenatal vitamin that contains at least 600 micrograms (mcg) of folic acid. Eating and drinking  Eat a healthy diet that includes fresh fruits and vegetables, whole grains, good sources of protein such as meat, eggs, or tofu, and low-fat dairy products.  Avoid raw meat and unpasteurized juice, milk, and cheese. These carry germs that can harm you and your baby.  If you feel nauseous or you vomit: ? Eat 4 or 5 small meals a day instead of 3 large meals. ? Try eating a few soda crackers. ? Drink liquids between meals instead of during meals.  You may need to take these actions to prevent or treat constipation: ? Drink enough fluid to keep your urine pale yellow. ? Eat foods that are high in fiber, such as beans, whole grains, and fresh fruits and vegetables. ? Limit foods that are high in fat and processed sugars, such as fried or sweet foods. Activity  Exercise only as directed by your health care provider. Most people can continue their usual exercise routine during pregnancy. Try to exercise for 30 minutes at least 5 days a week.  Stop exercising if you develop pain or cramping in the lower abdomen or lower back.  Avoid exercising if it is very hot or humid or if you are at high altitude.  Avoid heavy lifting.  If you choose to, you may have sex unless your health care provider tells you not to. Relieving pain and discomfort  Wear a good support bra to relieve   breast tenderness.  Rest with your legs elevated if you have leg cramps or low back pain.  If you develop bulging veins (varicose veins) in your legs: ? Wear support hose as told by your health care provider. ? Elevate your feet for 15 minutes, 3-4 times a day. ? Limit salt in your diet. Safety  Wear your seat belt at all times when driving or riding in a car.  Talk with your health care provider if someone is verbally or physically abusive to you.  Talk with your health care provider if you are feeling sad or have  thoughts of hurting yourself. Lifestyle  Do not use hot tubs, steam rooms, or saunas.  Do not douche. Do not use tampons or scented sanitary pads.  Do not use herbal remedies, alcohol, illegal drugs, or medicines that are not approved by your health care provider. Chemicals in these products can harm your baby.  Do not use any products that contain nicotine or tobacco, such as cigarettes, e-cigarettes, and chewing tobacco. If you need help quitting, ask your health care provider.  Avoid cat litter boxes and soil used by cats. These carry germs that can cause birth defects in the baby and possibly loss of the unborn baby (fetus) by miscarriage or stillbirth. General instructions  During routine prenatal visits in the first trimester, your health care provider will do a physical exam, perform necessary tests, and ask you how things are going. Keep all follow-up visits. This is important.  Ask for help if you have counseling or nutritional needs during pregnancy. Your health care provider can offer advice or refer you to specialists for help with various needs.  Schedule a dentist appointment. At home, brush your teeth with a soft toothbrush. Floss gently.  Write down your questions. Take them to your prenatal visits. Where to find more information  American Pregnancy Association: americanpregnancy.org  American College of Obstetricians and Gynecologists: acog.org/en/Womens%20Health/Pregnancy  Office on Women's Health: womenshealth.gov/pregnancy Contact a health care provider if you have:  Dizziness.  A fever.  Mild pelvic cramps, pelvic pressure, or nagging pain in the abdominal area.  Nausea, vomiting, or diarrhea that lasts for 24 hours or longer.  A bad-smelling vaginal discharge.  Pain when you urinate.  Known exposure to a contagious illness, such as chickenpox, measles, Zika virus, HIV, or hepatitis. Get help right away if you have:  Spotting or bleeding from your  vagina.  Severe abdominal cramping or pain.  Shortness of breath or chest pain.  Any kind of trauma, such as from a fall or a car crash.  New or increased pain, swelling, or redness in an arm or leg. Summary  The first trimester of pregnancy starts on the first day of your last menstrual period until the end of week 12 (months 1 through 3).  Eating 4 or 5 small meals a day rather than 3 large meals may help to relieve nausea and vomiting.  Do not use any products that contain nicotine or tobacco, such as cigarettes, e-cigarettes, and chewing tobacco. If you need help quitting, ask your health care provider.  Keep all follow-up visits. This is important. This information is not intended to replace advice given to you by your health care provider. Make sure you discuss any questions you have with your health care provider. Document Revised: 07/23/2019 Document Reviewed: 05/29/2019 [employer] Patient Education  2021 [employer]   Second Trimester of Pregnancy  The second trimester of pregnancy is from week 13 through week   27. This is months 4 through 6 of pregnancy. The second trimester is often a time when you feel your best. Your body has adjusted to being pregnant, and you begin to feel better physically. During the second trimester:  Morning sickness has lessened or stopped completely.  You may have more energy.  You may have an increase in appetite. The second trimester is also a time when the unborn baby (fetus) is growing rapidly. At the end of the sixth month, the fetus may be up to 12 inches long and weigh about 1 pounds. You will likely begin to feel the baby move (quickening) between 16 and 20 weeks of pregnancy. Body changes during your second trimester Your body continues to go through many changes during your second trimester. The changes vary and generally return to normal after the baby is born. Physical changes  Your weight will continue to increase. You will  notice your lower abdomen bulging out.  You may begin to get stretch marks on your hips, abdomen, and breasts.  Your breasts will continue to grow and to become tender.  Dark spots or blotches (chloasma or mask of pregnancy) may develop on your face.  A dark line from your belly button to the pubic area (linea nigra) may appear.  You may have changes in your hair. These can include thickening of your hair, rapid growth, and changes in texture. Some people also have hair loss during or after pregnancy, or hair that feels dry or thin. Health changes  You may develop headaches.  You may have heartburn.  You may develop constipation.  You may develop hemorrhoids or swollen, bulging veins (varicose veins).  Your gums may bleed and may be sensitive to brushing and flossing.  You may urinate more often because the fetus is pressing on your bladder.  You may have back pain. This is caused by: ? Weight gain. ? Pregnancy hormones that are relaxing the joints in your pelvis. ? A shift in weight and the muscles that support your balance. Follow these instructions at home: Medicines  Follow your health care provider's instructions regarding medicine use. Specific medicines may be either safe or unsafe to take during pregnancy. Do not take any medicines unless approved by your health care provider.  Take a prenatal vitamin that contains at least 600 micrograms (mcg) of folic acid. Eating and drinking  Eat a healthy diet that includes fresh fruits and vegetables, whole grains, good sources of protein such as meat, eggs, or tofu, and low-fat dairy products.  Avoid raw meat and unpasteurized juice, milk, and cheese. These carry germs that can harm you and your baby.  You may need to take these actions to prevent or treat constipation: ? Drink enough fluid to keep your urine pale yellow. ? Eat foods that are high in fiber, such as beans, whole grains, and fresh fruits and  vegetables. ? Limit foods that are high in fat and processed sugars, such as fried or sweet foods. Activity  Exercise only as directed by your health care provider. Most people can continue their usual exercise routine during pregnancy. Try to exercise for 30 minutes at least 5 days a week. Stop exercising if you develop contractions in your uterus.  Stop exercising if you develop pain or cramping in the lower abdomen or lower back.  Avoid exercising if it is very hot or humid or if you are at a high altitude.  Avoid heavy lifting.  If you choose to, you may have   sex unless your health care provider tells you not to. Relieving pain and discomfort  Wear a supportive bra to prevent discomfort from breast tenderness.  Take warm sitz baths to soothe any pain or discomfort caused by hemorrhoids. Use hemorrhoid cream if your health care provider approves.  Rest with your legs raised (elevated) if you have leg cramps or low back pain.  If you develop varicose veins: ? Wear support hose as told by your health care provider. ? Elevate your feet for 15 minutes, 3-4 times a day. ? Limit salt in your diet. Safety  Wear your seat belt at all times when driving or riding in a car.  Talk with your health care provider if someone is verbally or physically abusive to you. Lifestyle  Do not use hot tubs, steam rooms, or saunas.  Do not douche. Do not use tampons or scented sanitary pads.  Avoid cat litter boxes and soil used by cats. These carry germs that can cause birth defects in the baby and possibly loss of the fetus by miscarriage or stillbirth.  Do not use herbal remedies, alcohol, illegal drugs, or medicines that are not approved by your health care provider. Chemicals in these products can harm your baby.  Do not use any products that contain nicotine or tobacco, such as cigarettes, e-cigarettes, and chewing tobacco. If you need help quitting, ask your health care provider. General  instructions  During a routine prenatal visit, your health care provider will do a physical exam and other tests. He or she will also discuss your overall health. Keep all follow-up visits. This is important.  Ask your health care provider for a referral to a local prenatal education class.  Ask for help if you have counseling or nutritional needs during pregnancy. Your health care provider can offer advice or refer you to specialists for help with various needs. Where to find more information  American Pregnancy Association: americanpregnancy.org  American College of Obstetricians and Gynecologists: acog.org/en/Womens%20Health/Pregnancy  Office on Women's Health: womenshealth.gov/pregnancy Contact a health care provider if you have:  A headache that does not go away when you take medicine.  Vision changes or you see spots in front of your eyes.  Mild pelvic cramps, pelvic pressure, or nagging pain in the abdominal area.  Persistent nausea, vomiting, or diarrhea.  A bad-smelling vaginal discharge or foul-smelling urine.  Pain when you urinate.  Sudden or extreme swelling of your face, hands, ankles, feet, or legs.  A fever. Get help right away if you:  Have fluid leaking from your vagina.  Have spotting or bleeding from your vagina.  Have severe abdominal cramping or pain.  Have difficulty breathing.  Have chest pain.  Have fainting spells.  Have not felt your baby move for the time period told by your health care provider.  Have new or increased pain, swelling, or redness in an arm or leg. Summary  The second trimester of pregnancy is from week 13 through week 27 (months 4 through 6).  Do not use herbal remedies, alcohol, illegal drugs, or medicines that are not approved by your health care provider. Chemicals in these products can harm your baby.  Exercise only as directed by your health care provider. Most people can continue their usual exercise routine  during pregnancy.  Keep all follow-up visits. This is important. This information is not intended to replace advice given to you by your health care provider. Make sure you discuss any questions you have with your health care   provider. Document Revised: 07/23/2019 Document Reviewed: 05/29/2019 [employer] Patient Education  2021 [employer]   Contraception Choices Contraception, also called birth control, refers to methods or devices that prevent pregnancy. Hormonal methods Contraceptive implant A contraceptive implant is a thin, plastic tube that contains a hormone that prevents pregnancy. It is different from an intrauterine device (IUD). It is inserted into the upper part of the arm by a health care provider. Implants can be effective for up to 3 years. Progestin-only injections Progestin-only injections are injections of progestin, a synthetic form of the hormone progesterone. They are given every 3 months by a health care provider. Birth control pills Birth control pills are pills that contain hormones that prevent pregnancy. They must be taken once a day, preferably at the same time each day. A prescription is needed to use this method of contraception. Birth control patch The birth control patch contains hormones that prevent pregnancy. It is placed on the skin and must be changed once a week for three weeks and removed on the fourth week. A prescription is needed to use this method of contraception. Vaginal ring A vaginal ring contains hormones that prevent pregnancy. It is placed in the vagina for three weeks and removed on the fourth week. After that, the process is repeated with a new ring. A prescription is needed to use this method of contraception. Emergency contraceptive Emergency contraceptives prevent pregnancy after unprotected sex. They come in pill form and can be taken up to 5 days after sex. They work best the sooner they are taken after having sex. Most emergency  contraceptives are available without a prescription. This method should not be used as your only form of birth control.   Barrier methods Female condom A female condom is a thin sheath that is worn over the penis during sex. Condoms keep sperm from going inside a woman's body. They can be used with a sperm-killing substance (spermicide) to increase their effectiveness. They should be thrown away after one use. Female condom A female condom is a soft, loose-fitting sheath that is put into the vagina before sex. The condom keeps sperm from going inside a woman's body. They should be thrown away after one use. Diaphragm A diaphragm is a soft, dome-shaped barrier. It is inserted into the vagina before sex, along with a spermicide. The diaphragm blocks sperm from entering the uterus, and the spermicide kills sperm. A diaphragm should be left in the vagina for 6-8 hours after sex and removed within 24 hours. A diaphragm is prescribed and fitted by a health care provider. A diaphragm should be replaced every 1-2 years, after giving birth, after gaining more than 15 lb (6.8 kg), and after pelvic surgery. Cervical cap A cervical cap is a round, soft latex or plastic cup that fits over the cervix. It is inserted into the vagina before sex, along with spermicide. It blocks sperm from entering the uterus. The cap should be left in place for 6-8 hours after sex and removed within 48 hours. A cervical cap must be prescribed and fitted by a health care provider. It should be replaced every 2 years. Sponge A sponge is a soft, circular piece of polyurethane foam with spermicide in it. The sponge helps block sperm from entering the uterus, and the spermicide kills sperm. To use it, you make it wet and then insert it into the vagina. It should be inserted before sex, left in for at least 6 hours after sex, and removed and thrown away   within 30 hours. Spermicides Spermicides are chemicals that kill or block sperm from  entering the cervix and uterus. They can come as a cream, jelly, suppository, foam, or tablet. A spermicide should be inserted into the vagina with an applicator at least 10-15 minutes before sex to allow time for it to work. The process must be repeated every time you have sex. Spermicides do not require a prescription.   Intrauterine contraception Intrauterine device (IUD) An IUD is a T-shaped device that is put in a woman's uterus. There are two types:  Hormone IUD.This type contains progestin, a synthetic form of the hormone progesterone. This type can stay in place for 3-5 years.  Copper IUD.This type is wrapped in copper wire. It can stay in place for 10 years. Permanent methods of contraception Female tubal ligation In this method, a woman's fallopian tubes are sealed, tied, or blocked during surgery to prevent eggs from traveling to the uterus. Hysteroscopic sterilization In this method, a small, flexible insert is placed into each fallopian tube. The inserts cause scar tissue to form in the fallopian tubes and block them, so sperm cannot reach an egg. The procedure takes about 3 months to be effective. Another form of birth control must be used during those 3 months. Female sterilization This is a procedure to tie off the tubes that carry sperm (vasectomy). After the procedure, the man can still ejaculate fluid (semen). Another form of birth control must be used for 3 months after the procedure. Natural planning methods Natural family planning In this method, a couple does not have sex on days when the woman could become pregnant. Calendar method In this method, the woman keeps track of the length of each menstrual cycle, identifies the days when pregnancy can happen, and does not have sex on those days. Ovulation method In this method, a couple avoids sex during ovulation. Symptothermal method This method involves not having sex during ovulation. The woman typically checks for  ovulation by watching changes in her temperature and in the consistency of cervical mucus. Post-ovulation method In this method, a couple waits to have sex until after ovulation. Where to find more information  Centers for Disease Control and Prevention: www.cdc.gov Summary  Contraception, also called birth control, refers to methods or devices that prevent pregnancy.  Hormonal methods of contraception include implants, injections, pills, patches, vaginal rings, and emergency contraceptives.  Barrier methods of contraception can include female condoms, female condoms, diaphragms, cervical caps, sponges, and spermicides.  There are two types of IUDs (intrauterine devices). An IUD can be put in a woman's uterus to prevent pregnancy for 3-5 years.  Permanent sterilization can be done through a procedure for males and females. Natural family planning methods involve nothaving sex on days when the woman could become pregnant. This information is not intended to replace advice given to you by your health care provider. Make sure you discuss any questions you have with your health care provider. Document Revised: 07/21/2019 Document Reviewed: 07/21/2019 Elsevier Patient Education  2021 [employer].   Breastfeeding  Choosing to breastfeed is one of the best decisions you can make for yourself and your baby. A change in hormones during pregnancy causes your breasts to make breast milk in your milk-producing glands. Hormones prevent breast milk from being released before your baby is born. They also prompt milk flow after birth. Once breastfeeding has begun, thoughts of your baby, as well as his or her sucking or crying, can stimulate the release of milk   from your milk-producing glands. Benefits of breastfeeding Research shows that breastfeeding offers many health benefits for infants and mothers. It also offers a cost-free and convenient way to feed your baby. For your baby  Your first milk  (colostrum) helps your baby's digestive system to function better.  Special cells in your milk (antibodies) help your baby to fight off infections.  Breastfed babies are less likely to develop asthma, allergies, obesity, or type 2 diabetes. They are also at lower risk for sudden infant death syndrome (SIDS).  Nutrients in breast milk are better able to meet your baby's needs compared to infant formula.  Breast milk improves your baby's brain development. For you  Breastfeeding helps to create a very special bond between you and your baby.  Breastfeeding is convenient. Breast milk costs nothing and is always available at the correct temperature.  Breastfeeding helps to burn calories. It helps you to lose the weight that you gained during pregnancy.  Breastfeeding makes your uterus return faster to its size before pregnancy. It also slows bleeding (lochia) after you give birth.  Breastfeeding helps to lower your risk of developing type 2 diabetes, osteoporosis, rheumatoid arthritis, cardiovascular disease, and breast, ovarian, uterine, and endometrial cancer later in life. Breastfeeding basics Starting breastfeeding  Find a comfortable place to sit or lie down, with your neck and back well-supported.  Place a pillow or a rolled-up blanket under your baby to bring him or her to the level of your breast (if you are seated). Nursing pillows are specially designed to help support your arms and your baby while you breastfeed.  Make sure that your baby's tummy (abdomen) is facing your abdomen.  Gently massage your breast. With your fingertips, massage from the outer edges of your breast inward toward the nipple. This encourages milk flow. If your milk flows slowly, you may need to continue this action during the feeding.  Support your breast with 4 fingers underneath and your thumb above your nipple (make the letter "C" with your hand). Make sure your fingers are well away from your nipple and  your baby's mouth.  Stroke your baby's lips gently with your finger or nipple.  When your baby's mouth is open wide enough, quickly bring your baby to your breast, placing your entire nipple and as much of the areola as possible into your baby's mouth. The areola is the colored area around your nipple. ? More areola should be visible above your baby's upper lip than below the lower lip. ? Your baby's lips should be opened and extended outward (flanged) to ensure an adequate, comfortable latch. ? Your baby's tongue should be between his or her lower gum and your breast.  Make sure that your baby's mouth is correctly positioned around your nipple (latched). Your baby's lips should create a seal on your breast and be turned out (everted).  It is common for your baby to suck about 2-3 minutes in order to start the flow of breast milk. Latching Teaching your baby how to latch onto your breast properly is very important. An improper latch can cause nipple pain, decreased milk supply, and poor weight gain in your baby. Also, if your baby is not latched onto your nipple properly, he or she may swallow some air during feeding. This can make your baby fussy. Burping your baby when you switch breasts during the feeding can help to get rid of the air. However, teaching your baby to latch on properly is still the best way to   prevent fussiness from swallowing air while breastfeeding. Signs that your baby has successfully latched onto your nipple  Silent tugging or silent sucking, without causing you pain. Infant's lips should be extended outward (flanged).  Swallowing heard between every 3-4 sucks once your milk has started to flow (after your let-down milk reflex occurs).  Muscle movement above and in front of his or her ears while sucking. Signs that your baby has not successfully latched onto your nipple  Sucking sounds or smacking sounds from your baby while breastfeeding.  Nipple pain. If you think  your baby has not latched on correctly, slip your finger into the corner of your baby's mouth to break the suction and place it between your baby's gums. Attempt to start breastfeeding again. Signs of successful breastfeeding Signs from your baby  Your baby will gradually decrease the number of sucks or will completely stop sucking.  Your baby will fall asleep.  Your baby's body will relax.  Your baby will retain a small amount of milk in his or her mouth.  Your baby will let go of your breast by himself or herself. Signs from you  Breasts that have increased in firmness, weight, and size 1-3 hours after feeding.  Breasts that are softer immediately after breastfeeding.  Increased milk volume, as well as a change in milk consistency and color by the fifth day of breastfeeding.  Nipples that are not sore, cracked, or bleeding. Signs that your baby is getting enough milk  Wetting at least 1-2 diapers during the first 24 hours after birth.  Wetting at least 5-6 diapers every 24 hours for the first week after birth. The urine should be clear or pale yellow by the age of 5 days.  Wetting 6-8 diapers every 24 hours as your baby continues to grow and develop.  At least 3 stools in a 24-hour period by the age of 5 days. The stool should be soft and yellow.  At least 3 stools in a 24-hour period by the age of 7 days. The stool should be seedy and yellow.  No loss of weight greater than 10% of birth weight during the first 3 days of life.  Average weight gain of 4-7 oz (113-198 g) per week after the age of 4 days.  Consistent daily weight gain by the age of 5 days, without weight loss after the age of 2 weeks. After a feeding, your baby may spit up a small amount of milk. This is normal. Breastfeeding frequency and duration Frequent feeding will help you make more milk and can prevent sore nipples and extremely full breasts (breast engorgement). Breastfeed when you feel the need to  reduce the fullness of your breasts or when your baby shows signs of hunger. This is called "breastfeeding on demand." Signs that your baby is hungry include:  Increased alertness, activity, or restlessness.  Movement of the head from side to side.  Opening of the mouth when the corner of the mouth or cheek is stroked (rooting).  Increased sucking sounds, smacking lips, cooing, sighing, or squeaking.  Hand-to-mouth movements and sucking on fingers or hands.  Fussing or crying. Avoid introducing a pacifier to your baby in the first 4-6 weeks after your baby is born. After this time, you may choose to use a pacifier. Research has shown that pacifier use during the first year of a baby's life decreases the risk of sudden infant death syndrome (SIDS). Allow your baby to feed on each breast as long as   he or she wants. When your baby unlatches or falls asleep while feeding from the first breast, offer the second breast. Because newborns are often sleepy in the first few weeks of life, you may need to awaken your baby to get him or her to feed. Breastfeeding times will vary from baby to baby. However, the following rules can serve as a guide to help you make sure that your baby is properly fed:  Newborns (babies 4 weeks of age or younger) may breastfeed every 1-3 hours.  Newborns should not go without breastfeeding for longer than 3 hours during the day or 5 hours during the night.  You should breastfeed your baby a minimum of 8 times in a 24-hour period. Breast milk pumping Pumping and storing breast milk allows you to make sure that your baby is exclusively fed your breast milk, even at times when you are unable to breastfeed. This is especially important if you go back to work while you are still breastfeeding, or if you are not able to be present during feedings. Your lactation consultant can help you find a method of pumping that works best for you and give you guidelines about how long it is  safe to store breast milk.      Caring for your breasts while you breastfeed Nipples can become dry, cracked, and sore while breastfeeding. The following recommendations can help keep your breasts moisturized and healthy:  Avoid using soap on your nipples.  Wear a supportive bra designed especially for nursing. Avoid wearing underwire-style bras or extremely tight bras (sports bras).  Air-dry your nipples for 3-4 minutes after each feeding.  Use only cotton bra pads to absorb leaked breast milk. Leaking of breast milk between feedings is normal.  Use lanolin on your nipples after breastfeeding. Lanolin helps to maintain your skin's normal moisture barrier. Pure lanolin is not harmful (not toxic) to your baby. You may also hand express a few drops of breast milk and gently massage that milk into your nipples and allow the milk to air-dry. In the first few weeks after giving birth, some women experience breast engorgement. Engorgement can make your breasts feel heavy, warm, and tender to the touch. Engorgement peaks within 3-5 days after you give birth. The following recommendations can help to ease engorgement:  Completely empty your breasts while breastfeeding or pumping. You may want to start by applying warm, moist heat (in the shower or with warm, water-soaked hand towels) just before feeding or pumping. This increases circulation and helps the milk flow. If your baby does not completely empty your breasts while breastfeeding, pump any extra milk after he or she is finished.  Apply ice packs to your breasts immediately after breastfeeding or pumping, unless this is too uncomfortable for you. To do this: ? Put ice in a plastic bag. ? Place a towel between your skin and the bag. ? Leave the ice on for 20 minutes, 2-3 times a day.  Make sure that your baby is latched on and positioned properly while breastfeeding. If engorgement persists after 48 hours of following these recommendations,  contact your health care provider or a lactation consultant. Overall health care recommendations while breastfeeding  Eat 3 healthy meals and 3 snacks every day. Well-nourished mothers who are breastfeeding need an additional 450-500 calories a day. You can meet this requirement by increasing the amount of a balanced diet that you eat.  Drink enough water to keep your urine pale yellow or clear.  Rest   often, relax, and continue to take your prenatal vitamins to prevent fatigue, stress, and low vitamin and mineral levels in your body (nutrient deficiencies).  Do not use any products that contain nicotine or tobacco, such as cigarettes and e-cigarettes. Your baby may be harmed by chemicals from cigarettes that pass into breast milk and exposure to secondhand smoke. If you need help quitting, ask your health care provider.  Avoid alcohol.  Do not use illegal drugs or marijuana.  Talk with your health care provider before taking any medicines. These include over-the-counter and prescription medicines as well as vitamins and herbal supplements. Some medicines that may be harmful to your baby can pass through breast milk.  It is possible to become pregnant while breastfeeding. If birth control is desired, ask your health care provider about options that will be safe while breastfeeding your baby. Where to find more information: [employer]: www.llli.org Contact a health care provider if:  You feel like you want to stop breastfeeding or have become frustrated with breastfeeding.  Your nipples are cracked or bleeding.  Your breasts are red, tender, or warm.  You have: ? Painful breasts or nipples. ? A swollen area on either breast. ? A fever or chills. ? Nausea or vomiting. ? Drainage other than breast milk from your nipples.  Your breasts do not become full before feedings by the fifth day after you give birth.  You feel sad and depressed.  Your baby is: ? Too  sleepy to eat well. ? Having trouble sleeping. ? More than 1 week old and wetting fewer than 6 diapers in a 24-hour period. ? Not gaining weight by 5 days of age.  Your baby has fewer than 3 stools in a 24-hour period.  Your baby's skin or the white parts of his or her eyes become yellow. Get help right away if:  Your baby is overly tired (lethargic) and does not want to wake up and feed.  Your baby develops an unexplained fever. Summary  Breastfeeding offers many health benefits for infant and mothers.  Try to breastfeed your infant when he or she shows early signs of hunger.  Gently tickle or stroke your baby's lips with your finger or nipple to allow the baby to open his or her mouth. Bring the baby to your breast. Make sure that much of the areola is in your baby's mouth. Offer one side and burp the baby before you offer the other side.  Talk with your health care provider or lactation consultant if you have questions or you face problems as you breastfeed. This information is not intended to replace advice given to you by your health care provider. Make sure you discuss any questions you have with your health care provider. Document Revised: 05/10/2017 Document Reviewed: 03/17/2016 Elsevier Patient Education  2021 Elsevier Inc.  

## 2020-07-21 LAB — COMPREHENSIVE METABOLIC PANEL
ALT: 7 IU/L (ref 0–32)
AST: 9 IU/L (ref 0–40)
Albumin/Globulin Ratio: 1.9 (ref 1.2–2.2)
Albumin: 4.5 g/dL (ref 3.8–4.8)
Alkaline Phosphatase: 67 IU/L (ref 44–121)
BUN/Creatinine Ratio: 10 (ref 9–23)
BUN: 6 mg/dL (ref 6–20)
Bilirubin Total: <0.2 mg/dL (ref 0.0–1.2)
CO2: 22 mmol/L (ref 20–29)
Calcium: 9.1 mg/dL (ref 8.7–10.2)
Chloride: 104 mmol/L (ref 96–106)
Creatinine, Ser: 0.59 mg/dL (ref 0.57–1.00)
Globulin, Total: 2.4 g/dL (ref 1.5–4.5)
Glucose: 68 mg/dL (ref 65–99)
Potassium: 4 mmol/L (ref 3.5–5.2)
Sodium: 139 mmol/L (ref 134–144)
Total Protein: 6.9 g/dL (ref 6.0–8.5)
eGFR: 119 mL/min/{1.73_m2} (ref >59)

## 2020-07-21 LAB — OBSTETRIC PANEL, INCLUDING HIV
Antibody Screen: NEGATIVE
Basophils Absolute: 0.1 10*3/uL (ref 0.0–0.2)
Basos: 1 %
EOS (ABSOLUTE): 0.3 10*3/uL (ref 0.0–0.4)
Eos: 2 %
HIV Screen 4th Generation wRfx: NONREACTIVE
Hematocrit: 37.2 % (ref 34.0–46.6)
Hemoglobin: 12 g/dL (ref 11.1–15.9)
Hepatitis B Surface Ag: NEGATIVE
Immature Grans (Abs): 0.1 10*3/uL (ref 0.0–0.1)
Immature Granulocytes: 1 %
Lymphocytes Absolute: 3.3 10*3/uL — ABNORMAL HIGH (ref 0.7–3.1)
Lymphs: 23 %
MCH: 25.4 pg — ABNORMAL LOW (ref 26.6–33.0)
MCHC: 32.3 g/dL (ref 31.5–35.7)
MCV: 79 fL (ref 79–97)
Monocytes Absolute: 1.1 10*3/uL — ABNORMAL HIGH (ref 0.1–0.9)
Monocytes: 7 %
Neutrophils Absolute: 9.7 10*3/uL — ABNORMAL HIGH (ref 1.4–7.0)
Neutrophils: 66 %
Platelets: 351 10*3/uL (ref 150–450)
RBC: 4.72 x10E6/uL (ref 3.77–5.28)
RDW: 16.9 % — ABNORMAL HIGH (ref 11.7–15.4)
RPR Ser Ql: NONREACTIVE
Rh Factor: POSITIVE
Rubella Antibodies, IGG: 11.2 index (ref >0.99)
WBC: 14.5 10*3/uL — ABNORMAL HIGH (ref 3.4–10.8)

## 2020-07-21 LAB — HEPATITIS C ANTIBODY: Hep C Virus Ab: <0.1 s/co ratio (ref 0.0–0.9)

## 2020-07-21 LAB — HEMOGLOBIN A1C
Est. average glucose Bld gHb Est-mCnc: 108 mg/dL
Hgb A1c MFr Bld: 5.4 % (ref 4.8–5.6)

## 2020-07-21 LAB — CERVICOVAGINAL ANCILLARY ONLY
Chlamydia: NEGATIVE
Comment: NEGATIVE
Comment: NORMAL
Neisseria Gonorrhea: NEGATIVE

## 2020-07-21 LAB — PROTEIN / CREATININE RATIO, URINE
Creatinine, Urine: 85 mg/dL
Protein, Ur: 10.9 mg/dL
Protein/Creat Ratio: 128 mg/g creat (ref 0–200)

## 2020-07-21 NOTE — BH Specialist Note (Signed)
Integrated Behavioral Health Initial In-Person Visit  MRN: [id number] Name: Manjit Bufano  Number of Integrated Behavioral Health Clinician visits:: 1/6 Session Start time: 2:00pm  Session End time: 2:50pm Total time: 50 minutes via mychart   Types of Service: Individual psychotherapy   Interpretor:no  Interpretor Name and Language: yes    Warm Hand Off Completed.       Subjective: Keyara Ent is a 40 y.o. female accompanied by boyfriend and Father of 2 younger children  Patient was self referred . Patient reports the following symptoms/concerns: relationship conflict, excessive levels of stress,  Duration of problem: approx 2 years; Severity of problem: mild   Objective: Mood: annoyed  and Affect: appropriate  Risk of harm to self or others: no risk of harm to self or others   Life Context: Family and Social:Lives with two sons in [place] [employer]/Work: Tourist information centre manager  Self-Care: None  Life Changes: Unplanned pregnancy   Patient and/or Family's Strengths/Protective Factors: Secured housing   Goals Addressed: Patient will: 1. Reduce symptoms of: depressed mood, feeling overwhelmed, stress, trouble sleeping, limited support  2. Increase knowledge and/or ability of: coping skills and healthy habits   3. Demonstrate ability to: self manage symptoms   Progress towards Goals: Ongoing   Interventions: Interventions utilized:  Supportive counseling  Standardized Assessments completed:  Flowsheet Row Initial Prenatal from 07/20/2020 in [facility]  PHQ-9 Total Score 9      Assessment: Patient currently experiencing depression and anxiety affecting pregnancy    Patient may benefit from integrated behavioral health   Plan: 1. Follow up with behavioral health clinician on : 08/09/2020 2. Behavioral recommendations: family counseling, discuss parenting plan with fob, keep all scheduled appts,  3. Referral(s):  4. "From scale of  1-10, how likely are you to follow plan?":  Gwyndolyn Saxon, LCSW

## 2020-07-22 ENCOUNTER — Other Ambulatory Visit (HOSPITAL_COMMUNITY): Payer: Self-pay

## 2020-07-22 LAB — URINE CULTURE, OB REFLEX

## 2020-07-22 LAB — CULTURE, OB URINE

## 2020-07-23 LAB — CYTOLOGY - PAP
Comment: NEGATIVE
Diagnosis: NEGATIVE
Diagnosis: REACTIVE
High risk HPV: POSITIVE — AB

## 2020-07-27 ENCOUNTER — Other Ambulatory Visit (HOSPITAL_COMMUNITY): Payer: Self-pay

## 2020-07-27 ENCOUNTER — Encounter: Payer: Self-pay | Admitting: Obstetrics and Gynecology

## 2020-08-02 ENCOUNTER — Inpatient Hospital Stay (HOSPITAL_COMMUNITY)
Admission: AD | Admit: 2020-08-02 | Discharge: 2020-08-03 | Discharge status: Against Medical Advice | Payer: Medicaid Other | Attending: Family Medicine | Admitting: Family Medicine

## 2020-08-02 DIAGNOSIS — O219 Vomiting of pregnancy, unspecified: Secondary | ICD-10-CM | POA: Insufficient documentation

## 2020-08-02 DIAGNOSIS — Z87891 Personal history of nicotine dependence: Secondary | ICD-10-CM | POA: Diagnosis not present

## 2020-08-02 DIAGNOSIS — Z3A12 12 weeks gestation of pregnancy: Secondary | ICD-10-CM | POA: Insufficient documentation

## 2020-08-02 DIAGNOSIS — O10911 Unspecified pre-existing hypertension complicating pregnancy, first trimester: Secondary | ICD-10-CM | POA: Insufficient documentation

## 2020-08-02 DIAGNOSIS — O0990 Supervision of high risk pregnancy, unspecified, unspecified trimester: Secondary | ICD-10-CM

## 2020-08-02 DIAGNOSIS — Z7982 Long term (current) use of aspirin: Secondary | ICD-10-CM | POA: Diagnosis not present

## 2020-08-02 DIAGNOSIS — Z7984 Long term (current) use of oral hypoglycemic drugs: Secondary | ICD-10-CM | POA: Diagnosis not present

## 2020-08-02 DIAGNOSIS — O09511 Supervision of elderly primigravida, first trimester: Secondary | ICD-10-CM | POA: Insufficient documentation

## 2020-08-02 DIAGNOSIS — Z79899 Other long term (current) drug therapy: Secondary | ICD-10-CM | POA: Diagnosis not present

## 2020-08-02 DIAGNOSIS — O10919 Unspecified pre-existing hypertension complicating pregnancy, unspecified trimester: Secondary | ICD-10-CM

## 2020-08-02 LAB — COMPREHENSIVE METABOLIC PANEL
ALT: 12 U/L (ref 0–44)
AST: 17 U/L (ref 15–41)
Albumin: 3.7 g/dL (ref 3.5–5.0)
Alkaline Phosphatase: 52 U/L (ref 38–126)
Anion gap: 8 (ref 5–15)
BUN: 8 mg/dL (ref 6–20)
CO2: 25 mmol/L (ref 22–32)
Calcium: 9.1 mg/dL (ref 8.9–10.3)
Chloride: 101 mmol/L (ref 98–111)
Creatinine, Ser: 0.7 mg/dL (ref 0.44–1.00)
GFR, Estimated: >60 mL/min (ref >60)
Glucose, Bld: 89 mg/dL (ref 70–99)
Potassium: 3 mmol/L — ABNORMAL LOW (ref 3.5–5.1)
Sodium: 134 mmol/L — ABNORMAL LOW (ref 135–145)
Total Bilirubin: 0.4 mg/dL (ref 0.3–1.2)
Total Protein: 7.1 g/dL (ref 6.5–8.1)

## 2020-08-02 LAB — URINALYSIS, ROUTINE W REFLEX MICROSCOPIC
Bilirubin Urine: NEGATIVE
Glucose, UA: NEGATIVE mg/dL
Hgb urine dipstick: NEGATIVE
Ketones, ur: 20 mg/dL — AB
Leukocytes,Ua: NEGATIVE
Nitrite: NEGATIVE
Protein, ur: NEGATIVE mg/dL
Specific Gravity, Urine: 1.013 (ref 1.005–1.030)
pH: 6 (ref 5.0–8.0)

## 2020-08-02 MED ORDER — LACTATED RINGERS IV BOLUS: 1000.0000 mL | Freq: Once | INTRAVENOUS | Status: DC

## 2020-08-02 MED ORDER — ONDANSETRON HCL 4 MG/2ML IJ SOLN: 4.0000 mg | Freq: Once | INTRAMUSCULAR | Status: DC

## 2020-08-02 NOTE — MAU Note (Signed)
PT SAYS HAS BEEN VOMITED - HAS TAKEN HER MEDS -  TOOK DECLEGIS- AT 5PM. TOOK PHENERGAN- 5PM. VOMITED AT 730.

## 2020-08-03 ENCOUNTER — Inpatient Hospital Stay (HOSPITAL_COMMUNITY)
Admission: AD | Admit: 2020-08-03 | Discharge: 2020-08-03 | Disposition: A | Discharge status: Home / Self Care | Payer: Medicaid Other | Source: Home / Self Care | Attending: Obstetrics and Gynecology | Admitting: Obstetrics and Gynecology

## 2020-08-03 DIAGNOSIS — O09511 Supervision of elderly primigravida, first trimester: Secondary | ICD-10-CM | POA: Insufficient documentation

## 2020-08-03 DIAGNOSIS — Z7982 Long term (current) use of aspirin: Secondary | ICD-10-CM | POA: Insufficient documentation

## 2020-08-03 DIAGNOSIS — Z87891 Personal history of nicotine dependence: Secondary | ICD-10-CM | POA: Insufficient documentation

## 2020-08-03 DIAGNOSIS — O219 Vomiting of pregnancy, unspecified: Secondary | ICD-10-CM | POA: Insufficient documentation

## 2020-08-03 DIAGNOSIS — Z7984 Long term (current) use of oral hypoglycemic drugs: Secondary | ICD-10-CM | POA: Insufficient documentation

## 2020-08-03 DIAGNOSIS — O0990 Supervision of high risk pregnancy, unspecified, unspecified trimester: Secondary | ICD-10-CM

## 2020-08-03 DIAGNOSIS — Z79899 Other long term (current) drug therapy: Secondary | ICD-10-CM | POA: Insufficient documentation

## 2020-08-03 DIAGNOSIS — O10911 Unspecified pre-existing hypertension complicating pregnancy, first trimester: Secondary | ICD-10-CM | POA: Insufficient documentation

## 2020-08-03 DIAGNOSIS — O210 Mild hyperemesis gravidarum: Secondary | ICD-10-CM

## 2020-08-03 DIAGNOSIS — O10919 Unspecified pre-existing hypertension complicating pregnancy, unspecified trimester: Secondary | ICD-10-CM

## 2020-08-03 DIAGNOSIS — Z3A12 12 weeks gestation of pregnancy: Secondary | ICD-10-CM | POA: Insufficient documentation

## 2020-08-03 LAB — COMPREHENSIVE METABOLIC PANEL
ALT: 14 U/L (ref 0–44)
AST: 16 U/L (ref 15–41)
Albumin: 3.6 g/dL (ref 3.5–5.0)
Alkaline Phosphatase: 47 U/L (ref 38–126)
Anion gap: 9 (ref 5–15)
BUN: 7 mg/dL (ref 6–20)
CO2: 23 mmol/L (ref 22–32)
Calcium: 8.7 mg/dL — ABNORMAL LOW (ref 8.9–10.3)
Chloride: 104 mmol/L (ref 98–111)
Creatinine, Ser: 0.69 mg/dL (ref 0.44–1.00)
GFR, Estimated: >60 mL/min (ref >60)
Glucose, Bld: 83 mg/dL (ref 70–99)
Potassium: 3.2 mmol/L — ABNORMAL LOW (ref 3.5–5.1)
Sodium: 136 mmol/L (ref 135–145)
Total Bilirubin: 0.5 mg/dL (ref 0.3–1.2)
Total Protein: 6.9 g/dL (ref 6.5–8.1)

## 2020-08-03 LAB — URINALYSIS, ROUTINE W REFLEX MICROSCOPIC
Bilirubin Urine: NEGATIVE
Glucose, UA: NEGATIVE mg/dL
Hgb urine dipstick: NEGATIVE
Ketones, ur: 20 mg/dL — AB
Leukocytes,Ua: NEGATIVE
Nitrite: NEGATIVE
Protein, ur: NEGATIVE mg/dL
Specific Gravity, Urine: 1.017 (ref 1.005–1.030)
pH: 6 (ref 5.0–8.0)

## 2020-08-03 LAB — CBC
HCT: 36.9 % (ref 36.0–46.0)
Hemoglobin: 12.4 g/dL (ref 12.0–15.0)
MCH: 25.8 pg — ABNORMAL LOW (ref 26.0–34.0)
MCHC: 33.6 g/dL (ref 30.0–36.0)
MCV: 76.9 fL — ABNORMAL LOW (ref 80.0–100.0)
Platelets: 377 10*3/uL (ref 150–400)
RBC: 4.8 MIL/uL (ref 3.87–5.11)
RDW: 17.2 % — ABNORMAL HIGH (ref 11.5–15.5)
WBC: 12.5 10*3/uL — ABNORMAL HIGH (ref 4.0–10.5)
nRBC: 0 % (ref 0.0–0.2)

## 2020-08-03 MED ORDER — SCOPOLAMINE 1 MG/3DAYS TD PT72
1.0000 | MEDICATED_PATCH | TRANSDERMAL | 12 refills | Status: DC
  Filled 2020-08-03 – 2020-08-04 (×2): qty 4, 12d supply, fill #0

## 2020-08-03 MED ORDER — SCOPOLAMINE 1 MG/3DAYS TD PT72
1.0000 | MEDICATED_PATCH | TRANSDERMAL | Status: DC
  Administered 2020-08-03: 1.5 mg via TRANSDERMAL
  Filled 2020-08-03: qty 1

## 2020-08-03 MED ORDER — M.V.I. ADULT IV INJ
Freq: Once | INTRAVENOUS | Status: AC
  Filled 2020-08-03: qty 1000

## 2020-08-03 MED ORDER — LACTATED RINGERS IV BOLUS
1000.0000 mL | Freq: Once | INTRAVENOUS | Status: AC
  Administered 2020-08-03: 1000 mL via INTRAVENOUS

## 2020-08-03 NOTE — MAU Note (Signed)
Pt told secretary she was going home did not want to wait anymore and sis not wait to sign AMA form.

## 2020-08-03 NOTE — MAU Note (Signed)
"  dehydration, nausea and vomiting".  Has thrown up 4 times today.  Taking phenergan and diclegis, last taken around 1540- took both then. Has been having diarrhea last 3 days, says this is not a new problem for her.

## 2020-08-03 NOTE — MAU Provider Note (Signed)
History     CSN: [id number]  Arrival date and time: 08/03/20 1634   Event Date/Time   First Provider Initiated Contact with Patient 08/03/20 2002      Chief Complaint  Patient presents with  . Emesis  . Nausea   HPI  Natalie Chase 40 y.o. [redacted]w[redacted]d Came to MAU yesterday but did not stay as she had to wait for awhile.  Comes back today and said Dr. Jolayne Panther wanted her to be hydrated.  Is taking Diclegis and PHenergan but vomited 3 times today.  Is able to keep down her medications, but has not eaten solid food in 4 days.  Is worried she is losing weight.  OB History    Gravida  4   Para  2   Term  2   Preterm      AB  1   Living  2     SAB      IAB  1   Ectopic      Multiple  0   Live Births  2           Past Medical History:  Diagnosis Date  . Asthma   . Hypertension   . Lupus (systemic lupus erythematosus) (HCC)   . Pre-diabetes 05/2020  . Sickle cell trait (HCC)   . Vaginal Pap smear, abnormal     Past Surgical History:  Procedure Laterality Date  . CHOLECYSTECTOMY      Family History  Problem Relation Age of Onset  . Achondroplasia Mother   . Lupus Mother   . Sickle cell anemia Mother   . Hypertension Father   . Stroke Father   . Cancer Other   . Heart disease Maternal Grandmother     Social History   Tobacco Use  . Smoking status: Former Smoker    Packs/day: 0.00    Types: Cigarettes    Quit date: 01/09/2017    Years since quitting: 3.5  . Smokeless tobacco: Former Clinical biochemist  . Vaping Use: Never used  Substance Use Topics  . Alcohol use: Not Currently  . Drug use: Not Currently    Types: Marijuana    Comment: last used last week    Allergies: No Known Allergies  Medications Prior to Admission  Medication Sig Dispense Refill Last Dose  . albuterol (VENTOLIN HFA) 108 (90 Base) MCG/ACT inhaler Inhale 1-2 puffs into the lungs every 6 (six) hours as needed for wheezing or shortness of breath. 18 g 1   . aspirin 81 MG  EC tablet Take 1 tablet (81 mg total) by mouth daily. Take after 12 weeks for prevention of preeclampsia later in pregnancy. 300 tablet 2   . cetirizine (ZYRTEC) 10 MG tablet TAKE 1 TABLET (10 MG TOTAL) BY MOUTH DAILY. (Patient not taking: No sig reported) 30 tablet 0   . Diethylpropion HCl CR 75 MG TB24 Take 1 tablet by mouth daily as directed (Patient not taking: Reported on 07/20/2020) 30 tablet 0   . Doxylamine-Pyridoxine (DICLEGIS) 10-10 MG TBEC Take 1-2 tablets by mouth at bedtime. Diclegis Instructions 1.  Take 2 tablets at bedtime. 2.  If symptoms persist, add another tablet in the morning. 3.  If symptoms persist, add another tablet in the afternoon.  You may take up to 4 tablets/day (Patient not taking: Reported on 07/20/2020) 60 tablet 1   . Doxylamine-Pyridoxine (DICLEGIS) 10-10 MG TBEC Take 2 tablets by mouth at bedtime. If symptoms persist, add one tablet in the morning  and one in the afternoon 100 tablet 5   . ferrous sulfate 325 (65 FE) MG tablet Take 1 tablet by mouth daily (Patient not taking: Reported on 07/20/2020) 90 tablet 1   . metFORMIN (GLUCOPHAGE) 500 MG tablet Take 1 (one) Tablet by mouth in evening (Patient not taking: No sig reported) 30 tablet 3   . NIFEdipine (PROCARDIA-XL/NIFEDICAL-XL) 30 MG 24 hr tablet Take 1 tablet (30 mg total) by mouth daily. Can increase to twice a day as needed for symptomatic contractions 30 tablet 2   . NIFEdipine (PROCARDIA-XL/NIFEDICAL-XL) 30 MG 24 hr tablet Take 1 tablet (30 mg total) by mouth 2 (two) times daily. Can increase to twice a day as needed for symptomatic contractions 60 tablet 3   . potassium chloride (KLOR-CON) 10 MEQ tablet Take 1 tablet (10 mEq total) by mouth daily. 5 tablet 0   . Prenatal Vit-Fe Fumarate-FA (NIVA-PLUS) 27-1 MG TABS Take 1 tablet by mouth daily. (Patient not taking: Reported on 07/20/2020) 30 tablet 12   . promethazine (PHENERGAN) 25 MG tablet Take 1 tablet (25 mg total) by mouth every 6 (six) hours as needed for  nausea or vomiting. 30 tablet 2   . sertraline (ZOLOFT) 50 MG tablet Take 1 tablet (50 mg total) by mouth daily. (Patient not taking: No sig reported) 30 tablet 3   . sertraline (ZOLOFT) 50 MG tablet Take 1 tablet (50 mg total) by mouth daily. 30 tablet 6     Review of Systems  Constitutional: Negative for fever.  Gastrointestinal: Positive for diarrhea, nausea and vomiting.  Genitourinary: Negative for dysuria, vaginal bleeding and vaginal discharge.   Physical Exam   Blood pressure (!) 153/90, pulse 83, temperature (!) 83 F (28.3 C), temperature source Oral, resp. rate 18, height 5' 4.5" (1.638 m), weight 84.4 kg, last menstrual period 05/08/2020, SpO2 99 %, not currently breastfeeding.  Physical Exam Vitals and nursing note reviewed.  Constitutional:      Appearance: She is well-developed.  HENT:     Head: Normocephalic.  Cardiovascular:     Rate and Rhythm: Normal rate.  Pulmonary:     Effort: Pulmonary effort is normal.     Breath sounds: Normal breath sounds.  Abdominal:     Palpations: Abdomen is soft.     Tenderness: There is no abdominal tenderness. There is no guarding or rebound.  Musculoskeletal:        General: Normal range of motion.     Cervical back: Neck supple.  Skin:    General: Skin is warm and dry.  Neurological:     Mental Status: She is alert and oriented to person, place, and time.     MAU Course  Procedures LABS Results for orders placed or performed during the hospital encounter of 08/03/20 (from the past 24 hour(s))  Urinalysis, Routine w reflex microscopic Urine, Clean Catch     Status: Abnormal   Collection Time: 08/03/20  7:10 PM  Result Value Ref Range   Color, Urine YELLOW YELLOW   APPearance HAZY (A) CLEAR   Specific Gravity, Urine 1.017 1.005 - 1.030   pH 6.0 5.0 - 8.0   Glucose, UA NEGATIVE NEGATIVE mg/dL   Hgb urine dipstick NEGATIVE NEGATIVE   Bilirubin Urine NEGATIVE NEGATIVE   Ketones, ur 20 (A) NEGATIVE mg/dL   Protein, ur  NEGATIVE NEGATIVE mg/dL   Nitrite NEGATIVE NEGATIVE   Leukocytes,Ua NEGATIVE NEGATIVE  CBC     Status: Abnormal   Collection Time: 08/03/20  8:25 PM  Result Value Ref Range   WBC 12.5 (H) 4.0 - 10.5 K/uL   RBC 4.80 3.87 - 5.11 MIL/uL   Hemoglobin 12.4 12.0 - 15.0 g/dL   HCT 87.5 64.3 - 32.9 %   MCV 76.9 (L) 80.0 - 100.0 fL   MCH 25.8 (L) 26.0 - 34.0 pg   MCHC 33.6 30.0 - 36.0 g/dL   RDW 51.8 (H) 84.1 - 66.0 %   Platelets 377 150 - 400 K/uL   nRBC 0.0 0.0 - 0.2 %     MDM To get 1000 cc LR as bolus and one bag of multivitamins Currently taking Phenergan and Diclegis.  Sipping on ginger ale.  Has vomited 3 times today.  Took Phenergan 1 hour before provider saw her and has not had any vomiting since she took the Phenergan.  Will not give Phenergan in IV fluids now.  Awaiting urinalysis. Discussed that spitting and vomiting are 2 different problems in pregnancy and stopping vomiting may not do anything to stop the spitting. Tried crackers in MAU and vomited.  Will add scopolamine patch to see if it will help her. Reports she had marijuana 2 weeks ago but none since.  Advised no marijuana as it restarts the vomiting. Advised that she only has a very mild dehydration with SG of 1.017 and ketones are 20 - the same as yesterday. Advised to continue liquids at home and try popsicle, fruit smoothie, milk shake or ensure.  Try these before she tries solid foods.  Advised no fried foods or oils. Advised some weight loss is expected in many women in early pregnancy.  Is not tachycardic. Weight 88.5 kg in the office on 07-20-20; Weight today 84.4 - two weeks later.  Assessment and Plan  Chronic hypertension in pregnancy, [redacted]w[redacted]d Nausea and vomiting in pregnancy  Plan In prenatal care. Noted BP high tonight.  Will recheck as she is getting fluids.  Is taking Procarida XL, as well as her Phenergan, Diclegis, low dose aspirin and PNV and keeping all down. Will add scopolamine patch. Advised to call  the office and be seen on Thursday or Friday if she is not improving this week.   Saveah Bahar L Ruel Dimmick 08/03/2020, 8:12 PM

## 2020-08-03 NOTE — Discharge Instructions (Signed)
Hyperemesis Gravidarum Hyperemesis gravidarum is a severe form of nausea and vomiting that happens during pregnancy. Hyperemesis is worse than morning sickness. It may cause you to have nausea or vomiting all day for many days. It may keep you from eating and drinking enough food and liquids, which can lead to dehydration, malnutrition, and weight loss. Hyperemesis usually occurs during the first half (the first 20 weeks) of pregnancy. It often goes away once a woman is in her second half of pregnancy. However, sometimes hyperemesis continues through an entire pregnancy. What are the causes? The cause of this condition is not known. It may be associated with:  Changes in hormones in the body during pregnancy.  Changes in the gastrointestinal system.  Genetic or inherited conditions. What are the signs or symptoms? Symptoms of this condition include:  Severe nausea and vomiting that does not go away.  Problems keeping food down.  Weight loss.  Loss of body fluid (dehydration).  Loss of appetite. You may have no desire to eat or you may not like the food you have previously enjoyed. How is this diagnosed? This condition may be diagnosed based on your medical history, your symptoms, and a physical exam. You may also have other tests, including:  Blood tests.  Urine tests.  Blood pressure tests.  Ultrasound to look for problems with the placenta or to check if you are pregnant with more than one baby. How is this treated? This condition is managed by controlling symptoms. This may include:  Following an eating plan. This can help to lessen nausea and vomiting.  Treatments that do not use medicine. These include acupressure bracelets, hypnosis, and eating or drinking foods or fluids that contain ginger, ginger ale, or ginger tea.  Taking prescription medicine or over-the-counter medicine as told by your health care provider.  Continuing to take prenatal vitamins. You may need  to change what kind you take and when you take them. Follow your health care provider's instructions about prenatal vitamins. An eating plan and medicines are often used together to help control symptoms. If medicines do not help relieve nausea and vomiting, you may need to receive fluids through an IV at the hospital. Follow these instructions at home: To help relieve your symptoms, listen to your body. Everyone is different and has different preferences. Find what works best for you. Here are some things you can try to help relieve your symptoms: Meals and snacks  Eat 5-6 small meals daily instead of 3 large meals. Eating small meals and snacks can help you avoid an empty stomach.  Before getting out of bed, eat a couple of crackers to avoid moving around on an empty stomach.  Eat a protein-rich snack before bed. Examples include cheese and crackers, or a peanut butter sandwich made with 1 slice of whole-wheat bread and 1 tsp (5 g) of peanut butter.  Eat and drink slowly.  Try eating starchy foods as these are usually tolerated well. Examples include cereal, toast, bread, potatoes, pasta, rice, and pretzels.  Eat at least one serving of protein with your meals and snacks. Protein options include lean meats, poultry, seafood, beans, nuts, nut butters, eggs, cheese, and yogurt.  Eat or suck on things that have ginger in them. It may help to relieve nausea. Add  tsp (0.44 g) ground ginger to hot tea, or choose ginger tea.   Fluids It is important to stay hydrated. Try to:  Drink small amounts of fluids often.  Drink fluids 30  minutes before or after a meal to help lessen the feeling of a full stomach.  Drink 100% fruit juice or an electrolyte drink. An electrolyte drink contains sodium, potassium, and chloride.  Drink fluids that are cold, clear, and carbonated or sour. These include lemonade, ginger ale, lemon-lime soda, ice water, and sparkling water. Things to avoid Avoid the  following:  Eating foods that trigger your symptoms. These may include spicy foods, coffee, high-fat foods, very sweet foods, and acidic foods.  Drinking more than 1 cup of fluid at a time.  Skipping meals. Nausea can be more intense on an empty stomach. If you cannot tolerate food, do not force it. Try sucking on ice chips or other frozen items and make up for missed calories later.  Lying down within 2 hours after eating.  Being exposed to environmental triggers. These may include food smells, smoky rooms, closed spaces, rooms with strong smells, warm or humid places, overly loud and noisy rooms, and rooms with motion or flickering lights. Try eating meals in a well-ventilated area that is free of strong smells.  Making quick and sudden changes in your movement.  Taking iron pills and multivitamins that contain iron. If you take prescription iron pills, do not stop taking them unless your health care provider approves.  Preparing food. The smell of food can spoil your appetite or trigger nausea. General instructions  Brush your teeth or use a mouth rinse after meals.  Take over-the-counter and prescription medicines only as told by your health care provider.  Follow instructions from your health care provider about eating or drinking restrictions.  Talk with your health care provider about starting a supplement of vitamin B6.  Continue to take your prenatal vitamins as told by your health care provider. If you are having trouble taking your prenatal vitamins, talk with your health care provider about other options.  Keep all follow-up visits. This is important. Follow-up visits include prenatal visits. Contact a health care provider if:  You have pain in your abdomen.  You have a severe headache.  You have vision problems.  You are losing weight.  You feel weak or dizzy.  You cannot eat or drink without vomiting, especially if this goes on for a full day. Get help right  away if:  You cannot drink fluids without vomiting.  You vomit blood.  You have constant nausea and vomiting.  You are very weak.  You faint.  You have a fever and your symptoms suddenly get worse. Summary  Hyperemesis gravidarum is a severe form of nausea and vomiting that happens during pregnancy.  Making some changes to your eating habits may help relieve nausea and vomiting.  This condition may be managed with lifestyle changes and medicines as prescribed by your health care provider.  If medicines do not help relieve nausea and vomiting, you may need to receive fluids through an IV at the hospital. This information is not intended to replace advice given to you by your health care provider. Make sure you discuss any questions you have with your health care provider. Document Revised: 09/08/2019 Document Reviewed: 09/08/2019 Elsevier Patient Education  2021 [employer]. Call the office this week and be seen if you are not improving with the fluids and the patch we added today.

## 2020-08-04 ENCOUNTER — Other Ambulatory Visit (HOSPITAL_COMMUNITY): Payer: Self-pay

## 2020-08-05 ENCOUNTER — Other Ambulatory Visit: Payer: Self-pay | Admitting: Obstetrics and Gynecology

## 2020-08-05 ENCOUNTER — Other Ambulatory Visit (HOSPITAL_COMMUNITY): Payer: Self-pay

## 2020-08-05 MED ORDER — PROMETHAZINE HCL 25 MG RE SUPP
25.0000 mg | Freq: Four times a day (QID) | RECTAL | 0 refills | Status: DC | PRN
  Filled 2020-08-05: qty 12, 3d supply, fill #0

## 2020-08-06 ENCOUNTER — Other Ambulatory Visit (HOSPITAL_COMMUNITY): Payer: Self-pay

## 2020-08-09 ENCOUNTER — Other Ambulatory Visit (HOSPITAL_COMMUNITY): Payer: Self-pay

## 2020-08-09 ENCOUNTER — Ambulatory Visit (INDEPENDENT_AMBULATORY_CARE_PROVIDER_SITE_OTHER): Payer: Medicaid Other | Admitting: Licensed Clinical Social Worker

## 2020-08-09 DIAGNOSIS — Z3A00 Weeks of gestation of pregnancy not specified: Secondary | ICD-10-CM | POA: Diagnosis not present

## 2020-08-09 DIAGNOSIS — O99340 Other mental disorders complicating pregnancy, unspecified trimester: Secondary | ICD-10-CM

## 2020-08-09 DIAGNOSIS — F32A Depression, unspecified: Secondary | ICD-10-CM

## 2020-08-11 NOTE — BH Specialist Note (Signed)
Integrated Behavioral Health via Telemedicine Visit  08/11/2020 Natalie Chase [id number]  Number of Integrated Behavioral Health visits: 2/6 Session Start time: 3:00pm  Session End time: 3:25pm Total time: 25 mins via mychart  Referring Provider: self referral Patient/Family location: home [facility] Provider location: [place] All persons participating in visit: Pt Natalie Chase and LCSWA A. Ryer Asato  Types of Service: Individual psychotherapy  I connected with Natalie Chase and/or Natalie Chase  via  Telephone or Video Enabled Telemedicine Application  (Video is Caregility application) and verified that I am speaking with the correct person using two identifiers. Discussed confidentiality: Yes   I discussed the limitations of telemedicine and the availability of in person appointments.  Discussed there is a possibility of technology failure and discussed alternative modes of communication if that failure occurs.  I discussed that engaging in this telemedicine visit, they consent to the provision of behavioral healthcare and the services will be billed under their insurance.  Patient and/or legal guardian expressed understanding and consented to Telemedicine visit: Yes   Presenting Concerns: Patient and/or family reports the following symptoms/concerns: stress, limited social and family support Duration of problem: over 2 years ; Severity of problem: moderate  Patient and/or Family'Natalie Strengths/Protective Factors: Caregiver has knowledge of parenting & child development  Goals Addressed: Patient will:  Reduce symptoms of: anxiety, depression, and stress   Increase knowledge and/or ability of: coping skills, self-management skills, and stress reduction   Demonstrate ability to: Increase adequate support systems for patient/family  Progress towards Goals: Ongoing  Interventions: Interventions utilized:  Motivational Interviewing and Supportive Counseling Standardized Assessments  completed: PHQ 9   Assessment: Patient currently experiencing depression affecting pregnancy.   Patient may benefit from integrated behavioral health.  Plan: Follow up with behavioral health clinician on : 08/17/2020 Behavioral recommendations: engage in stress reducing activity, keep scheduled appt, stressed importance of self care Referral(Natalie): n/a  I discussed the assessment and treatment plan with the patient and/or parent/guardian. They were provided an opportunity to ask questions and all were answered. They agreed with the plan and demonstrated an understanding of the instructions.   They were advised to call back or seek an in-person evaluation if the symptoms worsen or if the condition fails to improve as anticipated.  Gwyndolyn Saxon, LCSW

## 2020-08-16 ENCOUNTER — Other Ambulatory Visit (HOSPITAL_COMMUNITY): Payer: Self-pay

## 2020-08-17 ENCOUNTER — Other Ambulatory Visit: Payer: Self-pay

## 2020-08-17 ENCOUNTER — Ambulatory Visit (INDEPENDENT_AMBULATORY_CARE_PROVIDER_SITE_OTHER): Payer: Medicaid Other | Admitting: Obstetrics and Gynecology

## 2020-08-17 ENCOUNTER — Encounter: Payer: Self-pay | Admitting: Obstetrics and Gynecology

## 2020-08-17 ENCOUNTER — Ambulatory Visit: Payer: Medicaid Other | Admitting: Licensed Clinical Social Worker

## 2020-08-17 ENCOUNTER — Other Ambulatory Visit (HOSPITAL_COMMUNITY): Payer: Self-pay

## 2020-08-17 VITALS — BP 136/85 | HR 91 | Wt 184.2 lb

## 2020-08-17 DIAGNOSIS — D573 Sickle-cell trait: Secondary | ICD-10-CM

## 2020-08-17 DIAGNOSIS — O99019 Anemia complicating pregnancy, unspecified trimester: Secondary | ICD-10-CM

## 2020-08-17 DIAGNOSIS — M329 Systemic lupus erythematosus, unspecified: Secondary | ICD-10-CM

## 2020-08-17 DIAGNOSIS — O0990 Supervision of high risk pregnancy, unspecified, unspecified trimester: Secondary | ICD-10-CM

## 2020-08-17 DIAGNOSIS — O10919 Unspecified pre-existing hypertension complicating pregnancy, unspecified trimester: Secondary | ICD-10-CM

## 2020-08-17 MED ORDER — NIVA-PLUS 27-1 MG PO TABS
1.0000 | ORAL_TABLET | Freq: Every day | ORAL | 12 refills | Status: DC
  Filled 2020-08-17: qty 30, fill #0
  Filled 2020-08-20 – 2020-10-06 (×2): qty 30, 30d supply, fill #0
  Filled 2020-11-18: qty 30, 30d supply, fill #1
  Filled 2020-12-20 – 2020-12-28 (×2): qty 30, 30d supply, fill #2

## 2020-08-17 MED ORDER — NIFEDIPINE ER OSMOTIC RELEASE 30 MG PO TB24
30.0000 mg | ORAL_TABLET | Freq: Two times a day (BID) | ORAL | 11 refills | Status: DC
  Filled 2020-08-17: qty 60, 30d supply, fill #0
  Filled 2020-09-06 – 2020-09-09 (×3): qty 60, 30d supply, fill #1

## 2020-08-17 NOTE — Progress Notes (Signed)
Patient presents for ROB. Patient has no concerns today. 

## 2020-08-17 NOTE — Patient Instructions (Signed)

## 2020-08-17 NOTE — Progress Notes (Signed)
Subjective:  Natalie Chase is a 40 y.o. [phone] at [redacted]w[redacted]d being seen today for ongoing prenatal care.  She is currently monitored for the following issues for this high-risk pregnancy and has Hypertension; Chronic hypertension affecting pregnancy; Sickle cell trait in mother affecting pregnancy (HCC); Lupus (systemic lupus erythematosus) (HCC); and Supervision of high risk pregnancy, antepartum on their problem list.  Patient reports no complaints.  Contractions: Not present. Vag. Bleeding: None.   . Denies leaking of fluid.   The following portions of the patient's history were reviewed and updated as appropriate: allergies, current medications, past family history, past medical history, past social history, past surgical history and problem list. Problem list updated.  Objective:   Vitals:   08/17/20 1452  BP: 136/85  Pulse: 91  Weight: 184 lb 3.2 oz (83.6 kg)    Fetal Status: Fetal Heart Rate (bpm): 164         General:  Alert, oriented and cooperative. Patient is in no acute distress.  Skin: Skin is warm and dry. No rash noted.   Cardiovascular: Normal heart rate noted  Respiratory: Normal respiratory effort, no problems with respiration noted  Abdomen: Soft, gravid, appropriate for gestational age. Pain/Pressure: Absent     Pelvic:  Cervical exam deferred        Extremities: Normal range of motion.  Edema: None  Mental Status: Normal mood and affect. Normal behavior. Normal judgment and thought content.   Urinalysis:      Assessment and Plan:  Pregnancy: [id number] at [redacted]w[redacted]d  1. Supervision of high risk pregnancy, antepartum Stable N/V much improved. Wean off antiemetic medications - Prenatal Vit-Fe Fumarate-FA (NIVA-PLUS) 27-1 MG TABS; Take 1 tablet by mouth daily.  Dispense: 30 tablet; Refill: 12 Anatomy scan next month  2. Chronic hypertension affecting pregnancy BP stable Continue with procardia Anatomy scan next month Serial growth scans Antenatal testing starting at  32 weeks  - NIFEdipine (PROCARDIA-XL/NIFEDICAL-XL) 30 MG 24 hr tablet; Take 1 tablet (30 mg total) by mouth 2 (two) times daily. Can increase to twice a day as needed for symptomatic contractions  Dispense: 60 tablet; Refill: 11  3. Systemic lupus erythematosus, unspecified SLE type, unspecified organ involvement status (HCC) Was on Plaquenil with last pregnancy. Last saw Rheumatology 06/2019 Instructed no call for follow up appt    4. Sickle cell trait in mother affecting pregnancy (HCC) Stable  Preterm labor symptoms and general obstetric precautions including but not limited to vaginal bleeding, contractions, leaking of fluid and fetal movement were reviewed in detail with the patient. Please refer to After Visit Summary for other counseling recommendations.  Return in about 4 weeks (around 09/14/2020) for OB visit, face to face, MD only.   Hermina Staggers, MD

## 2020-08-20 ENCOUNTER — Other Ambulatory Visit (HOSPITAL_COMMUNITY): Payer: Self-pay

## 2020-08-23 ENCOUNTER — Other Ambulatory Visit (HOSPITAL_COMMUNITY): Payer: Self-pay

## 2020-08-23 ENCOUNTER — Other Ambulatory Visit: Payer: Self-pay

## 2020-08-23 DIAGNOSIS — O0990 Supervision of high risk pregnancy, unspecified, unspecified trimester: Secondary | ICD-10-CM

## 2020-08-23 MED ORDER — NIVA-PLUS 27-1 MG PO TABS: 1.0000 | ORAL_TABLET | Freq: Every day | ORAL | 12 refills | Status: DC

## 2020-08-25 ENCOUNTER — Other Ambulatory Visit: Payer: Self-pay

## 2020-08-25 ENCOUNTER — Telehealth: Payer: Self-pay

## 2020-08-25 ENCOUNTER — Other Ambulatory Visit (HOSPITAL_COMMUNITY): Payer: Self-pay

## 2020-08-25 DIAGNOSIS — Z01419 Encounter for gynecological examination (general) (routine) without abnormal findings: Secondary | ICD-10-CM

## 2020-08-25 MED ORDER — NIVA-PLUS 27-1 MG PO TABS
1.0000 | ORAL_TABLET | Freq: Every day | ORAL | 12 refills | Status: DC
  Filled 2020-08-25: qty 30, fill #0

## 2020-08-25 NOTE — Progress Notes (Signed)
Rx sent to correct pharmacy w/ correct quantity.

## 2020-08-25 NOTE — Telephone Encounter (Signed)
Return call to pt regarding message of not receiving PNV's in last 2 wks.  Rx resent called pharmacy to confirm Pt made aware she can pick up Rx today.

## 2020-08-26 ENCOUNTER — Other Ambulatory Visit (HOSPITAL_COMMUNITY): Payer: Self-pay

## 2020-08-31 ENCOUNTER — Other Ambulatory Visit (HOSPITAL_COMMUNITY): Payer: Self-pay

## 2020-09-02 ENCOUNTER — Telehealth: Payer: Self-pay | Admitting: *Deleted

## 2020-09-02 NOTE — Telephone Encounter (Signed)
Pt called to office with lower back/hip/buttock pain. ?Sciatic pain Pt was advised to try Tylenol, heat pack, prenatal stretches and maternity support belt. Pt advised to try at home measures and if no change contact office for further recommendations.

## 2020-09-06 ENCOUNTER — Other Ambulatory Visit (HOSPITAL_COMMUNITY): Payer: Self-pay

## 2020-09-07 ENCOUNTER — Ambulatory Visit: Payer: Medicaid Other | Admitting: Licensed Clinical Social Worker

## 2020-09-07 ENCOUNTER — Encounter: Payer: Medicaid Other | Admitting: Licensed Clinical Social Worker

## 2020-09-09 ENCOUNTER — Other Ambulatory Visit (HOSPITAL_COMMUNITY): Payer: Self-pay

## 2020-09-15 ENCOUNTER — Ambulatory Visit (INDEPENDENT_AMBULATORY_CARE_PROVIDER_SITE_OTHER): Payer: Medicaid Other | Admitting: Family Medicine

## 2020-09-15 ENCOUNTER — Encounter: Payer: Self-pay | Admitting: Family Medicine

## 2020-09-15 ENCOUNTER — Other Ambulatory Visit (HOSPITAL_COMMUNITY): Payer: Self-pay

## 2020-09-15 ENCOUNTER — Other Ambulatory Visit: Payer: Self-pay

## 2020-09-15 ENCOUNTER — Encounter: Payer: Self-pay | Admitting: Obstetrics

## 2020-09-15 VITALS — BP 128/77 | HR 114 | Wt 182.8 lb

## 2020-09-15 DIAGNOSIS — O0990 Supervision of high risk pregnancy, unspecified, unspecified trimester: Secondary | ICD-10-CM

## 2020-09-15 DIAGNOSIS — M545 Low back pain, unspecified: Secondary | ICD-10-CM

## 2020-09-15 DIAGNOSIS — O10919 Unspecified pre-existing hypertension complicating pregnancy, unspecified trimester: Secondary | ICD-10-CM

## 2020-09-15 DIAGNOSIS — M329 Systemic lupus erythematosus, unspecified: Secondary | ICD-10-CM

## 2020-09-15 MED ORDER — CYCLOBENZAPRINE HCL 10 MG PO TABS
10.0000 mg | ORAL_TABLET | Freq: Three times a day (TID) | ORAL | 1 refills | Status: DC | PRN
  Filled 2020-09-15 – 2020-09-23 (×2): qty 30, 10d supply, fill #0

## 2020-09-15 MED ORDER — NIFEDIPINE ER 60 MG PO TB24
60.0000 mg | ORAL_TABLET | Freq: Every day | ORAL | 5 refills | Status: DC
  Filled 2020-09-15 – 2020-09-23 (×2): qty 30, 30d supply, fill #0
  Filled 2020-11-18: qty 30, 30d supply, fill #1
  Filled 2020-12-20 – 2020-12-28 (×2): qty 30, 30d supply, fill #2
  Filled 2021-02-03: qty 30, 30d supply, fill #3

## 2020-09-15 NOTE — Progress Notes (Signed)
   Subjective:  Natalie Chase is a 40 y.o. [phone] at [redacted]w[redacted]d being seen today for ongoing prenatal care.  She is currently monitored for the following issues for this high-risk pregnancy and has Hypertension; Chronic hypertension affecting pregnancy; Sickle cell trait in mother affecting pregnancy (HCC); Lupus (systemic lupus erythematosus) (HCC); and Supervision of high risk pregnancy, antepartum on their problem list.  Patient reports no complaints.  Contractions: Not present. Vag. Bleeding: None.   . Denies leaking of fluid.   The following portions of the patient's history were reviewed and updated as appropriate: allergies, current medications, past family history, past medical history, past social history, past surgical history and problem list. Problem list updated.  Objective:   Vitals:   09/15/20 1001  BP: 128/77  Pulse: (!) 114  Weight: 182 lb 12.8 oz (82.9 kg)    Fetal Status: Fetal Heart Rate (bpm): 152         General:  Alert, oriented and cooperative. Patient is in no acute distress.  Skin: Skin is warm and dry. No rash noted.   Cardiovascular: Normal heart rate noted  Respiratory: Normal respiratory effort, no problems with respiration noted  Abdomen: Soft, gravid, appropriate for gestational age. Pain/Pressure: Present     Pelvic: Vag. Bleeding: None     Cervical exam deferred        Extremities: Normal range of motion.  Edema: None  Mental Status: Normal mood and affect. Normal behavior. Normal judgment and thought content.   Urinalysis:      Assessment and Plan:  Pregnancy: [id number] at [redacted]w[redacted]d  1. Supervision of high risk pregnancy, antepartum BP and FHR normal Anatomy scan scheduled for next week  2. Chronic hypertension affecting pregnancy BP well controlled on Nifedipine 60 mg XL On ASA To follow w MFM  3. Systemic lupus erythematosus, unspecified SLE type, unspecified organ involvement status (HCC) Advised to follow up w Rheum last visit Reports she is  going to see them on August 12th Needs Ssa/Ssb Ab screen, lupus panel sent today Will also recheck TSH with T4  4. Low back pain Spasm of R lumbar paraspinals on exam, neg straight leg raise, do not suspect any element of sciatica Trial flexeril, refer to PT at [facility] given almost 4 week of symptoms  Preterm labor symptoms and general obstetric precautions including but not limited to vaginal bleeding, contractions, leaking of fluid and fetal movement were reviewed in detail with the patient. Please refer to After Visit Summary for other counseling recommendations.  Return in 4 weeks (on 10/13/2020) for [facility], ob visit, needs MD.   Venora Maples, MD

## 2020-09-15 NOTE — Progress Notes (Signed)
Pt unsure if she is feeling fetal movement yet. Reports pain that may be associated with sciatica, becoming difficult for daily activities.

## 2020-09-15 NOTE — Patient Instructions (Signed)

## 2020-09-20 ENCOUNTER — Encounter: Payer: Self-pay | Admitting: *Deleted

## 2020-09-20 ENCOUNTER — Ambulatory Visit: Payer: Medicaid Other | Attending: Obstetrics and Gynecology

## 2020-09-20 ENCOUNTER — Ambulatory Visit (HOSPITAL_BASED_OUTPATIENT_CLINIC_OR_DEPARTMENT_OTHER): Payer: Medicaid Other | Admitting: Obstetrics and Gynecology

## 2020-09-20 ENCOUNTER — Other Ambulatory Visit: Payer: Self-pay | Admitting: *Deleted

## 2020-09-20 ENCOUNTER — Ambulatory Visit: Payer: Medicaid Other | Admitting: *Deleted

## 2020-09-20 ENCOUNTER — Other Ambulatory Visit: Payer: Self-pay

## 2020-09-20 VITALS — BP 131/68 | HR 95

## 2020-09-20 DIAGNOSIS — O99212 Obesity complicating pregnancy, second trimester: Secondary | ICD-10-CM | POA: Diagnosis present

## 2020-09-20 DIAGNOSIS — D6862 Lupus anticoagulant syndrome: Secondary | ICD-10-CM | POA: Insufficient documentation

## 2020-09-20 DIAGNOSIS — O10012 Pre-existing essential hypertension complicating pregnancy, second trimester: Secondary | ICD-10-CM | POA: Insufficient documentation

## 2020-09-20 DIAGNOSIS — O09522 Supervision of elderly multigravida, second trimester: Secondary | ICD-10-CM

## 2020-09-20 DIAGNOSIS — D573 Sickle-cell trait: Secondary | ICD-10-CM | POA: Insufficient documentation

## 2020-09-20 DIAGNOSIS — O99019 Anemia complicating pregnancy, unspecified trimester: Secondary | ICD-10-CM

## 2020-09-20 DIAGNOSIS — O10919 Unspecified pre-existing hypertension complicating pregnancy, unspecified trimester: Secondary | ICD-10-CM | POA: Insufficient documentation

## 2020-09-20 DIAGNOSIS — Z3A20 20 weeks gestation of pregnancy: Secondary | ICD-10-CM | POA: Insufficient documentation

## 2020-09-20 DIAGNOSIS — O99112 Other diseases of the blood and blood-forming organs and certain disorders involving the immune mechanism complicating pregnancy, second trimester: Secondary | ICD-10-CM

## 2020-09-20 DIAGNOSIS — O0990 Supervision of high risk pregnancy, unspecified, unspecified trimester: Secondary | ICD-10-CM | POA: Insufficient documentation

## 2020-09-20 NOTE — Progress Notes (Signed)
Maternal-Fetal Medicine   Name: Natalie Chase DOB: 07/31/1980 MRN: [id number] Referring Provider: Jaynie Collins, MD  I had the pleasure of seeing [name] [facility]. She is G4 P2 at 19w 2d gestation and is here for fetal anatomy scan.  Her problems include: -Chronic hypertension.  Diagnosed about 7 years ago.  Patient takes Procardia XL 60 mg daily and her blood pressures are reportedly well controlled.  She takes low-dose aspirin prophylaxis.  She was taking hydrochlorothiazide before pregnancy. -Systemic lupus erythematosus (SLE) diagnosed about 4 months ago.  Patient takes Plaquenil and reports she does not have any flares.  She is being followed by a rheumatologist at [facility]. -Sickle cell trait.  Her partner also has sickle cell trait. -Advanced maternal age.  On cell free fetal DNA screening, the risks of fetal aneuploidies are not increased.  Patient does not have diabetes or thyroid disorder. Past surgical history: Laparoscopic cholecystectomy. Medications: Prenatal vitamins, Zoloft, nifedipine, and Flexeril, aspirin. Allergies: No known drug allergies. Social history: Denies tobacco or drug or alcohol use.  Her partner is African-American and he is in good health except for hypertension.  He is a father of her second child. Family history: Mother had sickle cell anemia and died at age 24.  Father had stroke and has gout.  No history of venous thromboembolism in the family.  Obstetric history -11/2002: Delivery of a female infant weighing 7 pounds 14 ounces at birth.  Her pregnancy and delivery were uncomplicated.  She -04/2019: Term delivery of a female infant weighing 8 pounds at birth.  She had lupus in her previous pregnancy.  No history of fetal growth restriction. She had 1 early termination of pregnancy without complications.  GYN history: She had abnormal Pap smears in the past but no cervical surgeries.  No history of  breast disease.  Her previous menstrual cycles were regular.  Ultrasound We performed fetal anatomy scan.  Amniotic fluid is normal and good fetal activity seen.  No markers of aneuploidies or fetal structural defects are seen.  Fetal biometry is consistent with the previously established dates. Fetal heart rate and rhythm appear normal. Patient understands the limitations of ultrasound in detecting fetal anomalies  Our concerns include Chronic hypertension -I discussed the importance of good blood pressure control to prevent adverse maternal and fetal outcomes.  Maternal complications include stroke, renal and coagulation complications.  Placental abruption is more common in hypertension. -Superimposed preeclampsia present 30% of the cases.  I discussed the benefit of low-dose aspirin in preventing preeclampsia. -I reassured her of safety of antihypertensive (nifedipine).  Blood pressure is not well controlled, labetalol may be added. -I discussed ultrasound protocol of serial fetal growth assessments and weekly BPP from [redacted] weeks gestation till delivery. -Provided her blood pressures are well controlled, she can be delivered at 39 weeks' gestation.  Early term delivery (37 or 38 weeks) should be considered if hypertension is not well controlled or she develops lupus flares.  SLE -In the absence of flares and with good control of hypertension, we would expect good pregnancy outcomes. -I reassured her of safety profile of Plaquenil in pregnancy. -I recommend screening for anti-SSA (Ro) and anti-SSB (La) antibodies.  I counseled her on the significance of these antibodies and the complication of congenital heart block that rarely occurs (1% to 2% in patients with increased antibodies). -Patient could not stay for to be drawn for antibiotics she will contact your office tomorrow and set  up a date.  Sickle cell trait -Since both patient and her partner have sickle cell trait, the risk of fetus  affected with sickle cell disease is 1 and 4 (25%).  I discussed prenatal diagnosis (amniocentesis).  I explained amniocentesis procedure and possible complication of miscarriage (1 in 500 procedures).  Amniocentesis was also given information on fetal karyotype and some genetic conditions.  Advanced maternal age is associated with increased risk of fetal chromosomal anomalies.  Patient will discuss with her husband and decide. I offered to make an appointment to meet our genetic counselor.  Recommendations -Blood to be drawn for anti-SSA (Ro) and anti-SSB antibodies (La). -An appointment was made for her to return in 4 weeks for completion of fetal anatomy. -Fetal growth assessments every 4 weeks. -Weekly BPP from 32 weeks' gestation till delivery. -Patient will decide on meeting with our genetic counselor or have amniocentesis (sickle-cell trait). -Continue nifedipine.   Thank you for consultation.  If you have any questions or concerns, please contact me the [facility].  Consultation including face-to-face (more than 50%) counseling 45 minutes.

## 2020-09-22 ENCOUNTER — Ambulatory Visit (INDEPENDENT_AMBULATORY_CARE_PROVIDER_SITE_OTHER): Payer: Medicaid Other

## 2020-09-22 ENCOUNTER — Other Ambulatory Visit: Payer: Self-pay

## 2020-09-22 ENCOUNTER — Other Ambulatory Visit (HOSPITAL_COMMUNITY)
Admission: RE | Admit: 2020-09-22 | Discharge: 2020-09-22 | Disposition: A | Discharge status: Home / Self Care | Payer: Medicaid Other | Source: Ambulatory Visit | Attending: Obstetrics and Gynecology | Admitting: Obstetrics and Gynecology

## 2020-09-22 VITALS — BP 147/79 | HR 93 | Wt 188.0 lb

## 2020-09-22 DIAGNOSIS — N898 Other specified noninflammatory disorders of vagina: Secondary | ICD-10-CM

## 2020-09-22 NOTE — Progress Notes (Signed)
SUBJECTIVE:  40 y.o. female complains of foul vaginal discharge for 2 week(s). Denies abnormal vaginal bleeding or significant pelvic pain or fever. No UTI symptoms. Denies history of known exposure to STD.  Patient's last menstrual period was 05/08/2020 (approximate).  OBJECTIVE:  She appears well, afebrile. Urine dipstick: not done.  ASSESSMENT:  Vaginal Discharge  Vaginal Odor   PLAN:  GC, chlamydia, trichomonas, BVAG, CVAG probe sent to lab. Treatment: To be determined once lab results are received ROV prn if symptoms persist or worsen.

## 2020-09-22 NOTE — Progress Notes (Signed)
Agree wit A & P

## 2020-09-23 ENCOUNTER — Other Ambulatory Visit (HOSPITAL_COMMUNITY): Payer: Self-pay

## 2020-09-24 LAB — CERVICOVAGINAL ANCILLARY ONLY
Bacterial Vaginitis (gardnerella): NEGATIVE
Candida Glabrata: NEGATIVE
Candida Vaginitis: NEGATIVE
Chlamydia: NEGATIVE
Comment: NEGATIVE
Comment: NEGATIVE
Comment: NEGATIVE
Comment: NEGATIVE
Comment: NORMAL
Neisseria Gonorrhea: NEGATIVE

## 2020-09-24 LAB — AFP, SERUM, OPEN SPINA BIFIDA
AFP MoM: 1.22
AFP Value: 54.1 ng/mL
Gest. Age on Collection Date: 18 weeks
Maternal Age At EDD: 38.5 yr
OSBR Risk 1 IN: 10000
Test Results:: NEGATIVE
Weight: 182 [lb_av]

## 2020-09-24 LAB — ANA+ENA+DNA/DS+SCL 70+SJOSSA/B
ANA Titer 1: NEGATIVE
ENA RNP Ab: 0.2 AI (ref 0.0–0.9)
ENA SM Ab Ser-aCnc: <0.2 AI (ref 0.0–0.9)
ENA SSA (RO) Ab: <0.2 AI (ref 0.0–0.9)
ENA SSB (LA) Ab: <0.2 AI (ref 0.0–0.9)
Scleroderma (Scl-70) (ENA) Antibody, IgG: <0.2 AI (ref 0.0–0.9)
dsDNA Ab: 1 IU/mL (ref 0–9)

## 2020-09-24 LAB — TSH RFX ON ABNORMAL TO FREE T4: TSH: 0.889 u[IU]/mL (ref 0.450–4.500)

## 2020-10-06 ENCOUNTER — Other Ambulatory Visit (HOSPITAL_COMMUNITY): Payer: Self-pay

## 2020-10-13 ENCOUNTER — Ambulatory Visit (INDEPENDENT_AMBULATORY_CARE_PROVIDER_SITE_OTHER): Payer: Medicaid Other | Admitting: Obstetrics and Gynecology

## 2020-10-13 ENCOUNTER — Other Ambulatory Visit (HOSPITAL_COMMUNITY): Payer: Self-pay

## 2020-10-13 ENCOUNTER — Other Ambulatory Visit: Payer: Self-pay

## 2020-10-13 VITALS — BP 110/70 | HR 94 | Wt 192.7 lb

## 2020-10-13 DIAGNOSIS — O10919 Unspecified pre-existing hypertension complicating pregnancy, unspecified trimester: Secondary | ICD-10-CM

## 2020-10-13 DIAGNOSIS — O09522 Supervision of elderly multigravida, second trimester: Secondary | ICD-10-CM

## 2020-10-13 DIAGNOSIS — O0990 Supervision of high risk pregnancy, unspecified, unspecified trimester: Secondary | ICD-10-CM

## 2020-10-13 DIAGNOSIS — O09523 Supervision of elderly multigravida, third trimester: Secondary | ICD-10-CM | POA: Insufficient documentation

## 2020-10-13 DIAGNOSIS — M329 Systemic lupus erythematosus, unspecified: Secondary | ICD-10-CM

## 2020-10-13 DIAGNOSIS — Z3A22 22 weeks gestation of pregnancy: Secondary | ICD-10-CM | POA: Insufficient documentation

## 2020-10-13 NOTE — Progress Notes (Signed)
ROB 22.[redacted]wks GA No complaints.

## 2020-10-13 NOTE — Progress Notes (Signed)
   PRENATAL VISIT NOTE  Subjective:  Natalie Chase is a 40 y.o. [id number] at [redacted]w[redacted]d being seen today for ongoing prenatal care.  She is currently monitored for the following issues for this high-risk pregnancy and has Hypertension; Chronic hypertension affecting pregnancy; Sickle cell trait in mother affecting pregnancy (HCC); Lupus (systemic lupus erythematosus) (HCC); Supervision of high risk pregnancy, antepartum; and [redacted] weeks gestation of pregnancy on their problem list.  Patient doing well with no acute concerns today. She reports no complaints.  Contractions: Not present. Vag. Bleeding: None.  Movement: Present. Denies leaking of fluid.   The following portions of the patient's history were reviewed and updated as appropriate: allergies, current medications, past family history, past medical history, past social history, past surgical history and problem list. Problem list updated.  Objective:   Vitals:   10/13/20 0831  BP: 110/70  Pulse: 94  Weight: 192 lb 11.2 oz (87.4 kg)    Fetal Status: Fetal Heart Rate (bpm): 154 Fundal Height: 22 cm Movement: Present     General:  Alert, oriented and cooperative. Patient is in no acute distress.  Skin: Skin is warm and dry. No rash noted.   Cardiovascular: Normal heart rate noted  Respiratory: Normal respiratory effort, no problems with respiration noted  Abdomen: Soft, gravid, appropriate for gestational age.  Pain/Pressure: Absent     Pelvic: Cervical exam deferred        Extremities: Normal range of motion.  Edema: Trace  Mental Status:  Normal mood and affect. Normal behavior. Normal judgment and thought content.   Assessment and Plan:  Pregnancy: [id number] at [redacted]w[redacted]d  1. Supervision of high risk pregnancy, antepartum Continue routine care, 2 hour GTT next visit  2. Chronic hypertension affecting pregnancy Excellent BP control with nifedipine  3. Systemic lupus erythematosus, unspecified SLE type, unspecified organ involvement status  (HCC) Labs reviewed, all WNL, per pt the disease is under good control/remission  4. [redacted] weeks gestation of pregnancy   Preterm labor symptoms and general obstetric precautions including but not limited to vaginal bleeding, contractions, leaking of fluid and fetal movement were reviewed in detail with the patient.  Please refer to After Visit Summary for other counseling recommendations.   Return in about 4 weeks (around 11/10/2020) for [facility], in person, 3rd trim labs, 2 hr GTT.   Mariel Aloe, MD Faculty Attending [facility]

## 2020-10-18 ENCOUNTER — Telehealth: Payer: Self-pay

## 2020-10-18 NOTE — Telephone Encounter (Signed)
Return call to pt regarding vm asking what to take for a cold while pregnant . Went over safe medication list with patient Pt voiced understanding.  Pt made aware any fever, N&V , body aches , chills to go to ER.

## 2020-10-19 ENCOUNTER — Other Ambulatory Visit: Payer: Self-pay | Admitting: Maternal & Fetal Medicine

## 2020-10-19 ENCOUNTER — Ambulatory Visit: Payer: Medicaid Other | Attending: Obstetrics and Gynecology

## 2020-10-19 ENCOUNTER — Encounter: Payer: Self-pay | Admitting: *Deleted

## 2020-10-19 ENCOUNTER — Ambulatory Visit: Payer: Self-pay

## 2020-10-19 ENCOUNTER — Ambulatory Visit: Payer: Medicaid Other | Admitting: *Deleted

## 2020-10-19 ENCOUNTER — Other Ambulatory Visit: Payer: Self-pay

## 2020-10-19 VITALS — BP 117/63 | HR 94

## 2020-10-19 DIAGNOSIS — D573 Sickle-cell trait: Secondary | ICD-10-CM | POA: Diagnosis not present

## 2020-10-19 DIAGNOSIS — O10919 Unspecified pre-existing hypertension complicating pregnancy, unspecified trimester: Secondary | ICD-10-CM

## 2020-10-19 DIAGNOSIS — O99322 Drug use complicating pregnancy, second trimester: Secondary | ICD-10-CM

## 2020-10-19 DIAGNOSIS — Z8349 Family history of other endocrine, nutritional and metabolic diseases: Secondary | ICD-10-CM

## 2020-10-19 DIAGNOSIS — F1290 Cannabis use, unspecified, uncomplicated: Secondary | ICD-10-CM

## 2020-10-19 DIAGNOSIS — O99212 Obesity complicating pregnancy, second trimester: Secondary | ICD-10-CM

## 2020-10-19 DIAGNOSIS — Z3A24 24 weeks gestation of pregnancy: Secondary | ICD-10-CM

## 2020-10-19 DIAGNOSIS — O0990 Supervision of high risk pregnancy, unspecified, unspecified trimester: Secondary | ICD-10-CM

## 2020-10-19 DIAGNOSIS — O09522 Supervision of elderly multigravida, second trimester: Secondary | ICD-10-CM

## 2020-10-19 DIAGNOSIS — O99019 Anemia complicating pregnancy, unspecified trimester: Secondary | ICD-10-CM

## 2020-10-19 DIAGNOSIS — M329 Systemic lupus erythematosus, unspecified: Secondary | ICD-10-CM

## 2020-10-19 DIAGNOSIS — O10012 Pre-existing essential hypertension complicating pregnancy, second trimester: Secondary | ICD-10-CM | POA: Diagnosis not present

## 2020-10-19 DIAGNOSIS — O99340 Other mental disorders complicating pregnancy, unspecified trimester: Secondary | ICD-10-CM

## 2020-10-19 DIAGNOSIS — O10019 Pre-existing essential hypertension complicating pregnancy, unspecified trimester: Secondary | ICD-10-CM

## 2020-10-19 DIAGNOSIS — O99891 Other specified diseases and conditions complicating pregnancy: Secondary | ICD-10-CM | POA: Diagnosis not present

## 2020-10-19 DIAGNOSIS — O99342 Other mental disorders complicating pregnancy, second trimester: Secondary | ICD-10-CM

## 2020-10-19 DIAGNOSIS — J45909 Unspecified asthma, uncomplicated: Secondary | ICD-10-CM

## 2020-10-19 DIAGNOSIS — O99512 Diseases of the respiratory system complicating pregnancy, second trimester: Secondary | ICD-10-CM

## 2020-10-19 DIAGNOSIS — Z148 Genetic carrier of other disease: Secondary | ICD-10-CM

## 2020-10-19 DIAGNOSIS — E669 Obesity, unspecified: Secondary | ICD-10-CM

## 2020-10-20 NOTE — Progress Notes (Signed)
Referring Provider:  Constant, Peggy Length of Consultation: 25 minutes  Natalie Chase was referred to Maternal Fetal Care for genetic counseling because of advanced maternal age and sickle cell trait in herself and her partner.  The patient will be 40 years old at the time of delivery.  This note summarizes the information we discussed.  The patient was present at this visit alone though her partner was on the phone for part of the visit.  Advanced Maternal Age: We explained that the chance of a chromosome abnormality increases with maternal age.  Chromosomes and examples of chromosome problems were reviewed.  Humans typically have 46 chromosomes in each cell, with half passed through each sperm and egg.  Any change in the number or structure of chromosomes can increase the risk of problems in the physical and mental development of a pregnancy.   Based upon age of the patient and the current gestational age, the chance of any chromosome abnormality was 1 in 39. The chance of Down syndrome, the most common chromosome problem associated with maternal age, was 1 in 65.  The risk of chromosome problems is in addition to the 3% general population risk for birth defects and intellectual disabilities.  The greatest chance, of course, is that the baby would be born in good health.  We discussed the following prenatal screening and testing options for this pregnancy:  Cell free fetal DNA testing analyzes maternal blood to determine whether or not the baby may have Down syndrome, trisomy 46, or trisomy 13.  This test utilizes a maternal blood sample and DNA sequencing technology to isolate circulating cell free fetal DNA from maternal plasma.  The fetal DNA can then be analyzed for DNA sequences that are derived from the three most common chromosomes involved in aneuploidy, chromosomes 13, 18, and 21.  If the overall amount of DNA is greater than the expected level for any of these chromosomes, aneuploidy is  suspected.  While we do not consider it a replacement for invasive testing and karyotype analysis, a negative result from this testing would be reassuring, though not a guarantee of a normal chromosome complement for the baby.  An abnormal result is certainly suggestive of an abnormal chromosome complement, though we would still recommend CVS or amniocentesis to confirm any findings from this testing. Prior Panorama testing was performed through her OB and was within normal limits.  Maternal serum marker screening, a blood test that measures pregnancy proteins, can provide risk assessments for Down syndrome, trisomy 18, and open neural tube defects (spina bifida, anencephaly). Because it does not directly examine the fetus, it cannot positively diagnose or rule out these problems. The detection rate is approximately 75% for Down syndrome, 70% for trisomy 18 and 80% of open neural tube defects. If prior chromosome screening has been performed, then AFP only is recommended to test for open neural tube defects alone. AFP only screening was also within normal limits.  Targeted ultrasound uses high frequency sound waves to create an image of the developing fetus.  An ultrasound is often recommended as a routine means of evaluating the pregnancy.  It is also used to screen for fetal anatomy problems (for example, a heart defect) that might be suggestive of a chromosomal or other abnormality.   Amniocentesis involves the removal of a small amount of amniotic fluid from the sac surrounding the fetus with the use of a thin needle inserted through the maternal abdomen and uterus.  Ultrasound guidance is used throughout the  procedure.  Fetal cells from amniotic fluid are directly evaluated and > 99.5% of chromosome problems and > 98% of open neural tube defects can be detected. This procedure is generally performed after the 15th week of pregnancy.  The main risks to this procedure include complications leading to  miscarriage in less than 1 in 200 cases (0.5%).  Sickle Cell Disease: Prior carrier screening with the Horizon 14 gene panel was performed in 2020.  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  She was negative for the other 13 conditions.  See that report for details.  Per the patient's report, her partner, Rachael Fee, is also a carrier for sickle cell trait.  We discussed that SCD is one condition in a group of blood disorders that affect hemoglobin in red blood cells (hemoglobinopathies). Hemoglobin is a protein that transports oxygen from the lungs to organs and tissues throughout the body. Individuals with SCD have an inherited structural abnormality in hemoglobin's beta globin chains due to a single amino acid change in the HBB gene. Instead of producing normal adult hemoglobin (HbA), individuals with SCD produce an atypical form of hemoglobin called hemoglobin S (HbS). Typically, individuals are expected to have two copies of HbA (HbAA). Individuals who are carriers of SCD have one copy of HbA and one copy of HbS (HbS), whereas individuals affected by SCD have two copies of HbS (HbSS). Carriers of SCD are often said to have sickle cell "trait".   HbS alters the configuration of the hemoglobin molecule. As a result, individuals with SCD have red blood cells that can change shape (sickle) and obstruct blood flow in small blood vessels, causing ischemia of tissues and organs and episodes of vaso-occlusive crisis. The amino acid change in the HBB gene also causes red blood cells to become fragile and break down easily, which results in chronic anemia. Additional complications associated with SCD may include organ damage, frequent infections, acute chest syndrome, ischemic stroke, splenic sequestration, priapism, and pulmonary hypertension. SCD is inherited in an autosomal recessive pattern, where both parents must carry HbS trait to be at risk of having an affected child. Because  Darryl  is also reported to be a carrier (we do not have a copy of his report today), the couple has a 1 in 4 (25%) chance of having a child with SCD for each pregnancy.    Given the 1 in 4 chance for sickle cell disease in this pregnancy, we offered the option of prenatal diagnosis through amniocentesis.  We talked about the risks, benefits and limitations of this procedure.  The patient is not interested in amniocentesis and stated that she would not change the course of the pregnancy based upon this testing. She was informed that newborn screening will test for sickle cell disease in all babies in [place], and she is comfortable waiting until after birth for those results.  Family history: We obtained a detailed family history and pregnancy history. Ms. Zada Finders stated that her mother passed away related to sickle cell anemia, Lupus and heart problems.  She also reported that her 36 year old son has been diagnosed with glucose-6-phosphate dehydrogenase (G6PD) deficiency. G6PD deficiency is a condition that mainly affects the red blood cells. The most common feature associated with G6PD deficiency is hemolytic anemia, in which red blood cells are destroyed faster than the body can replace them. Hemolytic anemia is often triggered by infections or certain medications. Other common features of the condition include dark urine,  jaundice, fatigue, shortness of breath, and rapid heart rate. G6PD deficiency is also a significant cause of mild to severe jaundice in newborns. However, many individuals with G6PD deficiency may never experience any signs or symptoms of the condition, and in fact may be unaware of their affected status altogether. G6PD deficiency is caused by pathogenic variants in the G6PD gene. This gene encodes instructions for creating the protein glucose-6-phosphate dehydrogenase (G6PD). This enzyme is involved in protecting red blood cells from reactive oxygen species, which are byproducts of normal  cellular functions in the body. Pathogenic variants in the G6PD gene lead to a reduced amount or altered structure of the G6PD enzyme, allowing reactive oxygen species to accumulate to toxic levels in the body. This damages red blood cells and ultimately causes hemolysis.     G6PD deficiency is inherited in an X-linked pattern. Given that females have two X chromosomes, it is expected that females have two copies of the G6PD gene. Since males only have one X chromosome, they only have one copy of the G6PD gene. If males inherit a pathogenic variant in the G6PD gene, that one variant is enough to cause G6PD deficiency. Females may also inherit a pathogenic variant and be carriers for the condition; however, female carriers are often less severely affected by the condition than males and may not display any symptoms at all since they generally have one normally functioning G6PD allele that mitigates the effects of the allele with the pathogenic variant. G6PD deficiency is a common condition in the [place] population with a carrier frequency of ~1 in 5. We would presume that Ms. Bilski is a carrier for this condition.  Each of her sons has a 50% chance to have the condition and each of her daughters has a 50% chance to be a carrier. This condition can also be diagnosed prenatally if desired, or testing can be performed after birth.  The remainder of the family history is unremarkable for birth defects, developmental delays, recurrent pregnancy loss or known chromosome abnormalities.  Ms. Broberg reported no complications or exposures in this pregnancy and no exposure to alcohol, tobacco, recreational drugs or prescription medications.  Plan of Care: Declines amniocentesis for sickle cell disease.  Pt to follow up on newborn screening results with her pediatrician. Follow up with pediatrician regarding history of G6PD deficiency in her son.  An ultrasound was performed at the time of the visit.  The  gestational age was consistent with 24 weeks.  Fetal anatomy was seen previously and the growth was appropriate today.  Please refer to the ultrasound report for details of that study.  Ms. Laidler was encouraged to call with questions or concerns.  We can be contacted at [phone].   Cherly Anderson, MS, CGC

## 2020-10-21 ENCOUNTER — Encounter: Payer: Self-pay | Admitting: Physical Therapy

## 2020-10-21 ENCOUNTER — Other Ambulatory Visit: Payer: Self-pay

## 2020-10-21 ENCOUNTER — Ambulatory Visit: Payer: Medicaid Other | Attending: Family Medicine | Admitting: Physical Therapy

## 2020-10-21 DIAGNOSIS — M5441 Lumbago with sciatica, right side: Secondary | ICD-10-CM

## 2020-10-21 DIAGNOSIS — R2689 Other abnormalities of gait and mobility: Secondary | ICD-10-CM

## 2020-10-21 DIAGNOSIS — M6281 Muscle weakness (generalized): Secondary | ICD-10-CM

## 2020-10-21 DIAGNOSIS — R252 Cramp and spasm: Secondary | ICD-10-CM

## 2020-10-21 NOTE — Therapy (Addendum)
[facility] [address], [place], [place], [address] Phone: [phone]   Fax:  [phone]  Physical Therapy Evaluation  Patient Details  Name: Natalie Chase MRN: [id number] Date of Birth: Apr 07, 1980 Referring Provider (PT): Clarnce Flock, MD   Encounter Date: 10/21/2020   PT End of Session - 10/21/20 1315     Visit Number 1    Date for PT Re-Evaluation 12/30/20    Authorization Type [employer]    Authorization Time Period Requested 10 visits 8/25 - 11/3    PT Start Time 1020    PT Stop Time 1100    PT Time Calculation (min) 40 min    Activity Tolerance Patient limited by pain    Behavior During Therapy [facility] for tasks assessed/performed             Past Medical History:  Diagnosis Date   Asthma    Hypertension    Lupus (systemic lupus erythematosus) ([place])    Pre-diabetes 05/2020   Sickle cell trait ([place])    Vaginal Pap smear, abnormal     Past Surgical History:  Procedure Laterality Date   CHOLECYSTECTOMY      There were no vitals filed for this visit.    Subjective Assessment - 10/21/20 1021     Subjective Patient presenting due to 2 month history of Rt LE radicular symptoms and low back pain. Patient is currently [redacted] weeks pregnant and this is her 4th pregnancy. She did not experience this during any of her other pregnancies.    Pertinent History pregnancy- 24 weeks    Limitations Standing;Walking;Sitting    How long can you sit comfortably? <5 minutes    Patient Stated Goals pain control    Currently in Pain? Yes    Pain Score 6     Pain Location Back    Pain Orientation Right    Pain Descriptors / Indicators Aching;Sharp    Pain Type Chronic pain    Pain Radiating Towards Rt LE into groin and proximal, anterior thight    Pain Onset More than a month ago    Pain Frequency Constant                [facility] PT Assessment - 10/21/20 0001       Assessment   Medical Diagnosis  M54.50 (ICD-10-CM) - Acute right-sided low back pain without sciatica    Referring Provider (PT) Clarnce Flock, MD    Hand Dominance Right      Balance Screen   Has the patient fallen in the past 6 months No    Has the patient had a decrease in activity level because of a fear of falling?  No    Is the patient reluctant to leave their home because of a fear of falling?  No      Prior Function   Level of Independence Independent    Vocation Full time employment   has had to reduce to part-time   Vocation Requirements pediatric behavioral therapist      Cognition   Overall Cognitive Status Within Functional Limits for tasks assessed      Observation/Other Assessments   Focus on Therapeutic Outcomes (FOTO)  20      Posture/Postural Control   Posture/Postural Control No significant limitations      ROM / Strength   AROM / PROM / Strength Strength      Strength   Strength Assessment Site Hip;Knee;Ankle    Right  Hip Flexion 2+/5    Right Hip ABduction 4/5    Right Hip ADduction 4/5    Left Hip Flexion 3+/5    Left Hip ABduction 4+/5    Left Hip ADduction 4+/5    Right/Left Knee Right;Left    Right Knee Flexion 4/5    Right Knee Extension 4/5    Left Knee Flexion 4+/5    Left Knee Extension 4+/5    Right/Left Ankle Right;Left    Right Ankle Dorsiflexion 4/5    Right Ankle Plantar Flexion 4/5    Left Ankle Dorsiflexion 4+/5    Left Ankle Plantar Flexion 4+/5      Palpation   Palpation comment tender with palpation to Rt lumbar paraspinals, Rt QL, Rt glutes, Rt piriformis      Special Tests    Special Tests Lumbar;Sacrolliac Tests    Lumbar Tests Slump Test;Straight Leg Raise    Sacroiliac Tests  Pelvic Compression      Slump test   Findings Positive    Side Right      Straight Leg Raise   Findings Unable to test    Comment due to pain      Pelvic Compression   comment patient reporting partial relief of symptoms with pelvic compression      Bed Mobility    Bed Mobility Supine to Sit;Sit to Supine    Supine to Sit Moderate Assistance - Patient 50-74%    Sit to Supine Independent   with increased time     Transfers   Five time sit to stand comments  unable to perform due to pain      Ambulation/Gait   Ambulation/Gait Yes    Ambulation/Gait Assistance 7: Independent    Ambulation Distance (Feet) 75 Feet    Assistive device None    Gait Pattern Decreased arm swing - right;Decreased arm swing - left;Step-through pattern;Decreased stance time - right;Antalgic                        Objective measurements completed on examination: See above findings.                 PT Short Term Goals - 10/21/20 1104       PT SHORT TERM GOAL #1   Title Patient will be independent with HEP for continued progression at home.    Time 5    Period Weeks    Status New    Target Date 11/25/20      PT SHORT TERM GOAL #2   Title Patient will perform supine to sit independently without increased pain.    Baseline moderate assistance    Time 5    Period Weeks    Status New    Target Date 11/25/20      PT SHORT TERM GOAL #3   Title Patient will complete five times sit to stand from standard chair and using armrests without being limited due to pain.    Time 5    Period Weeks    Status New    Target Date 11/25/20               PT Long Term Goals - 10/21/20 1106       PT LONG TERM GOAL #1   Title Patient will be independent with advanced HEP for long term management of symptoms post D/C.    Time 10    Period Weeks    Status New  Target Date 12/30/20      PT LONG TERM GOAL #2   Title Patient will report no more than 3/10 pain for 3 consecutive visits to indicate improved activity tolerance.    Time 10    Period Weeks    Status New    Target Date 12/30/20      PT LONG TERM GOAL #3   Title Patient will score 46 or higher on FOTO to indicate improved overall function.    Baseline 20    Time 10    Period Weeks     Status New    Target Date 12/30/20      PT LONG TERM GOAL #4   Title Patient will demo 4+/5 gross Rt LE strength to more readily perform bed mobility and functional transfers.    Time 10    Period Weeks    Status New    Target Date 12/30/20                    Plan - 10/21/20 1058     Clinical Impression Statement Patient is a 40 y/o female referred due to low back pain. Patient further endorses radicular symptoms that radiate into Rt LE. PMH includes HTN and patient is currently [redacted] weeks pregnant. Patient reported limitations include all daily activities as patient reports constant pain. Unable to formally assess lumbar AROM as patient unable to tolerate secondary to pain. She exhibits significant Rt LE strength impairments. Slump test positive indicating possible radicular involvement. Patient endorses tenderness with palpation to Rt paraspinals, Rt glutes, and Rt piriformis. She reports pain with all attempts at provocative testing for lumbar and hips. She reports partial relief of symptoms with pelvic compression test. She demos antalgic gait pattern with decreased Rt stance time and decreased hip flexion in swing phase bilaterally. Patient would benefit from skilled therapeutic intervention including aquatic therapy to address impairments for decreased pain and improved activity tolerance.    Personal Factors and Comorbidities Comorbidity 2    Comorbidities HTN, [redacted] weeks pregnant    Stability/Clinical Decision Making Stable/Uncomplicated    Clinical Decision Making Low    Rehab Potential Good    PT Frequency 1x / week    PT Duration Other (comment)   10 weeks   PT Treatment/Interventions ADLs/Self Care Home Management;Aquatic Therapy;Cryotherapy;Moist Heat;Gait training;Functional mobility training;Therapeutic activities;Therapeutic exercise;Patient/family education;Manual techniques;Passive range of motion;Dry needling;Taping    PT Next Visit Plan aquatic therapy session;  create HEP    Consulted and Agree with Plan of Care Patient             Patient will benefit from skilled therapeutic intervention in order to improve the following deficits and impairments:  Abnormal gait, Decreased activity tolerance, Decreased endurance, Decreased range of motion, Decreased strength, Difficulty walking, Increased muscle spasms, Impaired flexibility, Postural dysfunction, Improper body mechanics, Pain  Visit Diagnosis: Acute right-sided low back pain with right-sided sciatica - Plan: PT plan of care cert/re-cert  Cramp and spasm - Plan: PT plan of care cert/re-cert  Muscle weakness (generalized) - Plan: PT plan of care cert/re-cert  Other abnormalities of gait and mobility - Plan: PT plan of care cert/re-cert     Problem List Patient Active Problem List   Diagnosis Date Noted   [redacted] weeks gestation of pregnancy 10/13/2020   Advanced maternal age in multigravida, second trimester 10/13/2020   Supervision of high risk pregnancy, antepartum 07/12/2020   Lupus (systemic lupus erythematosus) (HCC)    Sickle cell trait in  mother affecting pregnancy ([place]) 11/14/2018   Chronic hypertension affecting pregnancy 10/29/2018   Hypertension 06/11/2017    Everardo All PT, DPT 10/21/20 1:19 PM  PHYSICAL THERAPY DISCHARGE SUMMARY  Visits from Start of Care: 1  Current functional level related to goals / functional outcomes: See above.  Pt didn't return to PT.   Remaining deficits: See above.   Education / Equipment: HEP   Patient agrees to discharge. Patient goals were not met. Patient is being discharged due to not returning since the last visit.  Sigurd Sos, PT 12/01/20 10:56 AM   [facility] [address] [place], [place], [address] Phone: [phone]   Fax:  [phone]  Name: Natalie Chase MRN: [id number] Date of Birth: 02/01/1981

## 2020-11-03 ENCOUNTER — Ambulatory Visit: Payer: Medicaid Other | Admitting: Physical Therapy

## 2020-11-10 ENCOUNTER — Other Ambulatory Visit (HOSPITAL_COMMUNITY)
Admission: RE | Admit: 2020-11-10 | Discharge: 2020-11-10 | Disposition: A | Discharge status: Home / Self Care | Payer: Medicaid Other | Source: Ambulatory Visit | Attending: Obstetrics and Gynecology | Admitting: Obstetrics and Gynecology

## 2020-11-10 ENCOUNTER — Other Ambulatory Visit: Payer: Medicaid Other

## 2020-11-10 ENCOUNTER — Other Ambulatory Visit: Payer: Self-pay

## 2020-11-10 ENCOUNTER — Ambulatory Visit: Payer: Medicaid Other | Admitting: Physical Therapy

## 2020-11-10 ENCOUNTER — Ambulatory Visit (INDEPENDENT_AMBULATORY_CARE_PROVIDER_SITE_OTHER): Payer: Medicaid Other | Admitting: Obstetrics and Gynecology

## 2020-11-10 VITALS — BP 116/72 | HR 98 | Wt 197.0 lb

## 2020-11-10 DIAGNOSIS — D573 Sickle-cell trait: Secondary | ICD-10-CM

## 2020-11-10 DIAGNOSIS — O26892 Other specified pregnancy related conditions, second trimester: Secondary | ICD-10-CM | POA: Insufficient documentation

## 2020-11-10 DIAGNOSIS — N898 Other specified noninflammatory disorders of vagina: Secondary | ICD-10-CM | POA: Insufficient documentation

## 2020-11-10 DIAGNOSIS — Z3A26 26 weeks gestation of pregnancy: Secondary | ICD-10-CM

## 2020-11-10 DIAGNOSIS — O0990 Supervision of high risk pregnancy, unspecified, unspecified trimester: Secondary | ICD-10-CM

## 2020-11-10 DIAGNOSIS — O09522 Supervision of elderly multigravida, second trimester: Secondary | ICD-10-CM

## 2020-11-10 DIAGNOSIS — O99019 Anemia complicating pregnancy, unspecified trimester: Secondary | ICD-10-CM

## 2020-11-10 DIAGNOSIS — Z23 Encounter for immunization: Secondary | ICD-10-CM

## 2020-11-10 DIAGNOSIS — O10919 Unspecified pre-existing hypertension complicating pregnancy, unspecified trimester: Secondary | ICD-10-CM

## 2020-11-10 DIAGNOSIS — M329 Systemic lupus erythematosus, unspecified: Secondary | ICD-10-CM

## 2020-11-10 NOTE — Progress Notes (Signed)
+   Fetal movement. Pt states she is having increased discharge. No itching, odor, or burning. Concerned about leaking fluid.

## 2020-11-10 NOTE — Progress Notes (Signed)
   PRENATAL VISIT NOTE  Subjective:  Natalie Chase is a 40 y.o. [phone] at [redacted]w[redacted]d being seen today for ongoing prenatal care.  She is currently monitored for the following issues for this high-risk pregnancy and has Hypertension; Chronic hypertension affecting pregnancy; Sickle cell trait in mother affecting pregnancy (HCC); Lupus (systemic lupus erythematosus) (HCC); Supervision of high risk pregnancy, antepartum; [redacted] weeks gestation of pregnancy; Advanced maternal age in multigravida, second trimester; [redacted] weeks gestation of pregnancy; and Vaginal discharge during pregnancy in second trimester on their problem list.  Patient doing well with no acute concerns today. She reports  questionable leakage of fluid .  Contractions: Irritability. Vag. Bleeding: None.  Movement: Present. Denies leaking of fluid.   The following portions of the patient's history were reviewed and updated as appropriate: allergies, current medications, past family history, past medical history, past social history, past surgical history and problem list. Problem list updated.  Objective:   Vitals:   11/10/20 0920  BP: 116/72  Pulse: 98  Weight: 197 lb (89.4 kg)    Fetal Status: Fetal Heart Rate (bpm): 149 Fundal Height: 26 cm Movement: Present     General:  Alert, oriented and cooperative. Patient is in no acute distress.  Skin: Skin is warm and dry. No rash noted.   Cardiovascular: Normal heart rate noted  Respiratory: Normal respiratory effort, no problems with respiration noted  Abdomen: Soft, gravid, appropriate for gestational age.  Pain/Pressure: Present     Pelvic: Cervical exam performed        Extremities: Normal range of motion.  Edema: Trace  Mental Status:  Normal mood and affect. Normal behavior. Normal judgment and thought content.  Cervic vidsually close, negative pooling, negative fern Assessment and Plan:  Pregnancy: [id number] at [redacted]w[redacted]d  1. Chronic hypertension affecting pregnancy Good control with  nifedipine Growth scan per MFM  2. [redacted] weeks gestation of pregnancy   3. Supervision of high risk pregnancy, antepartum Continue routine care - RPR - CBC - Glucose Tolerance, 2 Hours w/1 Hour - HIV antibody (with reflex) - Flu Vaccine QUAD 36+ mos IM (Fluarix, Quad PF)  4. Sickle cell trait in mother affecting pregnancy (HCC)   5. Systemic lupus erythematosus, unspecified SLE type, unspecified organ involvement status (HCC) No involvement at this time  6. Advanced maternal age in multigravida, second trimester   7. Vaginal discharge during pregnancy in second trimester Visually physiologic discharge - Cervicovaginal ancillary only( [place])  Preterm labor symptoms and general obstetric precautions including but not limited to vaginal bleeding, contractions, leaking of fluid and fetal movement were reviewed in detail with the patient.  Please refer to After Visit Summary for other counseling recommendations.   Return in about 2 weeks (around 11/24/2020) for [facility], in person.   Mariel Aloe, MD Faculty Attending Center for [facility]

## 2020-11-11 LAB — CERVICOVAGINAL ANCILLARY ONLY
Bacterial Vaginitis (gardnerella): NEGATIVE
Candida Glabrata: NEGATIVE
Candida Vaginitis: NEGATIVE
Comment: NEGATIVE
Comment: NEGATIVE
Comment: NEGATIVE

## 2020-11-11 LAB — CBC
Hematocrit: 34.2 % (ref 34.0–46.6)
Hemoglobin: 11.4 g/dL (ref 11.1–15.9)
MCH: 27.3 pg (ref 26.6–33.0)
MCHC: 33.3 g/dL (ref 31.5–35.7)
MCV: 82 fL (ref 79–97)
Platelets: 363 10*3/uL (ref 150–450)
RBC: 4.18 x10E6/uL (ref 3.77–5.28)
RDW: 15 % (ref 11.7–15.4)
WBC: 14.1 10*3/uL — ABNORMAL HIGH (ref 3.4–10.8)

## 2020-11-11 LAB — RPR: RPR Ser Ql: NONREACTIVE

## 2020-11-11 LAB — GLUCOSE TOLERANCE, 2 HOURS W/ 1HR
Glucose, 1 hour: 167 mg/dL (ref 65–179)
Glucose, 2 hour: 82 mg/dL (ref 65–152)
Glucose, Fasting: 77 mg/dL (ref 65–91)

## 2020-11-11 LAB — HIV ANTIBODY (ROUTINE TESTING W REFLEX): HIV Screen 4th Generation wRfx: NONREACTIVE

## 2020-11-16 ENCOUNTER — Encounter: Payer: Self-pay | Admitting: *Deleted

## 2020-11-16 ENCOUNTER — Other Ambulatory Visit: Payer: Self-pay

## 2020-11-16 ENCOUNTER — Ambulatory Visit: Payer: Medicaid Other | Attending: Maternal & Fetal Medicine

## 2020-11-16 ENCOUNTER — Other Ambulatory Visit: Payer: Self-pay | Admitting: *Deleted

## 2020-11-16 ENCOUNTER — Ambulatory Visit: Payer: Medicaid Other | Admitting: *Deleted

## 2020-11-16 VITALS — BP 105/61 | HR 100

## 2020-11-16 DIAGNOSIS — M329 Systemic lupus erythematosus, unspecified: Secondary | ICD-10-CM | POA: Diagnosis present

## 2020-11-16 DIAGNOSIS — O0990 Supervision of high risk pregnancy, unspecified, unspecified trimester: Secondary | ICD-10-CM | POA: Insufficient documentation

## 2020-11-16 DIAGNOSIS — O09522 Supervision of elderly multigravida, second trimester: Secondary | ICD-10-CM | POA: Diagnosis present

## 2020-11-16 DIAGNOSIS — O99213 Obesity complicating pregnancy, third trimester: Secondary | ICD-10-CM | POA: Diagnosis not present

## 2020-11-16 DIAGNOSIS — O10013 Pre-existing essential hypertension complicating pregnancy, third trimester: Secondary | ICD-10-CM | POA: Diagnosis not present

## 2020-11-16 DIAGNOSIS — Z6834 Body mass index (BMI) 34.0-34.9, adult: Secondary | ICD-10-CM

## 2020-11-16 DIAGNOSIS — O99212 Obesity complicating pregnancy, second trimester: Secondary | ICD-10-CM | POA: Diagnosis present

## 2020-11-16 DIAGNOSIS — O09523 Supervision of elderly multigravida, third trimester: Secondary | ICD-10-CM | POA: Diagnosis not present

## 2020-11-16 DIAGNOSIS — O10919 Unspecified pre-existing hypertension complicating pregnancy, unspecified trimester: Secondary | ICD-10-CM | POA: Insufficient documentation

## 2020-11-16 DIAGNOSIS — O10019 Pre-existing essential hypertension complicating pregnancy, unspecified trimester: Secondary | ICD-10-CM | POA: Diagnosis present

## 2020-11-16 DIAGNOSIS — O99322 Drug use complicating pregnancy, second trimester: Secondary | ICD-10-CM | POA: Diagnosis present

## 2020-11-16 DIAGNOSIS — O99323 Drug use complicating pregnancy, third trimester: Secondary | ICD-10-CM

## 2020-11-16 DIAGNOSIS — O99340 Other mental disorders complicating pregnancy, unspecified trimester: Secondary | ICD-10-CM | POA: Insufficient documentation

## 2020-11-16 DIAGNOSIS — O99343 Other mental disorders complicating pregnancy, third trimester: Secondary | ICD-10-CM

## 2020-11-16 DIAGNOSIS — Z3A28 28 weeks gestation of pregnancy: Secondary | ICD-10-CM

## 2020-11-16 DIAGNOSIS — O10913 Unspecified pre-existing hypertension complicating pregnancy, third trimester: Secondary | ICD-10-CM

## 2020-11-17 ENCOUNTER — Ambulatory Visit: Payer: Medicaid Other | Attending: Family Medicine | Admitting: Physical Therapy

## 2020-11-17 ENCOUNTER — Encounter: Payer: Self-pay | Admitting: Physical Therapy

## 2020-11-18 ENCOUNTER — Other Ambulatory Visit (HOSPITAL_COMMUNITY): Payer: Self-pay

## 2020-11-23 ENCOUNTER — Other Ambulatory Visit: Payer: Self-pay

## 2020-11-23 ENCOUNTER — Ambulatory Visit (INDEPENDENT_AMBULATORY_CARE_PROVIDER_SITE_OTHER): Payer: Medicaid Other | Admitting: Obstetrics and Gynecology

## 2020-11-23 VITALS — BP 107/70 | HR 100 | Wt 197.0 lb

## 2020-11-23 DIAGNOSIS — O99019 Anemia complicating pregnancy, unspecified trimester: Secondary | ICD-10-CM

## 2020-11-23 DIAGNOSIS — D573 Sickle-cell trait: Secondary | ICD-10-CM

## 2020-11-23 DIAGNOSIS — Z3A29 29 weeks gestation of pregnancy: Secondary | ICD-10-CM | POA: Insufficient documentation

## 2020-11-23 DIAGNOSIS — O0990 Supervision of high risk pregnancy, unspecified, unspecified trimester: Secondary | ICD-10-CM

## 2020-11-23 DIAGNOSIS — O09523 Supervision of elderly multigravida, third trimester: Secondary | ICD-10-CM

## 2020-11-23 DIAGNOSIS — O10919 Unspecified pre-existing hypertension complicating pregnancy, unspecified trimester: Secondary | ICD-10-CM

## 2020-11-23 DIAGNOSIS — M329 Systemic lupus erythematosus, unspecified: Secondary | ICD-10-CM

## 2020-11-23 NOTE — Progress Notes (Signed)
   PRENATAL VISIT NOTE  Subjective:  Natalie Chase is a 40 y.o. [phone] at [redacted]w[redacted]d being seen today for ongoing prenatal care.  She is currently monitored for the following issues for this high-risk pregnancy and has Hypertension; Chronic hypertension affecting pregnancy; Sickle cell trait in mother affecting pregnancy (HCC); Lupus (systemic lupus erythematosus) (HCC); Supervision of high risk pregnancy, antepartum; [redacted] weeks gestation of pregnancy; Advanced maternal age in multigravida, third trimester; [redacted] weeks gestation of pregnancy; Vaginal discharge during pregnancy in second trimester; and [redacted] weeks gestation of pregnancy on their problem list.  Patient doing well with no acute concerns today. She reports no complaints.  Contractions: Irritability. Vag. Bleeding: None.  Movement: Present. Denies leaking of fluid.   The following portions of the patient's history were reviewed and updated as appropriate: allergies, current medications, past family history, past medical history, past social history, past surgical history and problem list. Problem list updated.  Objective:   Vitals:   11/23/20 0839  BP: 107/70  Pulse: 100  Weight: 197 lb (89.4 kg)    Fetal Status: Fetal Heart Rate (bpm): 147 Fundal Height: 29 cm Movement: Present     General:  Alert, oriented and cooperative. Patient is in no acute distress.  Skin: Skin is warm and dry. No rash noted.   Cardiovascular: Normal heart rate noted  Respiratory: Normal respiratory effort, no problems with respiration noted  Abdomen: Soft, gravid, appropriate for gestational age.  Pain/Pressure: Absent     Pelvic: Cervical exam deferred        Extremities: Normal range of motion.  Edema: None  Mental Status:  Normal mood and affect. Normal behavior. Normal judgment and thought content.   Assessment and Plan:  Pregnancy: [id number] at [redacted]w[redacted]d  1. Chronic hypertension affecting pregnancy BP well controlled on procardia, start weekly BPP at 32  weeks  2. Supervision of high risk pregnancy, antepartum Continue routine care  3. [redacted] weeks gestation of pregnancy   4. Systemic lupus erythematosus, unspecified SLE type, unspecified organ involvement status (HCC) No s/sx  5. Sickle cell trait in mother affecting pregnancy (HCC)   6. Advanced maternal age in multigravida, third trimester Repeat growth scan 10/18  Preterm labor symptoms and general obstetric precautions including but not limited to vaginal bleeding, contractions, leaking of fluid and fetal movement were reviewed in detail with the patient.  Please refer to After Visit Summary for other counseling recommendations.   Return in about 2 weeks (around 12/07/2020) for [facility], in person.   Mariel Aloe, MD Faculty Attending Center for [facility]

## 2020-11-24 ENCOUNTER — Ambulatory Visit: Payer: Medicaid Other | Admitting: Physical Therapy

## 2020-11-25 ENCOUNTER — Other Ambulatory Visit: Payer: Self-pay

## 2020-11-25 ENCOUNTER — Inpatient Hospital Stay (HOSPITAL_COMMUNITY)
Admission: AD | Admit: 2020-11-25 | Discharge: 2020-11-25 | Disposition: A | Discharge status: Home / Self Care | Payer: Medicaid Other | Attending: Family Medicine | Admitting: Family Medicine

## 2020-11-25 ENCOUNTER — Other Ambulatory Visit (HOSPITAL_COMMUNITY): Payer: Self-pay

## 2020-11-25 DIAGNOSIS — O4700 False labor before 37 completed weeks of gestation, unspecified trimester: Secondary | ICD-10-CM

## 2020-11-25 DIAGNOSIS — K5900 Constipation, unspecified: Secondary | ICD-10-CM | POA: Diagnosis not present

## 2020-11-25 DIAGNOSIS — Z7982 Long term (current) use of aspirin: Secondary | ICD-10-CM | POA: Diagnosis not present

## 2020-11-25 DIAGNOSIS — Z79899 Other long term (current) drug therapy: Secondary | ICD-10-CM | POA: Insufficient documentation

## 2020-11-25 DIAGNOSIS — Z3A29 29 weeks gestation of pregnancy: Secondary | ICD-10-CM | POA: Insufficient documentation

## 2020-11-25 DIAGNOSIS — O4703 False labor before 37 completed weeks of gestation, third trimester: Secondary | ICD-10-CM | POA: Diagnosis present

## 2020-11-25 DIAGNOSIS — Z87891 Personal history of nicotine dependence: Secondary | ICD-10-CM | POA: Diagnosis not present

## 2020-11-25 DIAGNOSIS — O99613 Diseases of the digestive system complicating pregnancy, third trimester: Secondary | ICD-10-CM | POA: Diagnosis not present

## 2020-11-25 LAB — URINALYSIS, ROUTINE W REFLEX MICROSCOPIC
Bilirubin Urine: NEGATIVE
Glucose, UA: NEGATIVE mg/dL
Hgb urine dipstick: NEGATIVE
Ketones, ur: NEGATIVE mg/dL
Leukocytes,Ua: NEGATIVE
Nitrite: NEGATIVE
Protein, ur: NEGATIVE mg/dL
Specific Gravity, Urine: 1.003 — ABNORMAL LOW (ref 1.005–1.030)
pH: 6 (ref 5.0–8.0)

## 2020-11-25 LAB — CBC
HCT: 32.1 % — ABNORMAL LOW (ref 36.0–46.0)
Hemoglobin: 10.9 g/dL — ABNORMAL LOW (ref 12.0–15.0)
MCH: 27.7 pg (ref 26.0–34.0)
MCHC: 34 g/dL (ref 30.0–36.0)
MCV: 81.7 fL (ref 80.0–100.0)
Platelets: 315 10*3/uL (ref 150–400)
RBC: 3.93 MIL/uL (ref 3.87–5.11)
RDW: 15.3 % (ref 11.5–15.5)
WBC: 14.7 10*3/uL — ABNORMAL HIGH (ref 4.0–10.5)
nRBC: 0 % (ref 0.0–0.2)

## 2020-11-25 LAB — FETAL FIBRONECTIN: Fetal Fibronectin: NEGATIVE

## 2020-11-25 MED ORDER — POLYETHYLENE GLYCOL 3350 17 G PO PACK
17.0000 g | PACK | Freq: Every day | ORAL | 2 refills | Status: DC
  Filled 2020-11-25: qty 14, 14d supply, fill #0

## 2020-11-25 NOTE — MAU Provider Note (Signed)
Chief Complaint:  Contractions   Event Date/Time   First Provider Initiated Contact with Patient 11/25/20 0403     HPI: Natalie Chase is a 40 y.o. [id number] at 11w4dwho presents to maternity admissions reporting contractions since 1;30.  Also had some LLQ pain.  Has been constipated . She reports good fetal movement, denies LOF, vaginal bleeding, vaginal itching/burning, urinary symptoms, h/a, dizziness, n/v, diarrhea, or fever/chills.  Abdominal Pain This is a new problem. The current episode started today. The problem occurs intermittently. The problem has been rapidly improving. The pain is located in the LLQ. The pain is mild. The quality of the pain is cramping (but has felt no contractions while in room). Associated symptoms include constipation. Pertinent negatives include no diarrhea, dysuria or frequency. Nothing aggravates the pain. The pain is relieved by Nothing. She has tried nothing for the symptoms.      RN Note: Pt reports ctx that started around 0130 this morning, approximately 3 minutes apart and lasting about a minute. Over the past 3 days pt has been feeling nauseated and dizzy. Has been having blurry vision with the dizzy episodes but denies HA. Some abdominal pain on L side earlier in the week, but no right sided pain  Past Medical History: Past Medical History:  Diagnosis Date   Asthma    Hypertension    Lupus (systemic lupus erythematosus) (HCC)    Pre-diabetes 05/2020   Sickle cell trait (HCC)    Vaginal Pap smear, abnormal     Past obstetric history: OB History  Gravida Para Term Preterm AB Living  4 2 2   1 2   SAB IAB Ectopic Multiple Live Births    1   0 2    # Outcome Date GA Lbr Len/2nd Weight Sex Delivery Anes PTL Lv  4 Current           3 Term 05/11/19 [redacted]w[redacted]d 04:00 / 00:53 3215 g M Vag-Spont EPI  LIV  2 Term 11/30/02 [redacted]w[redacted]d  3118 g M Vag-Spont   LIV  1 IAB             Past Surgical History: Past Surgical History:  Procedure Laterality Date    CHOLECYSTECTOMY      Family History: Family History  Problem Relation Age of Onset   Achondroplasia Mother    Lupus Mother    Sickle cell anemia Mother    Hypertension Father    Stroke Father    Cancer Other    Heart disease Maternal Grandmother     Social History: Social History   Tobacco Use   Smoking status: Former    Packs/day: 0.00    Types: Cigarettes    Quit date: 01/09/2017    Years since quitting: 3.8   Smokeless tobacco: Former  01/11/2017 Use: Never used  Substance Use Topics   Alcohol use: Not Currently   Drug use: Not Currently    Types: Marijuana    Comment: last used last week    Allergies: No Known Allergies  Meds:  Medications Prior to Admission  Medication Sig Dispense Refill Last Dose   albuterol (VENTOLIN HFA) 108 (90 Base) MCG/ACT inhaler Inhale 1-2 puffs into the lungs every 6 (six) hours as needed for wheezing or shortness of breath. 18 g 1    aspirin 81 MG EC tablet Take 1 tablet (81 mg total) by mouth daily. Take after 12 weeks for prevention of preeclampsia later in pregnancy. 300 tablet 2  cyclobenzaprine (FLEXERIL) 10 MG tablet Take 1 tablet (10 mg total) by mouth every 8 (eight) hours as needed for muscle spasms. 30 tablet 1    NIFEdipine (ADALAT CC) 60 MG 24 hr tablet Take 1 tablet (60 mg total) by mouth daily. 30 tablet 5    Prenatal Vit-Fe Fumarate-FA (NIVA-PLUS) 27-1 MG TABS Take 1 tablet by mouth daily. 30 tablet 12    sertraline (ZOLOFT) 50 MG tablet Take 1 tablet (50 mg total) by mouth daily. 30 tablet 6     I have reviewed patient's Past Medical Hx, Surgical Hx, Family Hx, Social Hx, medications and allergies.   ROS:  Review of Systems  Gastrointestinal:  Positive for abdominal pain and constipation. Negative for diarrhea.  Genitourinary:  Negative for dysuria and frequency.  Other systems negative  Physical Exam  No data found. Constitutional: Well-developed, well-nourished female in no acute distress.   Cardiovascular: normal rate  Respiratory: normal effort GI: Abd soft, non-tender, gravid appropriate for gestational age.   No rebound or guarding. MS: Extremities nontender, no edema, normal ROM Neurologic: Alert and oriented x 4.  GU: Neg CVAT.  PELVIC EXAM:  Dilation: Closed Effacement (%): Thick Station: Ballotable Exam by:: Wynelle Bourgeois, CNM   FHT:  Baseline 140 , moderate variability, accelerations present, no decelerations Contractions: none   Labs: Results for orders placed or performed during the hospital encounter of 11/25/20 (from the past 24 hour(s))  Fetal fibronectin     Status: None   Collection Time: 11/25/20  4:23 AM  Result Value Ref Range   Fetal Fibronectin NEGATIVE NEGATIVE  CBC     Status: Abnormal   Collection Time: 11/25/20  4:50 AM  Result Value Ref Range   WBC 14.7 (H) 4.0 - 10.5 K/uL   RBC 3.93 3.87 - 5.11 MIL/uL   Hemoglobin 10.9 (L) 12.0 - 15.0 g/dL   HCT 26.7 (L) 12.4 - 58.0 %   MCV 81.7 80.0 - 100.0 fL   MCH 27.7 26.0 - 34.0 pg   MCHC 34.0 30.0 - 36.0 g/dL   RDW 99.8 33.8 - 25.0 %   Platelets 315 150 - 400 K/uL   nRBC 0.0 0.0 - 0.2 %  Urinalysis, Routine w reflex microscopic     Status: Abnormal   Collection Time: 11/25/20  5:27 AM  Result Value Ref Range   Color, Urine COLORLESS (A) YELLOW   APPearance CLEAR CLEAR   Specific Gravity, Urine 1.003 (L) 1.005 - 1.030   pH 6.0 5.0 - 8.0   Glucose, UA NEGATIVE NEGATIVE mg/dL   Hgb urine dipstick NEGATIVE NEGATIVE   Bilirubin Urine NEGATIVE NEGATIVE   Ketones, ur NEGATIVE NEGATIVE mg/dL   Protein, ur NEGATIVE NEGATIVE mg/dL   Nitrite NEGATIVE NEGATIVE   Leukocytes,Ua NEGATIVE NEGATIVE    O/Positive/-- (05/24 1433)  Imaging:    MAU Course/MDM: I have ordered CBC and UA and reviewed results. Mild leukocytosis which appears to be her baseline.  NST reviewed, reassuring for gestational age    Never had any contractions while here  Treatments in MAU included EFM.     Assessment: Single IUP at. [redacted]w[redacted]d Preterm contractions at home, now resolved Constipation  Plan: Discharge home Preterm Labor precautions and fetal kick counts Follow up in Office for prenatal visits  Miralax for constipation Encouraged to return if she develops worsening of symptoms, increase in pain, fever, or other concerning symptoms.  Pt stable at time of discharge.  Wynelle Bourgeois CNM, MSN Certified Nurse-Midwife 11/25/2020 4:03 AM

## 2020-11-25 NOTE — MAU Note (Signed)
Pt reports ctx that started around 0130 this morning, approximately 3 minutes apart and lasting about a minute. Over the past 3 days pt has been feeling nauseated and dizzy. Has been having blurry vision with the dizzy episodes but denies HA. Some abdominal pain on L side earlier in the week, but no right sided pain.

## 2020-12-03 ENCOUNTER — Ambulatory Visit: Payer: Medicaid Other | Admitting: Physical Therapy

## 2020-12-07 ENCOUNTER — Ambulatory Visit (INDEPENDENT_AMBULATORY_CARE_PROVIDER_SITE_OTHER): Payer: Medicaid Other | Admitting: Obstetrics & Gynecology

## 2020-12-07 ENCOUNTER — Other Ambulatory Visit: Payer: Self-pay

## 2020-12-07 VITALS — BP 107/69 | HR 97 | Wt 204.7 lb

## 2020-12-07 DIAGNOSIS — O0990 Supervision of high risk pregnancy, unspecified, unspecified trimester: Secondary | ICD-10-CM

## 2020-12-07 DIAGNOSIS — O10919 Unspecified pre-existing hypertension complicating pregnancy, unspecified trimester: Secondary | ICD-10-CM

## 2020-12-07 DIAGNOSIS — M329 Systemic lupus erythematosus, unspecified: Secondary | ICD-10-CM

## 2020-12-07 NOTE — Progress Notes (Signed)
   PRENATAL VISIT NOTE  Subjective:  Natalie Chase is a 40 y.o. [phone] at [redacted]w[redacted]d being seen today for ongoing prenatal care.  She is currently monitored for the following issues for this high-risk pregnancy and has Hypertension; Chronic hypertension affecting pregnancy; Sickle cell trait in mother affecting pregnancy (HCC); Lupus (systemic lupus erythematosus) (HCC); Supervision of high risk pregnancy, antepartum; [redacted] weeks gestation of pregnancy; Advanced maternal age in multigravida, third trimester; [redacted] weeks gestation of pregnancy; Vaginal discharge during pregnancy in second trimester; and [redacted] weeks gestation of pregnancy on their problem list.  Patient reports no complaints.  Contractions: Irritability. Vag. Bleeding: None.  Movement: Present. Denies leaking of fluid.   The following portions of the patient's history were reviewed and updated as appropriate: allergies, current medications, past family history, past medical history, past social history, past surgical history and problem list.   Objective:   Vitals:   12/07/20 1039  BP: 107/69  Pulse: 97  Weight: 204 lb 11.2 oz (92.9 kg)    Fetal Status: Fetal Heart Rate (bpm): 147 Fundal Height: 32 cm Movement: Present     General:  Alert, oriented and cooperative. Patient is in no acute distress.  Skin: Skin is warm and dry. No rash noted.   Cardiovascular: Normal heart rate noted  Respiratory: Normal respiratory effort, no problems with respiration noted  Abdomen: Soft, gravid, appropriate for gestational age.  Pain/Pressure: Present     Pelvic: Cervical exam deferred        Extremities: Normal range of motion.  Edema: None  Mental Status: Normal mood and affect. Normal behavior. Normal judgment and thought content.   Assessment and Plan:  Pregnancy: [id number] at [redacted]w[redacted]d 1. Supervision of high risk pregnancy, antepartum Normal fetal grow  2. Systemic lupus erythematosus, unspecified SLE type, unspecified organ involvement status  (HCC) stable  3. Chronic hypertension affecting pregnancy Continue Procardia  Preterm labor symptoms and general obstetric precautions including but not limited to vaginal bleeding, contractions, leaking of fluid and fetal movement were reviewed in detail with the patient. Please refer to After Visit Summary for other counseling recommendations.   Return in about 2 weeks (around 12/21/2020).  Future Appointments  Date Time Provider Department Center  12/14/2020 10:30 AM [facility] NURSE [facility]  12/14/2020 10:45 AM [facility] US4 [facility]    Scheryl Darter, MD

## 2020-12-07 NOTE — Progress Notes (Signed)
Pt reports fetal movement with some pressure and irritability. 

## 2020-12-08 ENCOUNTER — Ambulatory Visit: Payer: Medicaid Other | Admitting: Physical Therapy

## 2020-12-14 ENCOUNTER — Ambulatory Visit: Payer: Medicaid Other | Admitting: *Deleted

## 2020-12-14 ENCOUNTER — Ambulatory Visit: Payer: Medicaid Other | Attending: Obstetrics

## 2020-12-14 ENCOUNTER — Encounter: Payer: Self-pay | Admitting: *Deleted

## 2020-12-14 ENCOUNTER — Other Ambulatory Visit: Payer: Self-pay

## 2020-12-14 VITALS — BP 117/68 | HR 97

## 2020-12-14 DIAGNOSIS — Z3A32 32 weeks gestation of pregnancy: Secondary | ICD-10-CM

## 2020-12-14 DIAGNOSIS — O10013 Pre-existing essential hypertension complicating pregnancy, third trimester: Secondary | ICD-10-CM | POA: Diagnosis not present

## 2020-12-14 DIAGNOSIS — Z6834 Body mass index (BMI) 34.0-34.9, adult: Secondary | ICD-10-CM | POA: Insufficient documentation

## 2020-12-14 DIAGNOSIS — O10919 Unspecified pre-existing hypertension complicating pregnancy, unspecified trimester: Secondary | ICD-10-CM | POA: Insufficient documentation

## 2020-12-14 DIAGNOSIS — O10913 Unspecified pre-existing hypertension complicating pregnancy, third trimester: Secondary | ICD-10-CM | POA: Insufficient documentation

## 2020-12-14 DIAGNOSIS — O0990 Supervision of high risk pregnancy, unspecified, unspecified trimester: Secondary | ICD-10-CM

## 2020-12-14 DIAGNOSIS — O09523 Supervision of elderly multigravida, third trimester: Secondary | ICD-10-CM | POA: Insufficient documentation

## 2020-12-14 DIAGNOSIS — M329 Systemic lupus erythematosus, unspecified: Secondary | ICD-10-CM | POA: Insufficient documentation

## 2020-12-15 ENCOUNTER — Ambulatory Visit: Payer: Medicaid Other | Admitting: Physical Therapy

## 2020-12-15 ENCOUNTER — Other Ambulatory Visit: Payer: Self-pay | Admitting: *Deleted

## 2020-12-15 DIAGNOSIS — M329 Systemic lupus erythematosus, unspecified: Secondary | ICD-10-CM

## 2020-12-15 DIAGNOSIS — O10913 Unspecified pre-existing hypertension complicating pregnancy, third trimester: Secondary | ICD-10-CM

## 2020-12-20 ENCOUNTER — Other Ambulatory Visit (HOSPITAL_COMMUNITY): Payer: Self-pay

## 2020-12-21 ENCOUNTER — Ambulatory Visit (INDEPENDENT_AMBULATORY_CARE_PROVIDER_SITE_OTHER): Payer: Medicaid Other | Admitting: Obstetrics & Gynecology

## 2020-12-21 ENCOUNTER — Other Ambulatory Visit: Payer: Self-pay

## 2020-12-21 VITALS — BP 126/76 | HR 96 | Wt 207.0 lb

## 2020-12-21 DIAGNOSIS — O0990 Supervision of high risk pregnancy, unspecified, unspecified trimester: Secondary | ICD-10-CM

## 2020-12-21 DIAGNOSIS — M329 Systemic lupus erythematosus, unspecified: Secondary | ICD-10-CM

## 2020-12-21 DIAGNOSIS — O10919 Unspecified pre-existing hypertension complicating pregnancy, unspecified trimester: Secondary | ICD-10-CM

## 2020-12-21 DIAGNOSIS — O09523 Supervision of elderly multigravida, third trimester: Secondary | ICD-10-CM

## 2020-12-21 NOTE — Progress Notes (Signed)
   PRENATAL VISIT NOTE  Subjective:  Natalie Chase is a 40 y.o. [phone] at [redacted]w[redacted]d being seen today for ongoing prenatal care.  She is currently monitored for the following issues for this high-risk pregnancy and has Hypertension; Chronic hypertension affecting pregnancy; Sickle cell trait in mother affecting pregnancy (HCC); Lupus (systemic lupus erythematosus) (HCC); Supervision of high risk pregnancy, antepartum; [redacted] weeks gestation of pregnancy; Advanced maternal age in multigravida, third trimester; [redacted] weeks gestation of pregnancy; Vaginal discharge during pregnancy in second trimester; and [redacted] weeks gestation of pregnancy on their problem list.  Patient reports no complaints.  Contractions: Irritability. Vag. Bleeding: None.  Movement: Present. Denies leaking of fluid.   The following portions of the patient's history were reviewed and updated as appropriate: allergies, current medications, past family history, past medical history, past social history, past surgical history and problem list.   Objective:   Vitals:   12/21/20 0829  BP: 126/76  Pulse: 96  Weight: 207 lb (93.9 kg)    Fetal Status: Fetal Heart Rate (bpm): 147   Movement: Present     General:  Alert, oriented and cooperative. Patient is in no acute distress.  Skin: Skin is warm and dry. No rash noted.   Cardiovascular: Normal heart rate noted  Respiratory: Normal respiratory effort, no problems with respiration noted  Abdomen: Soft, gravid, appropriate for gestational age.  Pain/Pressure: Present     Pelvic: Cervical exam deferred        Extremities: Normal range of motion.  Edema: None  Mental Status: Normal mood and affect. Normal behavior. Normal judgment and thought content.   Assessment and Plan:  Pregnancy: [id number] at [redacted]w[redacted]d There are no diagnoses linked to this encounter. Preterm labor symptoms and general obstetric precautions including but not limited to vaginal bleeding, contractions, leaking of fluid and  fetal movement were reviewed in detail with the patient. Please refer to After Visit Summary for other counseling recommendations.  Chronic hypertension affecting pregnancy  Supervision of high risk pregnancy, antepartum  Systemic lupus erythematosus, unspecified SLE type, unspecified organ involvement status (HCC)  Advanced maternal age in multigravida, third trimester  Return in about 2 weeks (around 01/04/2021).  Future Appointments  Date Time Provider Department Center  12/22/2020 11:15 AM [facility] NST [facility]  12/31/2020  7:15 AM [facility] NURSE [facility]  12/31/2020  7:30 AM [facility] US3 [facility]  01/07/2021  8:55 AM Warden Fillers, MD [facility] None  01/07/2021 10:30 AM [facility] NURSE [facility]  01/07/2021 10:45 AM [facility] US5 [facility]  01/14/2021  9:15 AM [facility] NURSE [facility]  01/14/2021  9:30 AM [facility] US3 [facility]    Scheryl Darter, MD

## 2020-12-21 NOTE — Progress Notes (Signed)
Patient presents for ROB. 

## 2020-12-22 ENCOUNTER — Ambulatory Visit: Payer: Medicaid Other | Admitting: Physical Therapy

## 2020-12-22 ENCOUNTER — Ambulatory Visit (INDEPENDENT_AMBULATORY_CARE_PROVIDER_SITE_OTHER): Payer: Medicaid Other

## 2020-12-22 ENCOUNTER — Ambulatory Visit: Payer: Medicaid Other | Admitting: *Deleted

## 2020-12-22 VITALS — BP 128/74 | HR 106

## 2020-12-22 DIAGNOSIS — M329 Systemic lupus erythematosus, unspecified: Secondary | ICD-10-CM

## 2020-12-22 DIAGNOSIS — O10919 Unspecified pre-existing hypertension complicating pregnancy, unspecified trimester: Secondary | ICD-10-CM | POA: Diagnosis not present

## 2020-12-22 NOTE — Progress Notes (Signed)

## 2020-12-28 ENCOUNTER — Other Ambulatory Visit: Payer: Self-pay | Admitting: Obstetrics and Gynecology

## 2020-12-28 ENCOUNTER — Other Ambulatory Visit (HOSPITAL_COMMUNITY): Payer: Self-pay

## 2020-12-28 MED ORDER — PREPLUS 27-1 MG PO TABS
1.0000 | ORAL_TABLET | Freq: Every day | ORAL | 13 refills | Status: DC
  Filled 2020-12-28: qty 30, 30d supply, fill #0

## 2020-12-31 ENCOUNTER — Ambulatory Visit: Payer: Medicaid Other

## 2020-12-31 ENCOUNTER — Ambulatory Visit: Payer: Medicaid Other | Attending: Obstetrics and Gynecology

## 2021-01-05 ENCOUNTER — Other Ambulatory Visit: Payer: Medicaid Other

## 2021-01-07 ENCOUNTER — Ambulatory Visit (INDEPENDENT_AMBULATORY_CARE_PROVIDER_SITE_OTHER): Payer: Medicaid Other | Admitting: Obstetrics and Gynecology

## 2021-01-07 ENCOUNTER — Ambulatory Visit: Payer: Medicaid Other | Attending: Obstetrics and Gynecology

## 2021-01-07 ENCOUNTER — Other Ambulatory Visit: Payer: Self-pay | Admitting: *Deleted

## 2021-01-07 ENCOUNTER — Encounter: Payer: Self-pay | Admitting: *Deleted

## 2021-01-07 ENCOUNTER — Other Ambulatory Visit (HOSPITAL_COMMUNITY): Payer: Self-pay

## 2021-01-07 ENCOUNTER — Ambulatory Visit: Payer: Medicaid Other | Admitting: *Deleted

## 2021-01-07 ENCOUNTER — Other Ambulatory Visit (HOSPITAL_COMMUNITY)
Admission: RE | Admit: 2021-01-07 | Discharge: 2021-01-07 | Disposition: A | Discharge status: Home / Self Care | Payer: Medicaid Other | Source: Ambulatory Visit | Attending: Obstetrics and Gynecology | Admitting: Obstetrics and Gynecology

## 2021-01-07 ENCOUNTER — Other Ambulatory Visit: Payer: Self-pay

## 2021-01-07 VITALS — BP 104/68 | HR 99 | Wt 208.0 lb

## 2021-01-07 VITALS — BP 128/80 | HR 105

## 2021-01-07 DIAGNOSIS — O10913 Unspecified pre-existing hypertension complicating pregnancy, third trimester: Secondary | ICD-10-CM

## 2021-01-07 DIAGNOSIS — M329 Systemic lupus erythematosus, unspecified: Secondary | ICD-10-CM | POA: Insufficient documentation

## 2021-01-07 DIAGNOSIS — O09523 Supervision of elderly multigravida, third trimester: Secondary | ICD-10-CM | POA: Diagnosis not present

## 2021-01-07 DIAGNOSIS — O99213 Obesity complicating pregnancy, third trimester: Secondary | ICD-10-CM

## 2021-01-07 DIAGNOSIS — E669 Obesity, unspecified: Secondary | ICD-10-CM

## 2021-01-07 DIAGNOSIS — O0990 Supervision of high risk pregnancy, unspecified, unspecified trimester: Secondary | ICD-10-CM | POA: Insufficient documentation

## 2021-01-07 DIAGNOSIS — Z3A35 35 weeks gestation of pregnancy: Secondary | ICD-10-CM

## 2021-01-07 DIAGNOSIS — O10919 Unspecified pre-existing hypertension complicating pregnancy, unspecified trimester: Secondary | ICD-10-CM

## 2021-01-07 DIAGNOSIS — O99019 Anemia complicating pregnancy, unspecified trimester: Secondary | ICD-10-CM

## 2021-01-07 DIAGNOSIS — O10013 Pre-existing essential hypertension complicating pregnancy, third trimester: Secondary | ICD-10-CM | POA: Diagnosis not present

## 2021-01-07 DIAGNOSIS — D573 Sickle-cell trait: Secondary | ICD-10-CM

## 2021-01-07 DIAGNOSIS — O99613 Diseases of the digestive system complicating pregnancy, third trimester: Secondary | ICD-10-CM

## 2021-01-07 DIAGNOSIS — K219 Gastro-esophageal reflux disease without esophagitis: Secondary | ICD-10-CM | POA: Insufficient documentation

## 2021-01-07 MED ORDER — PANTOPRAZOLE SODIUM 40 MG PO TBEC
40.0000 mg | DELAYED_RELEASE_TABLET | Freq: Every day | ORAL | 1 refills | Status: DC
Start: 2021-01-07 — End: 2021-02-01
  Filled 2021-01-07: qty 30, 30d supply, fill #0

## 2021-01-07 MED ORDER — PRENATAL 27-1 MG PO TABS
1.0000 | ORAL_TABLET | Freq: Every day | ORAL | 4 refills | Status: AC
  Filled 2021-01-07: qty 30, 30d supply, fill #0

## 2021-01-07 NOTE — Progress Notes (Signed)
   PRENATAL VISIT NOTE  Subjective:  Natalie Chase is a 40 y.o. [phone] at [redacted]w[redacted]d being seen today for ongoing prenatal care.  She is currently monitored for the following issues for this high-risk pregnancy and has Hypertension; Chronic hypertension affecting pregnancy; Sickle cell trait in mother affecting pregnancy (HCC); Lupus (systemic lupus erythematosus) (HCC); Supervision of high risk pregnancy, antepartum; [redacted] weeks gestation of pregnancy; Advanced maternal age in multigravida, third trimester; [redacted] weeks gestation of pregnancy; Vaginal discharge during pregnancy in second trimester; [redacted] weeks gestation of pregnancy; [redacted] weeks gestation of pregnancy; and Gastroesophageal reflux during pregnancy in third trimester, antepartum on their problem list.  Patient doing well with no acute concerns today. She reports  reflux and pelvic pressure .  Contractions: Irregular. Vag. Bleeding: None.  Movement: Present. Denies leaking of fluid.   The following portions of the patient's history were reviewed and updated as appropriate: allergies, current medications, past family history, past medical history, past social history, past surgical history and problem list. Problem list updated.  Objective:   Vitals:   01/07/21 0859  BP: 104/68  Pulse: 99  Weight: 208 lb (94.3 kg)    Fetal Status: Fetal Heart Rate (bpm): 150 Fundal Height: 36 cm Movement: Present     General:  Alert, oriented and cooperative. Patient is in no acute distress.  Skin: Skin is warm and dry. No rash noted.   Cardiovascular: Normal heart rate noted  Respiratory: Normal respiratory effort, no problems with respiration noted  Abdomen: Soft, gravid, appropriate for gestational age.  Pain/Pressure: Present     Pelvic: Cervical exam deferred Dilation: Closed Effacement (%): 40 Station: -3  Extremities: Normal range of motion.  Edema: None  Mental Status:  Normal mood and affect. Normal behavior. Normal judgment and thought content.    Assessment and Plan:  Pregnancy: [id number] at [redacted]w[redacted]d  1. [redacted] weeks gestation of pregnancy   2. Chronic hypertension affecting pregnancy Excellent blood pressure control  3. Supervision of high risk pregnancy, antepartum Continue routine care, weekly BPP/NST - Strep Gp B NAA - GC/Chlamydia probe amp ()not at [facility] - Prenatal Vit-Fe Fumarate-FA (PRENATAL PLUS VITAMIN/MINERAL) 27-1 MG TABS; Take 1 tablet by mouth daily.  Dispense: 30 tablet; Refill: 4  4. Sickle cell trait in mother affecting pregnancy (HCC)   5. Systemic lupus erythematosus, unspecified SLE type, unspecified organ involvement status (HCC) No involvement in pregnancy currently  6. Advanced maternal age in multigravida, third trimester   7. Gastroesophageal reflux during pregnancy in third trimester, antepartum Rx for proton pump inhnibitor - pantoprazole (PROTONIX) 40 MG tablet; Take 1 tablet (40 mg total) by mouth daily.  Dispense: 30 tablet; Refill: 1  Preterm labor symptoms and general obstetric precautions including but not limited to vaginal bleeding, contractions, leaking of fluid and fetal movement were reviewed in detail with the patient.  Please refer to After Visit Summary for other counseling recommendations.   Return in about 1 week (around 01/14/2021) for [facility], in person.   Mariel Aloe, MD Faculty Attending Center for [facility]

## 2021-01-07 NOTE — Progress Notes (Addendum)
ROB c/o pelvic pain/pressure, SOB

## 2021-01-09 LAB — STREP GP B NAA: Strep Gp B NAA: NEGATIVE

## 2021-01-10 LAB — GC/CHLAMYDIA PROBE AMP (~~LOC~~) NOT AT ARMC
Chlamydia: NEGATIVE
Comment: NEGATIVE
Comment: NORMAL
Neisseria Gonorrhea: NEGATIVE

## 2021-01-13 ENCOUNTER — Other Ambulatory Visit: Payer: Self-pay

## 2021-01-13 ENCOUNTER — Telehealth (INDEPENDENT_AMBULATORY_CARE_PROVIDER_SITE_OTHER): Payer: Medicaid Other | Admitting: Obstetrics and Gynecology

## 2021-01-13 ENCOUNTER — Encounter: Payer: Self-pay | Admitting: Obstetrics and Gynecology

## 2021-01-13 DIAGNOSIS — O10913 Unspecified pre-existing hypertension complicating pregnancy, third trimester: Secondary | ICD-10-CM

## 2021-01-13 DIAGNOSIS — O99891 Other specified diseases and conditions complicating pregnancy: Secondary | ICD-10-CM

## 2021-01-13 DIAGNOSIS — O0990 Supervision of high risk pregnancy, unspecified, unspecified trimester: Secondary | ICD-10-CM

## 2021-01-13 DIAGNOSIS — Z3A36 36 weeks gestation of pregnancy: Secondary | ICD-10-CM

## 2021-01-13 DIAGNOSIS — M329 Systemic lupus erythematosus, unspecified: Secondary | ICD-10-CM

## 2021-01-13 DIAGNOSIS — O09523 Supervision of elderly multigravida, third trimester: Secondary | ICD-10-CM

## 2021-01-13 DIAGNOSIS — O10919 Unspecified pre-existing hypertension complicating pregnancy, unspecified trimester: Secondary | ICD-10-CM

## 2021-01-13 NOTE — Progress Notes (Signed)
I connected with  Natalie Chase on 01/13/21 by a video enabled telemedicine application and verified that I am speaking with the correct person using two identifiers.   I discussed the limitations of evaluation and management by telemedicine. The patient expressed understanding and agreed to proceed.   Mychart HROB,  wants to know if it ok for her to fly and if she can get a letter to show the [employer].

## 2021-01-13 NOTE — Progress Notes (Signed)
   OBSTETRICS PRENATAL VIRTUAL VISIT ENCOUNTER NOTE  [facility]   Patient location: Home  I connected with Natalie Chase on 01/13/21 at 11:15 AM EST by MyChart Video Encounter and verified that I am speaking with the correct person using two identifiers. I discussed the limitations, risks, security and privacy concerns of performing an evaluation and management service virtually and the availability of in person appointments. I also discussed with the patient that there may be a patient responsible charge related to this service. The patient expressed understanding and agreed to proceed. Subjective:  Natalie Chase is a 40 y.o. [phone] at [redacted]w[redacted]d being seen today for ongoing prenatal care.  She is currently monitored for the following issues for this high-risk pregnancy and has Hypertension; Chronic hypertension affecting pregnancy; Sickle cell trait in mother affecting pregnancy (HCC); Lupus (systemic lupus erythematosus) (HCC); Supervision of high risk pregnancy, antepartum; [redacted] weeks gestation of pregnancy; Advanced maternal age in multigravida, third trimester; [redacted] weeks gestation of pregnancy; Vaginal discharge during pregnancy in second trimester; [redacted] weeks gestation of pregnancy; [redacted] weeks gestation of pregnancy; and Gastroesophageal reflux during pregnancy in third trimester, antepartum on their problem list.  Patient reports occasional contractions.  Contractions: Irregular. Vag. Bleeding: None.  Movement: Present. Denies any leaking of fluid.   The following portions of the patient's history were reviewed and updated as appropriate: allergies, current medications, past family history, past medical history, past social history, past surgical history and problem list.   Objective:  There were no vitals filed for this visit.  Fetal Status:     Movement: Present     General:  Alert, oriented and cooperative. Patient is in no acute distress.   Respiratory: Normal respiratory effort, no problems with respiration noted  Mental Status: Normal mood and affect. Normal behavior. Normal judgment and thought content.  Rest of physical exam deferred due to type of encounter  Imaging:  Assessment and Plan:  Pregnancy: [id number] at [redacted]w[redacted]d  1. Supervision of high risk pregnancy, antepartum Letter provided stating EDD for airline Plan for induction 38-39 weeks  2. Chronic hypertension affecting pregnancy Well controlled previously  Could not get BP cuff to work today Advised to take tomorrow and send [place] via Mychart if elevated  3. Advanced maternal age in multigravida, third trimester  4. Systemic lupus erythematosus, unspecified SLE type, unspecified organ involvement status (HCC) No issues  Preterm labor symptoms and general obstetric precautions including but not limited to vaginal bleeding, contractions, leaking of fluid and fetal movement were reviewed in detail with the patient. I discussed the assessment and treatment plan with the patient. The patient was provided an opportunity to ask questions and all were answered. The patient agreed with the plan and demonstrated an understanding of the instructions. The patient was advised to call back or seek an in-person office evaluation/go to [facility]U at [facility] for any urgent or concerning symptoms. Please refer to After Visit Summary for other counseling recommendations.   I provided 15 minutes of face-to-face time during this encounter.  Return in about 1 week (around 01/20/2021) for high OB, in person.  Future Appointments  Date Time Provider Department Center  01/18/2021  1:45 PM [facility] NURSE [facility]  01/18/2021  2:00 PM [facility] US7 [facility] [place]  01/28/2021 12:30 PM [facility] NURSE [facility]  01/28/2021 12:45 PM [facility] US5 [facility]    Conan Bowens, MD [facility], [facility]

## 2021-01-14 ENCOUNTER — Ambulatory Visit: Payer: Medicaid Other

## 2021-01-17 ENCOUNTER — Telehealth: Payer: Self-pay

## 2021-01-17 NOTE — Telephone Encounter (Signed)
LM for patient-(requesting 230p arrival on 11/22 (instead of 145p).

## 2021-01-18 ENCOUNTER — Other Ambulatory Visit: Payer: Self-pay

## 2021-01-18 ENCOUNTER — Ambulatory Visit: Payer: Medicaid Other

## 2021-01-26 ENCOUNTER — Other Ambulatory Visit: Payer: Self-pay

## 2021-01-26 ENCOUNTER — Ambulatory Visit: Payer: Medicaid Other

## 2021-01-27 ENCOUNTER — Ambulatory Visit (INDEPENDENT_AMBULATORY_CARE_PROVIDER_SITE_OTHER): Payer: Medicaid Other | Admitting: Obstetrics and Gynecology

## 2021-01-27 ENCOUNTER — Encounter: Payer: Self-pay | Admitting: Obstetrics and Gynecology

## 2021-01-27 ENCOUNTER — Other Ambulatory Visit: Payer: Self-pay

## 2021-01-27 VITALS — BP 139/78 | HR 114 | Wt 210.0 lb

## 2021-01-27 DIAGNOSIS — O0990 Supervision of high risk pregnancy, unspecified, unspecified trimester: Secondary | ICD-10-CM

## 2021-01-27 DIAGNOSIS — O10919 Unspecified pre-existing hypertension complicating pregnancy, unspecified trimester: Secondary | ICD-10-CM

## 2021-01-27 DIAGNOSIS — M329 Systemic lupus erythematosus, unspecified: Secondary | ICD-10-CM

## 2021-01-27 DIAGNOSIS — O09523 Supervision of elderly multigravida, third trimester: Secondary | ICD-10-CM

## 2021-01-27 NOTE — Progress Notes (Signed)
   PRENATAL VISIT NOTE  Subjective:  Natalie Chase is a 40 y.o. [id number] at [redacted]w[redacted]d being seen today for ongoing prenatal care.  She is currently monitored for the following issues for this high-risk pregnancy and has Hypertension; Chronic hypertension affecting pregnancy; Sickle cell trait in mother affecting pregnancy (HCC); Lupus (systemic lupus erythematosus) (HCC); Supervision of high risk pregnancy, antepartum; Advanced maternal age in multigravida, third trimester; and Gastroesophageal reflux during pregnancy in third trimester, antepartum on their problem list.  Patient reports no complaints.  Contractions: Irregular. Vag. Bleeding: None.  Movement: Present. Denies leaking of fluid.   The following portions of the patient's history were reviewed and updated as appropriate: allergies, current medications, past family history, past medical history, past social history, past surgical history and problem list.   Objective:   Vitals:   01/27/21 0950  BP: 139/78  Pulse: (!) 114  Weight: 210 lb (95.3 kg)    Fetal Status: Fetal Heart Rate (bpm): 154 Fundal Height: 39 cm Movement: Present  Presentation: Vertex  General:  Alert, oriented and cooperative. Patient is in no acute distress.  Skin: Skin is warm and dry. No rash noted.   Cardiovascular: Normal heart rate noted  Respiratory: Normal respiratory effort, no problems with respiration noted  Abdomen: Soft, gravid, appropriate for gestational age.  Pain/Pressure: Present     Pelvic: Cervical exam performed in the presence of a chaperone Dilation: 1.5 Effacement (%): 40 Station: -3  Extremities: Normal range of motion.  Edema: Trace  Mental Status: Normal mood and affect. Normal behavior. Normal judgment and thought content.   Assessment and Plan:  Pregnancy: [id number] at [redacted]w[redacted]d 1. Supervision of high risk pregnancy, antepartum Patient is doing well without complaints  2. Chronic hypertension affecting pregnancy BP stable on  nifedipine Follow up BPP tomorrow Plan for IOL at 39 weeks  3. Systemic lupus erythematosus, unspecified SLE type, unspecified organ involvement status (HCC) Stable without any recent flares  4. Advanced maternal age in multigravida, third trimester   Term labor symptoms and general obstetric precautions including but not limited to vaginal bleeding, contractions, leaking of fluid and fetal movement were reviewed in detail with the patient. Please refer to After Visit Summary for other counseling recommendations.   Return for in person, ROB, High risk.  Future Appointments  Date Time Provider Department Center  01/28/2021 12:30 PM [facility] NURSE [facility]  01/28/2021 12:45 PM [facility] US5 [facility]    Catalina Antigua, MD

## 2021-01-27 NOTE — Progress Notes (Signed)
ROB 38.4wks  Reports headaches, not taking tylenol because of concerns reported on the news about tylenol in pregnancy and its relation to autism spectrum and ADHD.  Went to hospital in [place] over holiday for concerns with decreased FM. Extended monitoring. US/ anterior placenta. Discharged home.

## 2021-01-28 ENCOUNTER — Ambulatory Visit (HOSPITAL_BASED_OUTPATIENT_CLINIC_OR_DEPARTMENT_OTHER): Payer: Medicaid Other

## 2021-01-28 ENCOUNTER — Other Ambulatory Visit: Payer: Self-pay

## 2021-01-28 ENCOUNTER — Inpatient Hospital Stay (HOSPITAL_COMMUNITY)
Admission: AD | Admit: 2021-01-28 | Discharge: 2021-02-01 | DRG: 806 | Disposition: A | Discharge status: Home / Self Care | Payer: Medicaid Other | Attending: Family Medicine | Admitting: Family Medicine

## 2021-01-28 ENCOUNTER — Other Ambulatory Visit: Payer: Self-pay | Admitting: Maternal & Fetal Medicine

## 2021-01-28 ENCOUNTER — Ambulatory Visit: Payer: Medicaid Other | Admitting: *Deleted

## 2021-01-28 ENCOUNTER — Encounter (HOSPITAL_COMMUNITY): Payer: Self-pay | Admitting: Obstetrics and Gynecology

## 2021-01-28 VITALS — BP 138/90 | HR 122

## 2021-01-28 VITALS — BP 143/78 | HR 121

## 2021-01-28 DIAGNOSIS — O904 Postpartum acute kidney failure: Secondary | ICD-10-CM | POA: Diagnosis present

## 2021-01-28 DIAGNOSIS — O09523 Supervision of elderly multigravida, third trimester: Secondary | ICD-10-CM

## 2021-01-28 DIAGNOSIS — O99019 Anemia complicating pregnancy, unspecified trimester: Secondary | ICD-10-CM | POA: Diagnosis present

## 2021-01-28 DIAGNOSIS — O114 Pre-existing hypertension with pre-eclampsia, complicating childbirth: Principal | ICD-10-CM | POA: Diagnosis present

## 2021-01-28 DIAGNOSIS — O10913 Unspecified pre-existing hypertension complicating pregnancy, third trimester: Secondary | ICD-10-CM | POA: Insufficient documentation

## 2021-01-28 DIAGNOSIS — O99891 Other specified diseases and conditions complicating pregnancy: Secondary | ICD-10-CM

## 2021-01-28 DIAGNOSIS — O9952 Diseases of the respiratory system complicating childbirth: Secondary | ICD-10-CM | POA: Diagnosis present

## 2021-01-28 DIAGNOSIS — M329 Systemic lupus erythematosus, unspecified: Secondary | ICD-10-CM | POA: Diagnosis present

## 2021-01-28 DIAGNOSIS — Z20822 Contact with and (suspected) exposure to covid-19: Secondary | ICD-10-CM | POA: Diagnosis present

## 2021-01-28 DIAGNOSIS — Z3A38 38 weeks gestation of pregnancy: Secondary | ICD-10-CM | POA: Diagnosis not present

## 2021-01-28 DIAGNOSIS — O1002 Pre-existing essential hypertension complicating childbirth: Secondary | ICD-10-CM | POA: Diagnosis present

## 2021-01-28 DIAGNOSIS — D573 Sickle-cell trait: Secondary | ICD-10-CM | POA: Diagnosis present

## 2021-01-28 DIAGNOSIS — O283 Abnormal ultrasonic finding on antenatal screening of mother: Secondary | ICD-10-CM | POA: Insufficient documentation

## 2021-01-28 DIAGNOSIS — O1490 Unspecified pre-eclampsia, unspecified trimester: Secondary | ICD-10-CM | POA: Diagnosis not present

## 2021-01-28 DIAGNOSIS — Z87891 Personal history of nicotine dependence: Secondary | ICD-10-CM | POA: Diagnosis not present

## 2021-01-28 DIAGNOSIS — O0990 Supervision of high risk pregnancy, unspecified, unspecified trimester: Secondary | ICD-10-CM

## 2021-01-28 DIAGNOSIS — O9902 Anemia complicating childbirth: Secondary | ICD-10-CM | POA: Diagnosis present

## 2021-01-28 DIAGNOSIS — J45909 Unspecified asthma, uncomplicated: Secondary | ICD-10-CM | POA: Diagnosis present

## 2021-01-28 DIAGNOSIS — O1413 Severe pre-eclampsia, third trimester: Secondary | ICD-10-CM

## 2021-01-28 DIAGNOSIS — O10919 Unspecified pre-existing hypertension complicating pregnancy, unspecified trimester: Secondary | ICD-10-CM

## 2021-01-28 DIAGNOSIS — N179 Acute kidney failure, unspecified: Secondary | ICD-10-CM | POA: Diagnosis present

## 2021-01-28 DIAGNOSIS — O1414 Severe pre-eclampsia complicating childbirth: Secondary | ICD-10-CM | POA: Diagnosis not present

## 2021-01-28 DIAGNOSIS — O99892 Other specified diseases and conditions complicating childbirth: Secondary | ICD-10-CM | POA: Diagnosis present

## 2021-01-28 DIAGNOSIS — O10013 Pre-existing essential hypertension complicating pregnancy, third trimester: Secondary | ICD-10-CM | POA: Diagnosis not present

## 2021-01-28 LAB — COMPREHENSIVE METABOLIC PANEL
ALT: 20 U/L (ref 0–44)
AST: 22 U/L (ref 15–41)
Albumin: 2.9 g/dL — ABNORMAL LOW (ref 3.5–5.0)
Alkaline Phosphatase: 128 U/L — ABNORMAL HIGH (ref 38–126)
Anion gap: 10 (ref 5–15)
BUN: 12 mg/dL (ref 6–20)
CO2: 23 mmol/L (ref 22–32)
Calcium: 9.9 mg/dL (ref 8.9–10.3)
Chloride: 102 mmol/L (ref 98–111)
Creatinine, Ser: 1.06 mg/dL — ABNORMAL HIGH (ref 0.44–1.00)
GFR, Estimated: >60 mL/min (ref >60)
Glucose, Bld: 103 mg/dL — ABNORMAL HIGH (ref 70–99)
Potassium: 4.4 mmol/L (ref 3.5–5.1)
Sodium: 135 mmol/L (ref 135–145)
Total Bilirubin: 0.4 mg/dL (ref 0.3–1.2)
Total Protein: 7.1 g/dL (ref 6.5–8.1)

## 2021-01-28 LAB — CBC WITH DIFFERENTIAL/PLATELET
Abs Immature Granulocytes: 0.21 10*3/uL — ABNORMAL HIGH (ref 0.00–0.07)
Basophils Absolute: 0.1 10*3/uL (ref 0.0–0.1)
Basophils Relative: 1 %
Eosinophils Absolute: 0.1 10*3/uL (ref 0.0–0.5)
Eosinophils Relative: 1 %
HCT: 40 % (ref 36.0–46.0)
Hemoglobin: 13.3 g/dL (ref 12.0–15.0)
Immature Granulocytes: 1 %
Lymphocytes Relative: 15 %
Lymphs Abs: 2.4 10*3/uL (ref 0.7–4.0)
MCH: 26.3 pg (ref 26.0–34.0)
MCHC: 33.3 g/dL (ref 30.0–36.0)
MCV: 79.1 fL — ABNORMAL LOW (ref 80.0–100.0)
Monocytes Absolute: 1.4 10*3/uL — ABNORMAL HIGH (ref 0.1–1.0)
Monocytes Relative: 9 %
Neutro Abs: 11.1 10*3/uL — ABNORMAL HIGH (ref 1.7–7.7)
Neutrophils Relative %: 73 %
Platelets: 350 10*3/uL (ref 150–400)
RBC: 5.06 MIL/uL (ref 3.87–5.11)
RDW: 16.7 % — ABNORMAL HIGH (ref 11.5–15.5)
WBC: 15.2 10*3/uL — ABNORMAL HIGH (ref 4.0–10.5)
nRBC: 0 % (ref 0.0–0.2)

## 2021-01-28 LAB — PROTEIN / CREATININE RATIO, URINE
Creatinine, Urine: 68.63 mg/dL
Protein Creatinine Ratio: 0.1 mg/mg{Cre} (ref 0.00–0.15)
Total Protein, Urine: 7 mg/dL

## 2021-01-28 LAB — RESP PANEL BY RT-PCR (FLU A&B, COVID) ARPGX2
Influenza A by PCR: NEGATIVE
Influenza B by PCR: NEGATIVE
SARS Coronavirus 2 by RT PCR: NEGATIVE

## 2021-01-28 LAB — TYPE AND SCREEN
ABO/RH(D): O POS
Antibody Screen: NEGATIVE

## 2021-01-28 MED ORDER — SERTRALINE HCL 50 MG PO TABS
50.0000 mg | ORAL_TABLET | Freq: Every day | ORAL | Status: DC
  Administered 2021-01-29 – 2021-02-01 (×4): 50 mg via ORAL
  Filled 2021-01-28 (×4): qty 1

## 2021-01-28 MED ORDER — MAGNESIUM SULFATE 40 GM/1000ML IV SOLN
1.0000 g/h | INTRAVENOUS | Status: DC
  Administered 2021-01-28: 2 g/h via INTRAVENOUS
  Filled 2021-01-28 (×2): qty 1000

## 2021-01-28 MED ORDER — OXYTOCIN-SODIUM CHLORIDE 30-0.9 UT/500ML-% IV SOLN: 2.5000 [IU]/h | INTRAVENOUS | Status: DC

## 2021-01-28 MED ORDER — OXYTOCIN BOLUS FROM INFUSION
333.0000 mL | Freq: Once | INTRAVENOUS | Status: AC
  Administered 2021-01-29: 333 mL via INTRAVENOUS

## 2021-01-28 MED ORDER — NIFEDIPINE ER OSMOTIC RELEASE 60 MG PO TB24
60.0000 mg | ORAL_TABLET | Freq: Every day | ORAL | Status: DC
  Administered 2021-01-29 – 2021-02-01 (×4): 60 mg via ORAL
  Filled 2021-01-28 (×6): qty 1

## 2021-01-28 MED ORDER — ACETAMINOPHEN 325 MG PO TABS: 650.0000 mg | ORAL_TABLET | ORAL | Status: DC | PRN

## 2021-01-28 MED ORDER — MAGNESIUM SULFATE BOLUS VIA INFUSION
4.0000 g | Freq: Once | INTRAVENOUS | Status: AC
  Administered 2021-01-28: 4 g via INTRAVENOUS
  Filled 2021-01-28: qty 1000

## 2021-01-28 MED ORDER — MISOPROSTOL 50MCG HALF TABLET
50.0000 ug | ORAL_TABLET | ORAL | Status: DC | PRN
  Administered 2021-01-28 – 2021-01-29 (×2): 50 ug via BUCCAL
  Filled 2021-01-28 (×2): qty 1

## 2021-01-28 MED ORDER — OXYCODONE-ACETAMINOPHEN 5-325 MG PO TABS: 2.0000 | ORAL_TABLET | ORAL | Status: DC | PRN

## 2021-01-28 MED ORDER — OXYCODONE-ACETAMINOPHEN 5-325 MG PO TABS: 1.0000 | ORAL_TABLET | ORAL | Status: DC | PRN

## 2021-01-28 MED ORDER — ONDANSETRON HCL 4 MG/2ML IJ SOLN
4.0000 mg | Freq: Four times a day (QID) | INTRAMUSCULAR | Status: DC | PRN
  Administered 2021-01-29: 4 mg via INTRAVENOUS
  Filled 2021-01-28: qty 2

## 2021-01-28 MED ORDER — LACTATED RINGERS IV SOLN: INTRAVENOUS | Status: DC

## 2021-01-28 MED ORDER — TERBUTALINE SULFATE 1 MG/ML IJ SOLN: 0.2500 mg | Freq: Once | INTRAMUSCULAR | Status: DC | PRN

## 2021-01-28 MED ORDER — LIDOCAINE HCL (PF) 1 % IJ SOLN: 30.0000 mL | INTRAMUSCULAR | Status: DC | PRN

## 2021-01-28 MED ORDER — ONDANSETRON HCL 4 MG/2ML IJ SOLN: 4.0000 mg | Freq: Four times a day (QID) | INTRAMUSCULAR | Status: DC | PRN

## 2021-01-28 MED ORDER — SOD CITRATE-CITRIC ACID 500-334 MG/5ML PO SOLN: 30.0000 mL | ORAL | Status: DC | PRN

## 2021-01-28 MED ORDER — LACTATED RINGERS IV SOLN: 500.0000 mL | INTRAVENOUS | Status: DC | PRN

## 2021-01-28 MED ORDER — OXYTOCIN BOLUS FROM INFUSION: 333.0000 mL | Freq: Once | INTRAVENOUS | Status: DC

## 2021-01-28 MED ORDER — LACTATED RINGERS IV SOLN
500.0000 mL | INTRAVENOUS | Status: DC | PRN
  Administered 2021-01-29: 500 mL via INTRAVENOUS

## 2021-01-28 NOTE — Progress Notes (Signed)
Pt c/o h/a, visual disturbances and epigastric pain for the past 1 1/2 weeks.

## 2021-01-28 NOTE — H&P (Addendum)
OBSTETRIC ADMISSION HISTORY AND PHYSICAL  Natalie Chase is a 40 y.o. female [phone] with IUP at [redacted]w[redacted]d by 10w [place] presenting for IOL for cHTN. She reports +FMs, No LOF, no VB, no blurry vision, headaches or peripheral edema, and RUQ pain.  She plans on breast feeding. She request POP's for birth control. She received her prenatal care at [facility].  Dating: By 10w [place] --->  Estimated Date of Delivery: 02/06/21  Sono:    @[redacted]w[redacted]d , CWD, normal anatomy, cephalic presentation, anterior left lateral lie, 326g, 37% EFW  Today (MFM): EFW 83%, cephalic, nl fluid, BPP 8/8   Prenatal History/Complications:  Chronic hypertension Sickle Cell Trait Systemic Lupus Erythematosus (Rheumatologist at [facility], no meds currently, was on Plaquenil with preg prev) Advanced Maternal Age   Past Medical History: Past Medical History:  Diagnosis Date   Asthma    Hypertension    Lupus (systemic lupus erythematosus) (HCC)    Pre-diabetes 05/2020   Sickle cell trait (HCC)    Vaginal Pap smear, abnormal     Past Surgical History: Past Surgical History:  Procedure Laterality Date   CHOLECYSTECTOMY      Obstetrical History: OB History     Gravida  4   Para  2   Term  2   Preterm      AB  1   Living  2      SAB      IAB  1   Ectopic      Multiple  0   Live Births  2           Social History Social History   Socioeconomic History   Marital status: Single    Spouse name: Not on file   Number of children: 2   Years of education: High School   Highest education level: Some college, no degree  Occupational History   Not on file  Tobacco Use   Smoking status: Former    Packs/day: 0.00    Types: Cigarettes    Quit date: 01/09/2017    Years since quitting: 4.0   Smokeless tobacco: Former  01/11/2017 Use: Never used  Substance and Sexual Activity   Alcohol use: Not Currently   Drug use: Not Currently    Types: Marijuana    Comment: last used last week   Sexual  activity: Yes    Partners: Male    Birth control/protection: None  Other Topics Concern   Not on file  Social History Narrative   Not on file   Social Determinants of Health   Financial Resource Strain: Not on file  Food Insecurity: Not on file  Transportation Needs: Not on file  Physical Activity: Not on file  Stress: Not on file  Social Connections: Not on file    Family History: Family History  Problem Relation Age of Onset   Achondroplasia Mother    Lupus Mother    Sickle cell anemia Mother    Hypertension Father    Stroke Father    Cancer Other    Heart disease Maternal Grandmother     Allergies: No Known Allergies  Medications Prior to Admission  Medication Sig Dispense Refill Last Dose   aspirin 81 MG EC tablet Take 1 tablet (81 mg total) by mouth daily. Take after 12 weeks for prevention of preeclampsia later in pregnancy. 300 tablet 2 01/28/2021   NIFEdipine (ADALAT CC) 60 MG 24 hr tablet Take 1 tablet (60 mg total) by mouth  daily. 30 tablet 5 01/28/2021   pantoprazole (PROTONIX) 40 MG tablet Take 1 tablet (40 mg total) by mouth daily. 30 tablet 1 01/28/2021   Prenatal 27-1 MG TABS Take 1 tablet by mouth daily. 30 tablet 4 01/28/2021   sertraline (ZOLOFT) 50 MG tablet Take 1 tablet (50 mg total) by mouth daily. 30 tablet 6 01/28/2021   albuterol (VENTOLIN HFA) 108 (90 Base) MCG/ACT inhaler Inhale 1-2 puffs into the lungs every 6 (six) hours as needed for wheezing or shortness of breath. 18 g 1 More than a month     Review of Systems   All systems reviewed and negative except as stated in HPI  Blood pressure 140/89, pulse (!) 113, temperature 98.8 F (37.1 C), temperature source Oral, resp. rate 17, last menstrual period 05/08/2020, not currently breastfeeding. General appearance: alert, cooperative, and no distress Lungs: no increased work of breathing Heart: warm and well perfused Abdomen: soft, non-tender; gravid uterus Extremities: no edema or calf  tenderness Presentation: cephalic Fetal monitoring: 150 bpm/ moderate/ +accels, no decels Uterine activity: irregular     Prenatal labs: ABO, Rh: O/Positive/-- (05/24 1433) Antibody: Negative (05/24 1433) Rubella: 11.20 (05/24 1433) RPR: Non Reactive (09/14 1016)  HBsAg: Negative (05/24 1433)  HIV: Non Reactive (09/14 1016)  GBS: Negative/-- (11/11 1007)  2 hr Glucola: Normal Genetic screening: LR NIPS, Sickle cell trait ([place]) Anatomy US: Normal  Prenatal Transfer Tool  Maternal Diabetes: No Genetic Screening: Sickle Cell trait Maternal Ultrasounds/Referrals: Normal Fetal Ultrasounds or other Referrals:  None Maternal Substance Abuse:  No Significant Maternal Medications:  Zoloft 50mg , Nifedipine 60 mg  Significant Maternal Lab Results: Group B Strep negative  Results for orders placed or performed during the hospital encounter of 01/28/21 (from the past 24 hour(s))  CBC with Differential/Platelet   Collection Time: 01/28/21  3:54 PM  Result Value Ref Range   WBC 15.2 (H) 4.0 - 10.5 K/uL   RBC 5.06 3.87 - 5.11 MIL/uL   Hemoglobin 13.3 12.0 - 15.0 g/dL   HCT 14/02/22 71.0 - 62.6 %   MCV 79.1 (L) 80.0 - 100.0 fL   MCH 26.3 26.0 - 34.0 pg   MCHC 33.3 30.0 - 36.0 g/dL   RDW 94.8 (H) 54.6 - 27.0 %   Platelets 350 150 - 400 K/uL   nRBC 0.0 0.0 - 0.2 %   Neutrophils Relative % 73 %   Neutro Abs 11.1 (H) 1.7 - 7.7 K/uL   Lymphocytes Relative 15 %   Lymphs Abs 2.4 0.7 - 4.0 K/uL   Monocytes Relative 9 %   Monocytes Absolute 1.4 (H) 0.1 - 1.0 K/uL   Eosinophils Relative 1 %   Eosinophils Absolute 0.1 0.0 - 0.5 K/uL   Basophils Relative 1 %   Basophils Absolute 0.1 0.0 - 0.1 K/uL   Immature Granulocytes 1 %   Abs Immature Granulocytes 0.21 (H) 0.00 - 0.07 K/uL  Comprehensive metabolic panel   Collection Time: 01/28/21  3:54 PM  Result Value Ref Range   Sodium 135 135 - 145 mmol/L   Potassium 4.4 3.5 - 5.1 mmol/L   Chloride 102 98 - 111 mmol/L   CO2 23 22 - 32 mmol/L    Glucose, Bld 103 (H) 70 - 99 mg/dL   BUN 12 6 - 20 mg/dL   Creatinine, Ser 14/02/22 (H) 0.44 - 1.00 mg/dL   Calcium 9.9 8.9 - 0.93 mg/dL   Total Protein 7.1 6.5 - 8.1 g/dL   Albumin 2.9 (L) 3.5 -  5.0 g/dL   AST 22 15 - 41 U/L   ALT 20 0 - 44 U/L   Alkaline Phosphatase 128 (H) 38 - 126 U/L   Total Bilirubin 0.4 0.3 - 1.2 mg/dL   GFR, Estimated >33 >35 mL/min   Anion gap 10 5 - 15  Protein / creatinine ratio, urine   Collection Time: 01/28/21  4:20 PM  Result Value Ref Range   Creatinine, Urine 68.63 mg/dL   Total Protein, Urine 7 mg/dL   Protein Creatinine Ratio 0.10 0.00 - 0.15 mg/mg[Cre]    Patient Active Problem List   Diagnosis Date Noted   Gastroesophageal reflux during pregnancy in third trimester, antepartum 01/07/2021   Advanced maternal age in multigravida, third trimester 10/13/2020   Supervision of high risk pregnancy, antepartum 07/12/2020   Lupus (systemic lupus erythematosus) (HCC)    Sickle cell trait in mother affecting pregnancy (HCC) 11/14/2018   Chronic hypertension affecting pregnancy 10/29/2018   Hypertension 06/11/2017    Assessment/Plan:  Natalie Chase is a 41 y.o. [id number] at [redacted]w[redacted]d here for IOL for cHTN  #Labor: Admit to L&D for induction. #Pain: Will offer epidural. #FWB: /Cat 1 #ID:  GBS negative #MOF: breast #MOC: POPs  Angela Cox, MD  01/28/2021, 6:30 PM  CNM attestation:  I have seen and examined this patient; I agree with above documentation in the resident's note.   Natalie Chase is a 40 y.o. [phone] here for IOL due to worsening cHTN; asymptomatic for pre-e although she did have a H/A earlier today and reported 'floaters' and RUQ pain while in MFM today  PE: BP (!) 143/105   Pulse (!) 110   Temp 98.8 F (37.1 C) (Oral)   Resp 17   Ht 5' 4.5" (1.638 m)   Wt 95.4 kg   LMP 05/08/2020 (Approximate)   BMI 35.54 kg/m  Gen: calm comfortable, NAD Resp: normal effort, no distress Abd: gravid  ROS, labs, PMH reviewed Serum  creatinine 1.06  Plan: -Admit to Labor and Delivery -Cervical ripening with cytotec (discussed using cervical foley instead due to FHR variables in MAU, but reports a traumatic experience with foley placement in prev preg); then will progress to Pit/AROM prn -Continue home Procardia XL 60mg  -Watch creatinine> at this point is <1.1 so no dx of SIPE; is also currently asymptomatic for pre-e -Anticipate vag del  CNM 01/28/2021, 8:52 PM

## 2021-01-28 NOTE — Consult Note (Addendum)
MFM Note  Natalie Chase was seen for a follow up growth scan and BPP due to chronic hypertension treated with nifedipine and lupus.  The patient reports that she has been experiencing a severe headache, seeing floaters in front of her eyes,  and has epigastric pain for the past few days.  Her blood pressures in our office today were 144/77 and 138/90.   The overall EFW measures 8 pounds 5 ounces (83rd percentile).  There was normal amniotic fluid with a total AFI of 14.6 cm.   A BPP performed today was 6 out of 8.  She received a -2 for fetal breathing movements that did not meet criteria.  She subsequently had a reactive NST making her total biophysical profile score 8 out of 10.  The patient is already scheduled for delivery in 2 days.    However due to her complaints and her elevated blood pressures, she was sent to the hospital for delivery following today's ultrasound exam.    The patient should be started on magnesium sulfate should she be diagnosed with preeclampsia.  A total of 20 minutes was spent counseling and coordinating the care for this patient.  Greater than 50% of the time was spent in direct face-to-face contact.

## 2021-01-28 NOTE — Progress Notes (Unsigned)
MFM Note  Natalie Chase was seen for a follow up growth scan and BPP due to chronic hypertension treated with nifedipine and lupus.  The patient reports that she has been experiencing a severe headache, seeing floaters in front of her eyes,  and has epigastric pain for the past few days.  Her blood pressures in our office today were 144/77 and 138/90.  She was informed that the fetal growth and amniotic fluid level appears appropriate for her gestational age.  The overall EFW measures 8 pounds 5 ounces (83rd percentile).  There was normal amniotic fluid with a total AFI of 14.6 cm.   A BPP performed today was 6 out of 8.  She received a -2 for fetal breathing movements that did not meet criteria.  She subsequently had a reactive NST making her total biophysical profile score 8 out of 10.  The patient is already scheduled for delivery in 2 days.    However due to her complaints and her elevated blood pressures, she was sent to the hospital for delivery following today's ultrasound exam.    The patient should be started on magnesium sulfate should she be diagnosed with preeclampsia.  A total of 20 minutes was spent counseling and coordinating the care for this patient.  Greater than 50% of the time was spent in direct face-to-face contact.

## 2021-01-28 NOTE — MAU Note (Signed)
Had this constant HA for the last week and a half to almost 2 wks.(Has not taken anything for HA)  Increased swelling in feet and hands. Has had pain at time underneath rt breast (none currently). Checking BP at home, office yesterday and today, BP was elevated.

## 2021-01-28 NOTE — Progress Notes (Signed)
Dr. Parke Poisson aware of elevated BP's and symptoms.

## 2021-01-28 NOTE — Procedures (Signed)
Natalie Chase [birth year] [redacted]w[redacted]d  Fetus A Non-Stress Test Interpretation for 01/28/21  Indication: Unsatisfactory BPP, CHTN  Fetal Heart Rate A Mode: External Baseline Rate (A): 160 bpm Variability: Moderate Accelerations: 15 x 15 Decelerations: None Multiple birth?: No  Uterine Activity Mode: Palpation, Toco Contraction Frequency (min): none Resting Tone Palpated: Relaxed  Interpretation (Fetal Testing) Nonstress Test Interpretation: Reactive Overall Impression: Reassuring for gestational age Comments: Dr. Parke Poisson reviewed tracing

## 2021-01-28 NOTE — MAU Provider Note (Signed)
History     CSN: [id number]  Arrival date and time: 01/28/21 1512   Event Date/Time   First Provider Initiated Contact with Patient 01/28/21 1727      Chief Complaint  Patient presents with   Headache   Hypertension   Foot Swelling   Abdominal Pain   HPI  Ms.Natalie Chase is a 40 y.o. female [phone] @ [redacted]w[redacted]d  with a history of CHTN here with elevated BP, abdominal pain, floaters in vision and headache. She currently rates her HA 7/10. She has not tried tylenol due to the recent reports of taking tylenol while pregnant. She is not interested in trying tylenol for HA. She has tried drinking fluids and resting. In the last few days nothing has helped her Lamonte Sakai. She reports taking nifedipine 60 mg daily for her BP. The last time she saw floaters in her vision was today while at [facility]. She reports seeing spots in her vision.    OB History     Gravida  4   Para  2   Term  2   Preterm      AB  1   Living  2      SAB      IAB  1   Ectopic      Multiple  0   Live Births  2           Past Medical History:  Diagnosis Date   Asthma    Hypertension    Lupus (systemic lupus erythematosus) ([place])    Pre-diabetes 05/2020   Sickle cell trait ([place])    Vaginal Pap smear, abnormal     Past Surgical History:  Procedure Laterality Date   CHOLECYSTECTOMY      Family History  Problem Relation Age of Onset   Achondroplasia Mother    Lupus Mother    Sickle cell anemia Mother    Hypertension Father    Stroke Father    Cancer Other    Heart disease Maternal Grandmother     Social History   Tobacco Use   Smoking status: Former    Packs/day: 0.00    Types: Cigarettes    Quit date: 01/09/2017    Years since quitting: 4.0   Smokeless tobacco: Former  Scientific laboratory technician Use: Never used  Substance Use Topics   Alcohol use: Not Currently   Drug use: Not Currently    Types: Marijuana    Comment: last used last week    Allergies: No Known  Allergies  Medications Prior to Admission  Medication Sig Dispense Refill Last Dose   aspirin 81 MG EC tablet Take 1 tablet (81 mg total) by mouth daily. Take after 12 weeks for prevention of preeclampsia later in pregnancy. 300 tablet 2 01/28/2021   NIFEdipine (ADALAT CC) 60 MG 24 hr tablet Take 1 tablet (60 mg total) by mouth daily. 30 tablet 5 01/28/2021   pantoprazole (PROTONIX) 40 MG tablet Take 1 tablet (40 mg total) by mouth daily. 30 tablet 1 01/28/2021   Prenatal 27-1 MG TABS Take 1 tablet by mouth daily. 30 tablet 4 01/28/2021   sertraline (ZOLOFT) 50 MG tablet Take 1 tablet (50 mg total) by mouth daily. 30 tablet 6 01/28/2021   albuterol (VENTOLIN HFA) 108 (90 Base) MCG/ACT inhaler Inhale 1-2 puffs into the lungs every 6 (six) hours as needed for wheezing or shortness of breath. 18 g 1 More than a month   Results for orders placed or performed during  the hospital encounter of 01/28/21 (from the past 48 hour(s))  CBC with Differential/Platelet     Status: Abnormal   Collection Time: 01/28/21  3:54 PM  Result Value Ref Range   WBC 15.2 (H) 4.0 - 10.5 K/uL   RBC 5.06 3.87 - 5.11 MIL/uL   Hemoglobin 13.3 12.0 - 15.0 g/dL   HCT 34.1 93.7 - 90.2 %   MCV 79.1 (L) 80.0 - 100.0 fL   MCH 26.3 26.0 - 34.0 pg   MCHC 33.3 30.0 - 36.0 g/dL   RDW 40.9 (H) 73.5 - 32.9 %   Platelets 350 150 - 400 K/uL   nRBC 0.0 0.0 - 0.2 %   Neutrophils Relative % 73 %   Neutro Abs 11.1 (H) 1.7 - 7.7 K/uL   Lymphocytes Relative 15 %   Lymphs Abs 2.4 0.7 - 4.0 K/uL   Monocytes Relative 9 %   Monocytes Absolute 1.4 (H) 0.1 - 1.0 K/uL   Eosinophils Relative 1 %   Eosinophils Absolute 0.1 0.0 - 0.5 K/uL   Basophils Relative 1 %   Basophils Absolute 0.1 0.0 - 0.1 K/uL   Immature Granulocytes 1 %   Abs Immature Granulocytes 0.21 (H) 0.00 - 0.07 K/uL    Comment: Performed at [facility], [address], [place], [place] [address]  Comprehensive metabolic panel     Status: Abnormal   Collection Time:  01/28/21  3:54 PM  Result Value Ref Range   Sodium 135 135 - 145 mmol/L   Potassium 4.4 3.5 - 5.1 mmol/L   Chloride 102 98 - 111 mmol/L   CO2 23 22 - 32 mmol/L   Glucose, Bld 103 (H) 70 - 99 mg/dL    Comment: Glucose reference range applies only to samples taken after fasting for at least 8 hours.   BUN 12 6 - 20 mg/dL   Creatinine, Ser 8.34 (H) 0.44 - 1.00 mg/dL   Calcium 9.9 8.9 - 19.6 mg/dL   Total Protein 7.1 6.5 - 8.1 g/dL   Albumin 2.9 (L) 3.5 - 5.0 g/dL   AST 22 15 - 41 U/L   ALT 20 0 - 44 U/L   Alkaline Phosphatase 128 (H) 38 - 126 U/L   Total Bilirubin 0.4 0.3 - 1.2 mg/dL   GFR, Estimated >22 >29 mL/min    Comment: (NOTE) Calculated using the CKD-EPI Creatinine Equation (2021)    Anion gap 10 5 - 15    Comment: Performed at [facility], [address] [place], [place] [address]  Protein / creatinine ratio, urine     Status: None   Collection Time: 01/28/21  4:20 PM  Result Value Ref Range   Creatinine, Urine 68.63 mg/dL   Total Protein, Urine 7 mg/dL    Comment: NO NORMAL RANGE ESTABLISHED FOR THIS TEST   Protein Creatinine Ratio 0.10 0.00 - 0.15 mg/mg[Cre]    Comment: Performed at [facility], [address], [place], [place] [address]    Review of Systems  Eyes:  Positive for photophobia. Negative for visual disturbance.  Gastrointestinal:  Negative for abdominal pain.  Neurological:  Positive for headaches.  Physical Exam   Blood pressure 137/83, pulse (!) 119, temperature 98.8 F (37.1 C), temperature source Oral, resp. rate 17, last menstrual period 05/08/2020, not currently breastfeeding.  Patient Vitals for the past 24 hrs:  BP Temp Temp src Pulse Resp Height Weight  01/28/21 1850 -- -- -- -- -- 5' 4.5" (1.638 m)  95.4 kg  01/28/21 1801 140/89 -- -- (!) 113 -- -- --  01/28/21 1731 120/86 -- -- (!) 118 -- -- --  01/28/21 1701 137/83 -- -- (!) 119 -- -- --  01/28/21 1631 137/81 -- -- (!) 119 -- -- --  01/28/21 1614 130/76 -- -- (!) 124 -- --  --  01/28/21 1602 127/68 98.8 F (37.1 C) Oral (!) 117 17 -- --    Physical Exam Vitals and nursing note reviewed.  Constitutional:      General: She is not in acute distress.    Appearance: She is well-developed. She is not ill-appearing, toxic-appearing or diaphoretic.  HENT:     Head: Normocephalic.  Pulmonary:     Effort: Pulmonary effort is normal.  Musculoskeletal:        General: Normal range of motion.  Skin:    General: Skin is warm.  Neurological:     Mental Status: She is alert and oriented to person, place, and time.     GCS: GCS eye subscore is 4. GCS verbal subscore is 5. GCS motor subscore is 6.     Deep Tendon Reflexes: Reflexes normal (Negative clonus).  Psychiatric:        Mood and Affect: Mood normal.   Fetal Tracing: Baseline: 155 bpm Variability: Moderate  Accelerations: 15x15 Decelerations: None Toco: Occasional   MAU Course  Procedures  MDM  PIH labs collected Patient with spots in vision, off and on headaches. BP's are labile. Reviewed admission with Derrill Memo and Dr. Elonda Husky. Magnesium ordered   Assessment and Plan   A:  1. Severe preeclampsia, third trimester   2. [redacted] weeks gestation of pregnancy   3. Supervision of high risk pregnancy, antepartum   4. Chronic hypertension affecting pregnancy      P:  Admit to Labor and delivery Magnesium ordered  Labs resulted  Lezlie Lye, NP 01/28/2021 7:54 PM

## 2021-01-29 ENCOUNTER — Inpatient Hospital Stay (HOSPITAL_COMMUNITY): Payer: Medicaid Other | Admitting: Anesthesiology

## 2021-01-29 DIAGNOSIS — Z3A38 38 weeks gestation of pregnancy: Secondary | ICD-10-CM

## 2021-01-29 DIAGNOSIS — O1490 Unspecified pre-eclampsia, unspecified trimester: Secondary | ICD-10-CM | POA: Diagnosis not present

## 2021-01-29 DIAGNOSIS — O1414 Severe pre-eclampsia complicating childbirth: Secondary | ICD-10-CM

## 2021-01-29 LAB — CBC
HCT: 38.2 % (ref 36.0–46.0)
Hemoglobin: 12.9 g/dL (ref 12.0–15.0)
MCH: 26.6 pg (ref 26.0–34.0)
MCHC: 33.8 g/dL (ref 30.0–36.0)
MCV: 78.8 fL — ABNORMAL LOW (ref 80.0–100.0)
Platelets: 329 10*3/uL (ref 150–400)
RBC: 4.85 MIL/uL (ref 3.87–5.11)
RDW: 16.4 % — ABNORMAL HIGH (ref 11.5–15.5)
WBC: 14.3 10*3/uL — ABNORMAL HIGH (ref 4.0–10.5)
nRBC: 0 % (ref 0.0–0.2)

## 2021-01-29 LAB — MAGNESIUM: Magnesium: 5.7 mg/dL — ABNORMAL HIGH (ref 1.7–2.4)

## 2021-01-29 LAB — RPR: RPR Ser Ql: NONREACTIVE

## 2021-01-29 MED ORDER — EPHEDRINE 5 MG/ML INJ
10.0000 mg | INTRAVENOUS | Status: DC | PRN
  Administered 2021-01-29: 10 mg via INTRAVENOUS

## 2021-01-29 MED ORDER — TERBUTALINE SULFATE 1 MG/ML IJ SOLN: 0.2500 mg | Freq: Once | INTRAMUSCULAR | Status: DC | PRN

## 2021-01-29 MED ORDER — OXYTOCIN-SODIUM CHLORIDE 30-0.9 UT/500ML-% IV SOLN
1.0000 m[IU]/min | INTRAVENOUS | Status: DC
  Administered 2021-01-29: 2 m[IU]/min via INTRAVENOUS
  Filled 2021-01-29: qty 500

## 2021-01-29 MED ORDER — PHENYLEPHRINE 40 MCG/ML (10ML) SYRINGE FOR IV PUSH (FOR BLOOD PRESSURE SUPPORT)
80.0000 ug | PREFILLED_SYRINGE | INTRAVENOUS | Status: DC | PRN
  Administered 2021-01-29 (×2): 80 ug via INTRAVENOUS
  Filled 2021-01-29 (×2): qty 10

## 2021-01-29 MED ORDER — CYCLOBENZAPRINE HCL 5 MG PO TABS
10.0000 mg | ORAL_TABLET | Freq: Once | ORAL | Status: AC
  Administered 2021-01-29: 10 mg via ORAL
  Filled 2021-01-29: qty 2

## 2021-01-29 MED ORDER — CYCLOBENZAPRINE HCL 10 MG PO TABS
10.0000 mg | ORAL_TABLET | Freq: Three times a day (TID) | ORAL | Status: DC | PRN
  Administered 2021-01-29 – 2021-01-30 (×2): 10 mg via ORAL
  Filled 2021-01-29: qty 1
  Filled 2021-01-29: qty 2

## 2021-01-29 MED ORDER — LACTATED RINGERS IV SOLN
500.0000 mL | Freq: Once | INTRAVENOUS | Status: AC
  Administered 2021-01-29: 500 mL via INTRAVENOUS

## 2021-01-29 MED ORDER — LIDOCAINE HCL (PF) 1 % IJ SOLN
INTRAMUSCULAR | Status: DC | PRN
  Administered 2021-01-29: 8 mL via EPIDURAL

## 2021-01-29 MED ORDER — FENTANYL-BUPIVACAINE-NACL 0.5-0.125-0.9 MG/250ML-% EP SOLN
12.0000 mL/h | EPIDURAL | Status: DC | PRN
  Administered 2021-01-29: 12 mL/h via EPIDURAL
  Filled 2021-01-29: qty 250

## 2021-01-29 MED ORDER — TRANEXAMIC ACID-NACL 1000-0.7 MG/100ML-% IV SOLN
INTRAVENOUS | Status: AC
  Administered 2021-01-29: 1000 mg via INTRAVENOUS
  Filled 2021-01-29: qty 100

## 2021-01-29 MED ORDER — EPHEDRINE 5 MG/ML INJ
10.0000 mg | INTRAVENOUS | Status: DC | PRN
  Filled 2021-01-29: qty 5

## 2021-01-29 MED ORDER — DIPHENHYDRAMINE HCL 50 MG/ML IJ SOLN: 12.5000 mg | INTRAMUSCULAR | Status: DC | PRN

## 2021-01-29 MED ORDER — TRANEXAMIC ACID-NACL 1000-0.7 MG/100ML-% IV SOLN: 1000.0000 mg | Freq: Once | INTRAVENOUS | Status: AC

## 2021-01-29 MED ORDER — PHENYLEPHRINE 40 MCG/ML (10ML) SYRINGE FOR IV PUSH (FOR BLOOD PRESSURE SUPPORT)
80.0000 ug | PREFILLED_SYRINGE | INTRAVENOUS | Status: AC | PRN
  Administered 2021-01-29 (×3): 80 ug via INTRAVENOUS

## 2021-01-29 NOTE — Progress Notes (Signed)
Patient ID: Natalie Chase, female   DOB: 1981-02-18, 40 y.o.   MRN: [id number]  S/p cytotec x 2 doses; Pit was started at 0500; she is beginning to feel more ctx;  H/A resolved after Flexeril x 1 dose  BPs 129/76, 122/76 FHR 135-140, +accels, occ variables Ctx irreg, some coupling, 2-5 mins with Pit @ 59mu/min Cx at start of Pit 2/80/vtx -3, soft per RN exam  IUP@38 .6wks cHTN- on ProcardiaXL 60mg  IOL process  Continue to uptitrate Pit to achieve active labor Anticipate vag del  CNM 01/29/2021

## 2021-01-29 NOTE — Anesthesia Procedure Notes (Signed)
Epidural Patient location during procedure: OB Start time: 01/29/2021 3:21 PM End time: 01/29/2021 3:31 PM  Staffing Anesthesiologist: Mellody Dance, MD Performed: anesthesiologist   Preanesthetic Checklist Completed: patient identified, IV checked, site marked, risks and benefits discussed, monitors and equipment checked, pre-op evaluation and timeout performed  Epidural Patient position: sitting Prep: DuraPrep Patient monitoring: heart rate, cardiac monitor, continuous pulse ox and blood pressure Approach: midline Location: L3-L4 Injection technique: LOR saline  Needle:  Needle type: Tuohy  Needle gauge: 17 G Needle length: 9 cm Needle insertion depth: 7 cm Catheter type: closed end flexible Catheter size: 20 Guage Catheter at skin depth: 12 cm Test dose: negative and Other  Assessment Events: blood not aspirated, injection not painful, no injection resistance and negative IV test  Additional Notes Informed consent obtained prior to proceeding including risk of failure, 1% risk of PDPH, risk of minor discomfort and bruising.  Discussed rare but serious complications including epidural abscess, permanent nerve injury, epidural hematoma.  Discussed alternatives to epidural analgesia and patient desires to proceed.  Timeout performed pre-procedure verifying patient name, procedure, and platelet count.  Patient tolerated procedure well.

## 2021-01-29 NOTE — Progress Notes (Signed)
CNM at bedside due to Cat 2 FHR Tracing. Baseline 150, minimal variability with late decelerations. Patient had just hit epidural button for more medicine causing hypotension. RN giving fluid bolus and phenylephrine. Dr. Macon Large made aware of change.   Rolm Bookbinder, CNM 01/29/21 5:36 PM

## 2021-01-29 NOTE — Anesthesia Preprocedure Evaluation (Signed)
Anesthesia Evaluation  Patient identified by MRN, date of birth, ID band Patient awake    Reviewed: Allergy & Precautions, NPO status , Patient's Chart, lab work & pertinent test results  Airway Mallampati: II  TM Distance: >3 FB Neck ROM: Full    Dental   Pulmonary asthma , former smoker,    Pulmonary exam normal breath sounds clear to auscultation       Cardiovascular hypertension (preeclampsia on Mag), Pt. on medications Normal cardiovascular exam Rhythm:Regular Rate:Normal     Neuro/Psych negative neurological ROS  negative psych ROS   GI/Hepatic Neg liver ROS, GERD  ,  Endo/Other  negative endocrine ROSlupus  Renal/GU negative Renal ROS     Musculoskeletal negative musculoskeletal ROS (+)   Abdominal (+) + obese,   Peds  Hematology negative hematology ROS (+) Sickle cell trait ,   Anesthesia Other Findings   Reproductive/Obstetrics (+) Pregnancy                             Anesthesia Physical  Anesthesia Plan  ASA: 2  Anesthesia Plan: Epidural   Post-op Pain Management:    Induction:   PONV Risk Score and Plan: 2 and Treatment may vary due to age or medical condition  Airway Management Planned: Natural Airway  Additional Equipment:   Intra-op Plan:   Post-operative Plan:   Informed Consent: I have reviewed the patients History and Physical, chart, labs and discussed the procedure including the risks, benefits and alternatives for the proposed anesthesia with the patient or authorized representative who has indicated his/her understanding and acceptance.       Plan Discussed with: Anesthesiologist  Anesthesia Plan Comments:         Anesthesia Quick Evaluation

## 2021-01-29 NOTE — Discharge Summary (Signed)
Postpartum Discharge Summary       Patient Name: Natalie Chase DOB: 1980/08/27 MRN: [id number]  Date of admission: 01/28/2021 Delivery date:01/29/2021  Delivering provider: Genia Del  Date of discharge: 02/01/2021  Admitting diagnosis: Encounter for induction of labor [Z34.90] Indication for care in labor and delivery, antepartum [O75.9] Intrauterine pregnancy: [redacted]w[redacted]d    Secondary diagnosis:  Principal Problem:   Vaginal delivery Active Problems:   Chronic hypertension affecting pregnancy   Sickle cell trait in mother affecting pregnancy ([place]   Lupus (systemic lupus erythematosus) ([place]   Advanced maternal age in multigravida, third trimester   Preeclampsia  Additional problems: Acute kidney injury    Discharge diagnosis: Term Pregnancy Delivered                                              Post partum procedures:magnesium sulfate Augmentation: AROM, Pitocin, and Cytotec Complications: None  Hospital course: Induction of Labor With Vaginal Delivery   40y.o. yo [id number] admitted to the hospital 01/28/2021 for induction of labor.  Indication for induction:  cHTN with superimposed pre-eclampsia .  Patient started her induction with Cytotec followed by Pitocin and AROM until she progressed to complete. She had an uncomplicated vaginal delivery. Membrane Rupture Time/Date: 4:04 PM ,01/29/2021   Delivery Method:Vaginal, Spontaneous  Episiotomy: None  Lacerations:  Perineal  Details of delivery can be found in separate delivery note.  Patient had a routine postpartum course and is meeting all milestones. Her BP postpartum was well controlled with procardia and lasix. She was continued on magnesium for 24 hours post-delivery. She will be discharged home with the same medical regimen. Patient is discharged home 02/01/21.  Newborn Data: Birth date:01/29/2021  Birth time:10:45 PM  Gender:Female  Living status:Living  Apgars:7 ,9  Weight:3245 g   Magnesium  Sulfate received: Yes: Seizure prophylaxis BMZ received: No Rhophylac: N/A MMR: N/A - Immune  T-DaP: Offered postpartum Flu: Given prenatally Transfusion: No   Physical exam  Vitals:   01/31/21 1100 01/31/21 1950 02/01/21 0600 02/01/21 0734  BP: (!) 142/71 128/66 (!) 146/81 (!) 141/66  Pulse: 98 88 94 90  Resp: _0 Temp: 98.2 F (36.8 C) 98.1 F (36.7 C) 97.8 F (36.6 C) 98.4 F (36.9 C)  TempSrc: Oral Oral Oral Oral  SpO2: 99% 100%  100%  Weight:      Height:       General: alert, cooperative, and no distress Lochia: appropriate Uterine Fundus: firm Incision: N/A DVT Evaluation: No evidence of DVT seen on physical exam.  Labs: Lab Results  Component Value Date   WBC 24.9 (H) 01/30/2021   HGB 11.4 (L) 01/30/2021   HCT 33.5 (L) 01/30/2021   MCV 79.0 (L) 01/30/2021   PLT 299 01/30/2021   CMP Latest Ref Rng & Units 01/31/2021  Glucose 70 - 99 mg/dL 134(H)  BUN 6 - 20 mg/dL 11  Creatinine 0.44 - 1.00 mg/dL 1.09(H)  Sodium 135 - 145 mmol/L 135  Potassium 3.5 - 5.1 mmol/L 3.4(L)  Chloride 98 - 111 mmol/L 104  CO2 22 - 32 mmol/L 25  Calcium 8.9 - 10.3 mg/dL 7.6(L)  Total Protein 6.5 - 8.1 g/dL 5.2(L)  Total Bilirubin 0.3 - 1.2 mg/dL <0.1(L)  Alkaline Phos 38 - 126 U/L 99  AST 15 - 41 U/L 22  ALT  0 - 44 U/L 20   Edinburgh Score: Edinburgh Postnatal Depression Scale Screening Tool 01/31/2021  I have been able to laugh and see the funny side of things. (No Data)  I have looked forward with enjoyment to things. -  I have blamed myself unnecessarily when things went wrong. -  I have been anxious or worried for no good reason. -  I have felt scared or panicky for no good reason. -  Things have been getting on top of me. -  I have been so unhappy that I have had difficulty sleeping. -  I have felt sad or miserable. -  I have been so unhappy that I have been crying. -  The thought of harming myself has occurred to me. Inocente Salles Postnatal Depression Scale  Total -     After visit meds:  Allergies as of 02/01/2021   No Known Allergies      Medication List     STOP taking these medications    Aspirin Low Dose 81 MG EC tablet Generic drug: aspirin   pantoprazole 40 MG tablet Commonly known as: Protonix       TAKE these medications    albuterol 108 (90 Base) MCG/ACT inhaler Commonly known as: VENTOLIN HFA Inhale 1-2 puffs into the lungs every 6 (six) hours as needed for wheezing or shortness of breath.   furosemide 20 MG tablet Commonly known as: LASIX Take 1 tablet (20 mg total) by mouth 2 (two) times daily.   ibuprofen 600 MG tablet Commonly known as: ADVIL Take 1 tablet (600 mg total) by mouth every 6 (six) hours.   NIFEdipine 60 MG 24 hr tablet Commonly known as: ADALAT CC Take 1 tablet (60 mg total) by mouth daily.   Prenatal 27-1 MG Tabs Take 1 tablet by mouth daily.   sertraline 50 MG tablet Commonly known as: Zoloft Take 1 tablet (50 mg total) by mouth daily.               Durable Medical Equipment  (From admission, onward)           Start     Ordered   01/30/21 1347  For home use only DME double electric breast pump  Once       Comments: Z39.1   01/30/21 1346             Discharge home in stable condition Infant Feeding: Bottle and Breast Infant Disposition:home with mother Discharge instruction: per After Visit Summary and Postpartum booklet. Activity: Advance as tolerated. Pelvic rest for 6 weeks.  Diet: routine diet Future Appointments: Future Appointments  Date Time Provider Department Center  02/07/2021  1:20 PM [facility] NURSE [facility] None  03/10/2021  1:10 PM Hermina Staggers, MD [facility] None   Follow up Visit:  Follow-up Information     [facility] Follow up on 02/07/2021.   Why: at 1:20pm for blood pressure check Contact information: [address] [place] [address] [phone]               Message sent to  [facility] by Dr. Mathis Fare on 01/29/21.   Please schedule this patient for a In person postpartum visit in  4-6 weeks  with the following provider: Any provider. Additional Postpartum F/U: BP check 1 week  High risk pregnancy complicated by:  cHTN with superimposed pre-eclampsia Delivery mode:  Vaginal, Spontaneous  Anticipated Birth Control:  POPs   02/01/2021 Catalina Antigua, MD

## 2021-01-29 NOTE — Progress Notes (Signed)
Labor Progress Note Natalie Chase is a 40 y.o. [id number] at [redacted]w[redacted]d presented for IOL for CHTN with SIPE  S:  Patient reports HA is improved with flexeril. Starting to get more uncomfortable with contractions.   O:  BP (!) 155/85   Pulse (!) 101   Temp 97.7 F (36.5 C) (Axillary)   Resp 18   Ht 5' 4.5" (1.638 m)   Wt 95.4 kg   LMP 05/08/2020 (Approximate)   SpO2 99%   BMI 35.54 kg/m   Fetal Tracing:  Baseline: 150 Variability: moderate Accels: 10x10 Decels: none  Toco: 1-5   CVE: Dilation: 2 Effacement (%): 90 Cervical Position: Posterior Station: -3 Presentation: Vertex Exam by:: Ma Hillock CNM   A&P: 40 y.o. [id number] [redacted]w[redacted]d IOL CHTN with SIPE #Labor: Cervix unchanged. Patient declines foley. Will continue pitocin. #Pain: per patient request #FWB: Cat 1 #GBS negative  Rolm Bookbinder, CNM 10:30 AM

## 2021-01-29 NOTE — Progress Notes (Signed)
Natalie Chase is a 40 y.o. [id number] at [redacted]w[redacted]d admitted for induction of labor due to [facility] now with SIPE.  Subjective: Patient getting more uncomfortable with contractions. Feels they are getting more intense. Breathing through contractions. Thinks wants epidural at some point but not yet.   Objective: BP (!) 144/84   Pulse 96   Temp 97.9 F (36.6 C) (Oral)   Resp 18   Ht 5' 4.5" (1.638 m)   Wt 95.4 kg   LMP 05/08/2020 (Approximate)   SpO2 99%   BMI 35.54 kg/m  I/O last 3 completed shifts: In: 2307.8 [P.O.:650; I.V.:1657.8] Out: 3550 [Urine:3550] Total I/O In: 694.4 [I.V.:694.4] Out: 1900 [Urine:1900]  FHT:  FHR: 140 bpm, variability: moderate,  accelerations: yes  decelerations:  one variable deceleration UC:   regular, every 1 minutes SVE:   Dilation: 2.5 Effacement (%): 90 Station: -3 Exam by:: Dos MD  Labs: Lab Results  Component Value Date   WBC 14.3 (H) 01/29/2021   HGB 12.9 01/29/2021   HCT 38.2 01/29/2021   MCV 78.8 (L) 01/29/2021   PLT 329 01/29/2021    Assessment / Plan: [redacted]w[redacted]d admitted for induction of labor due to cHTN now with SIPE on Mg. Checked in on patient while her provider for the day is tied up in a delivery. Given prolonged induction due for reassessment. Checked patient and head still ballotable and cervix ~2-2.5cm. Very effaced 90-100% effaced.   - will continue uptitration of pitocin and reassess for AROM at next check  Preeclampsia: BP now mild range- on magnesium sulfate. No symptoms at this time.   Warner Mccreedy 01/29/2021, 2:58 PM

## 2021-01-29 NOTE — Progress Notes (Signed)
Patient ID: Natalie Chase, female   DOB: [birth year], 40 y.o.   MRN: [id number]  Now H/A is back- doesn't want to take Tylenol due to potential link w ASD  BPs 152/78, 131/66 FHR 140s, +accels, occ mi variables Ctx q 3-5 mins Cx TBD  IUP@38 .6wks cHTN IOL process  -Will try Flexeril for H/A -Plan for 2nd dose of cytotec if cx is still 2cm; if 3cm or > will start Pit 2x2 -Anticipate vag del  Arabella Merles CNM 01/29/2021 12:24 AM

## 2021-01-29 NOTE — Progress Notes (Signed)
Labor Progress Note Natalie Chase is a 40 y.o. [phone] at [redacted]w[redacted]d presented for IOL for CHTN with SIPE  S:  Patient comfortable with epidural  O:  BP (!) 109/55   Pulse (!) 101   Temp 97.9 F (36.6 C) (Oral)   Resp 18   Ht 5' 4.5" (1.638 m)   Wt 95.4 kg   LMP 05/08/2020 (Approximate)   SpO2 99%   BMI 35.54 kg/m   Fetal Tracing:  Baseline: 155 Variability: moderate Accels: none Decels: none   Toco: 1-3   CVE: Dilation: 2.5 Effacement (%): 90 Cervical Position: Posterior Station: -3 Presentation: Vertex Exam by:: Dos MD   A&P: 40 y.o. [id number] [redacted]w[redacted]d IOL CHTN with SIPE #Labor: Progressing well. Discussed with patient risks and benefits of AROM for augmentation of labor. Patient agreeable to plan of care. AROM with small amount of clear fluid. Patient and FHR tolerated procedure well.   Continue pitocin  Will check Mag level  #Pain: epidural #FWB: Cat 2, one prolonged deceleration immediately after epidural placement, resoved with phenylephrine  #GBS negative  Rolm Bookbinder, CNM 4:08 PM

## 2021-01-30 ENCOUNTER — Inpatient Hospital Stay (HOSPITAL_COMMUNITY): Payer: Medicaid Other

## 2021-01-30 ENCOUNTER — Inpatient Hospital Stay (HOSPITAL_COMMUNITY)
Admission: AD | Admit: 2021-01-30 | Payer: Medicaid Other | Source: Home / Self Care | Admitting: Obstetrics and Gynecology

## 2021-01-30 ENCOUNTER — Encounter (HOSPITAL_COMMUNITY): Payer: Self-pay | Admitting: Obstetrics and Gynecology

## 2021-01-30 LAB — COMPREHENSIVE METABOLIC PANEL
ALT: 26 U/L (ref 0–44)
ALT: 23 U/L (ref 0–44)
AST: 33 U/L (ref 15–41)
AST: 26 U/L (ref 15–41)
Albumin: 2.4 g/dL — ABNORMAL LOW (ref 3.5–5.0)
Albumin: 2.1 g/dL — ABNORMAL LOW (ref 3.5–5.0)
Alkaline Phosphatase: 110 U/L (ref 38–126)
Alkaline Phosphatase: 89 U/L (ref 38–126)
Anion gap: 9 (ref 5–15)
Anion gap: 5 (ref 5–15)
BUN: 11 mg/dL (ref 6–20)
BUN: 10 mg/dL (ref 6–20)
CO2: 20 mmol/L — ABNORMAL LOW (ref 22–32)
CO2: 23 mmol/L (ref 22–32)
Calcium: 7.2 mg/dL — ABNORMAL LOW (ref 8.9–10.3)
Calcium: 7.2 mg/dL — ABNORMAL LOW (ref 8.9–10.3)
Chloride: 100 mmol/L (ref 98–111)
Chloride: 106 mmol/L (ref 98–111)
Creatinine, Ser: 1.28 mg/dL — ABNORMAL HIGH (ref 0.44–1.00)
Creatinine, Ser: 1.15 mg/dL — ABNORMAL HIGH (ref 0.44–1.00)
GFR, Estimated: 55 mL/min — ABNORMAL LOW (ref >60)
GFR, Estimated: >60 mL/min (ref >60)
Glucose, Bld: 240 mg/dL — ABNORMAL HIGH (ref 70–99)
Glucose, Bld: 122 mg/dL — ABNORMAL HIGH (ref 70–99)
Potassium: 3.9 mmol/L (ref 3.5–5.1)
Potassium: 4 mmol/L (ref 3.5–5.1)
Sodium: 129 mmol/L — ABNORMAL LOW (ref 135–145)
Sodium: 134 mmol/L — ABNORMAL LOW (ref 135–145)
Total Bilirubin: 0.5 mg/dL (ref 0.3–1.2)
Total Bilirubin: 0.1 mg/dL — ABNORMAL LOW (ref 0.3–1.2)
Total Protein: 5.9 g/dL — ABNORMAL LOW (ref 6.5–8.1)
Total Protein: 5.2 g/dL — ABNORMAL LOW (ref 6.5–8.1)

## 2021-01-30 LAB — CBC
HCT: 33.5 % — ABNORMAL LOW (ref 36.0–46.0)
Hemoglobin: 11.4 g/dL — ABNORMAL LOW (ref 12.0–15.0)
MCH: 26.9 pg (ref 26.0–34.0)
MCHC: 34 g/dL (ref 30.0–36.0)
MCV: 79 fL — ABNORMAL LOW (ref 80.0–100.0)
Platelets: 299 10*3/uL (ref 150–400)
RBC: 4.24 MIL/uL (ref 3.87–5.11)
RDW: 16.4 % — ABNORMAL HIGH (ref 11.5–15.5)
WBC: 24.9 10*3/uL — ABNORMAL HIGH (ref 4.0–10.5)
nRBC: 0 % (ref 0.0–0.2)

## 2021-01-30 LAB — MAGNESIUM: Magnesium: 5.6 mg/dL — ABNORMAL HIGH (ref 1.7–2.4)

## 2021-01-30 MED ORDER — LACTATED RINGERS IV SOLN: INTRAVENOUS | Status: AC

## 2021-01-30 MED ORDER — SIMETHICONE 80 MG PO CHEW: 80.0000 mg | CHEWABLE_TABLET | ORAL | Status: DC | PRN

## 2021-01-30 MED ORDER — DIPHENHYDRAMINE HCL 25 MG PO CAPS: 25.0000 mg | ORAL_CAPSULE | Freq: Four times a day (QID) | ORAL | Status: DC | PRN

## 2021-01-30 MED ORDER — BENZOCAINE-MENTHOL 20-0.5 % EX AERO
1.0000 "application " | INHALATION_SPRAY | CUTANEOUS | Status: DC | PRN
  Administered 2021-01-31: 1 via TOPICAL
  Filled 2021-01-30: qty 56

## 2021-01-30 MED ORDER — SENNOSIDES-DOCUSATE SODIUM 8.6-50 MG PO TABS
2.0000 | ORAL_TABLET | Freq: Every day | ORAL | Status: DC
  Administered 2021-01-30 – 2021-02-01 (×3): 2 via ORAL
  Filled 2021-01-30 (×3): qty 2

## 2021-01-30 MED ORDER — COCONUT OIL OIL
1.0000 "application " | TOPICAL_OIL | Status: DC | PRN
  Administered 2021-01-31: 1 via TOPICAL

## 2021-01-30 MED ORDER — CYCLOBENZAPRINE HCL 10 MG PO TABS
5.0000 mg | ORAL_TABLET | Freq: Three times a day (TID) | ORAL | Status: DC | PRN
  Administered 2021-01-30: 16:00:00 5 mg via ORAL
  Filled 2021-01-30: qty 1

## 2021-01-30 MED ORDER — MAGNESIUM SULFATE 40 GM/1000ML IV SOLN: 1.0000 g/h | INTRAVENOUS | Status: AC

## 2021-01-30 MED ORDER — WITCH HAZEL-GLYCERIN EX PADS: 1.0000 "application " | MEDICATED_PAD | CUTANEOUS | Status: DC | PRN

## 2021-01-30 MED ORDER — ONDANSETRON HCL 4 MG/2ML IJ SOLN: 4.0000 mg | INTRAMUSCULAR | Status: DC | PRN

## 2021-01-30 MED ORDER — ONDANSETRON HCL 4 MG PO TABS: 4.0000 mg | ORAL_TABLET | ORAL | Status: DC | PRN

## 2021-01-30 MED ORDER — ACETAMINOPHEN 325 MG PO TABS: 650.0000 mg | ORAL_TABLET | ORAL | Status: DC | PRN

## 2021-01-30 MED ORDER — DIBUCAINE (PERIANAL) 1 % EX OINT: 1.0000 "application " | TOPICAL_OINTMENT | CUTANEOUS | Status: DC | PRN

## 2021-01-30 MED ORDER — PRENATAL MULTIVITAMIN CH
1.0000 | ORAL_TABLET | Freq: Every day | ORAL | Status: DC
  Administered 2021-01-30 – 2021-02-01 (×3): 1 via ORAL
  Filled 2021-01-30 (×3): qty 1

## 2021-01-30 MED ORDER — IBUPROFEN 600 MG PO TABS
600.0000 mg | ORAL_TABLET | Freq: Four times a day (QID) | ORAL | Status: DC
  Administered 2021-01-30 – 2021-02-01 (×9): 600 mg via ORAL
  Filled 2021-01-30 (×10): qty 1

## 2021-01-30 MED ORDER — TETANUS-DIPHTH-ACELL PERTUSSIS 5-2.5-18.5 LF-MCG/0.5 IM SUSY: 0.5000 mL | PREFILLED_SYRINGE | Freq: Once | INTRAMUSCULAR | Status: DC

## 2021-01-30 NOTE — Progress Notes (Addendum)
CSW acknowledges consult and will follow up once mag is d/c @2245 .  , MSW, LCSW-A Clinical Social Worker- Weekends [phone]

## 2021-01-30 NOTE — Lactation Note (Signed)
This note was copied from a baby's chart. Lactation Consultation Note  Patient Name: Natalie Chase GGYIR'S Date: 01/30/2021 Reason for consult: Follow-up assessment Age:38 hours  P3, Baby latched upon entering. Mother pumped approx 4 ml earlier today. Discussed milk storage, cleaning and stork pump. Feed on demand with cues.  Goal 8-12+ times per day after first 24 hrs.  Place baby STS if not cueing.  Mom made aware of O/P services, breastfeeding support groups, community resources, and our phone # for post-discharge questions.    Maternal Data Has patient been taught Hand Expression?: Yes (excellent flow) Does the patient have breastfeeding experience prior to this delivery?: Yes How long did the patient breastfeed?: 6 mos, 10 mos.  Feeding Mother's Current Feeding Choice: Breast Milk  LATCH Score Latch: Grasps breast easily, tongue down, lips flanged, rhythmical sucking.  Audible Swallowing: A few with stimulation  Type of Nipple: Everted at rest and after stimulation  Comfort (Breast/Nipple): Soft / non-tender  Hold (Positioning): No assistance needed to correctly position infant at breast.  LATCH Score: 9   Lactation Tools Discussed/Used    Interventions Interventions: Breast feeding basics reviewed;DEBP;Education;LC Services brochure  Discharge Pump: Stork Pump  Consult Status Consult Status: Follow-up Date: 01/31/21 Follow-up type: In-patient    Dahlia Byes [facility] 01/30/2021, 1:43 PM

## 2021-01-30 NOTE — Progress Notes (Signed)
Post Partum Day 1 s/p VD after IOL for CHTN with SI severe PEC Subjective: Patient denies any severe headaches (had this overnight alleviated by Flexeril), visual symptoms, RUQ/epigastric pain or other concerning symptoms. Mild lower abdominal pain, voiding adequately and tolerating PO. Breastfeeding. Baby doing well at bedside.  Objective: Blood pressure (!) 149/86, pulse (!) 102, temperature 97.7 F (36.5 C), temperature source Oral, resp. rate 16, height 5' 4.5" (1.638 m), weight 95.4 kg, last menstrual period 05/08/2020, SpO2 99 %, unknown if currently breastfeeding.  Physical Exam:  General: alert and no distress Lochia: appropriate Uterine Fundus: firm, NT DVT Evaluation: No evidence of DVT seen on physical exam. Negative Homan's sign. No cords or calf tenderness. No significant calf/ankle edema.  Labs: CBC Latest Ref Rng & Units 01/30/2021 01/29/2021 01/28/2021  WBC 4.0 - 10.5 K/uL 24.9(H) 14.3(H) 15.2(H)  Hemoglobin 12.0 - 15.0 g/dL 11.4(L) 12.9 13.3  Hematocrit 36.0 - 46.0 % 33.5(L) 38.2 40.0  Platelets 150 - 400 K/uL 299 329 350   CMP Latest Ref Rng & Units 01/30/2021 01/28/2021 08/03/2020  Glucose 70 - 99 mg/dL 696(E) 952(W) 83  BUN 6 - 20 mg/dL 11 12 7   Creatinine 0.44 - 1.00 mg/dL ) 4.13(K) 4.40(N  Sodium 135 - 145 mmol/L 129(L) 135 136  Potassium 3.5 - 5.1 mmol/L 3.9 4.4 3.2(L)  Chloride 98 - 111 mmol/L 100 102 104  CO2 22 - 32 mmol/L 20(L) 23 23  Calcium 8.9 - 10.3 mg/dL 7.2(L) 9.9 8.7(L)  Total Protein 6.5 - 8.1 g/dL 5.9(L) 7.1 6.9  Total Bilirubin 0.3 - 1.2 mg/dL 0.5 0.4 0.5  Alkaline Phos 38 - 126 U/L 110 128(H) 47  AST 15 - 41 U/L 33 22 16  ALT 0 - 44 U/L 26 20 14     Assessment/Plan: Doing well overall - Concerned about Cr of 1.28. Already on magnesium sulfate 1g/hr. Great UOP.  Will recheck Cr around 1000, may need to hold magnesium sulfate if still increasing. Magnesium level was therapeutic this morning. - Continue Procardia XL 60 mg daily for BP, titrate as  needed - Routine postpartum care   LOS: 2 days   0.27, MD 01/30/2021, 8:08 AM

## 2021-01-30 NOTE — Anesthesia Postprocedure Evaluation (Signed)
Anesthesia Post Note  Patient: Lashai Grosch  Procedure(s) Performed: AN AD HOC LABOR EPIDURAL     Patient location during evaluation: Mother Baby Anesthesia Type: Epidural Level of consciousness: awake and alert Pain management: pain level controlled Vital Signs Assessment: post-procedure vital signs reviewed and stable Respiratory status: spontaneous breathing, nonlabored ventilation and respiratory function stable Cardiovascular status: stable Postop Assessment: no headache, no backache, epidural receding, no apparent nausea or vomiting, patient able to bend at knees, adequate PO intake and able to ambulate Anesthetic complications: no   No notable events documented.  Last Vitals:  Vitals:   01/30/21 0900 01/30/21 1000  BP:    Pulse:    Resp: 17 16  Temp:    SpO2:      Last Pain:  Vitals:   01/30/21 0815  TempSrc: Oral  PainSc:    Pain Goal: Patients Stated Pain Goal: 1 (01/30/21 0225)                 Laban Emperor

## 2021-01-31 LAB — COMPREHENSIVE METABOLIC PANEL
ALT: 20 U/L (ref 0–44)
AST: 22 U/L (ref 15–41)
Albumin: 2.2 g/dL — ABNORMAL LOW (ref 3.5–5.0)
Alkaline Phosphatase: 99 U/L (ref 38–126)
Anion gap: 6 (ref 5–15)
BUN: 11 mg/dL (ref 6–20)
CO2: 25 mmol/L (ref 22–32)
Calcium: 7.6 mg/dL — ABNORMAL LOW (ref 8.9–10.3)
Chloride: 104 mmol/L (ref 98–111)
Creatinine, Ser: 1.09 mg/dL — ABNORMAL HIGH (ref 0.44–1.00)
GFR, Estimated: >60 mL/min (ref >60)
Glucose, Bld: 134 mg/dL — ABNORMAL HIGH (ref 70–99)
Potassium: 3.4 mmol/L — ABNORMAL LOW (ref 3.5–5.1)
Sodium: 135 mmol/L (ref 135–145)
Total Bilirubin: <0.1 mg/dL — ABNORMAL LOW (ref 0.3–1.2)
Total Protein: 5.2 g/dL — ABNORMAL LOW (ref 6.5–8.1)

## 2021-01-31 MED ORDER — FUROSEMIDE 40 MG PO TABS
20.0000 mg | ORAL_TABLET | Freq: Two times a day (BID) | ORAL | Status: DC
  Administered 2021-01-31 – 2021-02-01 (×2): 20 mg via ORAL
  Filled 2021-01-31 (×2): qty 1

## 2021-01-31 NOTE — Progress Notes (Signed)
Post Partum Day 2 Subjective: no complaints, up ad lib, and voiding  Objective: Blood pressure (!) 142/71, pulse 98, temperature 98.2 F (36.8 C), temperature source Oral, resp. rate 18, height 5' 4.5" (1.638 m), weight 95.4 kg, last menstrual period 05/08/2020, SpO2 99 %, unknown if currently breastfeeding. Vitals:   01/30/21 2312 01/31/21 0426 01/31/21 0804 01/31/21 1100  BP: 129/72 117/75 (!) 123/59 (!) 142/71  Pulse: (!) 101 86 86 98  Resp: 16 16 16 18   Temp: 97.6 F (36.4 C) (!) 97.2 F (36.2 C) 98.3 F (36.8 C) 98.2 F (36.8 C)  TempSrc: Oral Oral Oral Oral  SpO2: 100% 99% 98% 99%  Weight:      Height:        Intake/Output Summary (Last 24 hours) at 01/31/2021 1356 Last data filed at 01/31/2021 14/06/2020 Gross per 24 hour  Intake 2371.88 ml  Output 2650 ml  Net -278.12 ml  Net -2 L since admission  Physical Exam:  General: alert, cooperative, and appears stated age 89: appropriate Uterine Fundus: firm Incision: healing well DVT Evaluation: No evidence of DVT seen on physical exam.  Recent Labs    01/29/21 0943 01/30/21 0414  HGB 12.9 11.4*  HCT 38.2 33.5*    Assessment/Plan: Preeclampsia with severe features -status post magnesium sulfate x24 hours.  BPs are in the mild range Continue Procardia 60 XL daily Additional Lasix 20 mg p.o. twice daily for 3 to 5 days is initiated today.  Acute kidney injury -slow recovery, creatinine is 1.09 today  Plan for discharge tomorrow, Breastfeeding, and Contraception POPs   LOS: 3 days   14/04/22 01/31/2021, 1:55 PM

## 2021-01-31 NOTE — Clinical Social Work Maternal (Signed)
CLINICAL SOCIAL WORK MATERNAL/CHILD NOTE  Patient Details  Name: [name] MRN: [id number] Date of Birth: 07/20/1980  Date:  April 26, 2020  Clinical Social Worker Initiating Note:  Laurey Arrow Date/Time: Initiated:  01/31/21/1303     Child's Name:  Robby Sermon   Biological Parents:  Mother, Father (FOB is Naseem Adler 09/29/1971)   Need for Interpreter:  None   Reason for Referral:  Current Substance Use/Substance Use During Pregnancy  , Behavioral Health Concerns, Other (Comment) (Needs car seat)   Address:  [place] [address]    Phone number:  [phone] (home)     Additional phone number:   Household Members/Support Persons (HM/SP):   Household Member/Support Person 1 (MOB also has an 6 year old son that resides in her home.)   HM/SP Name Relationship DOB or Age  HM/SP -1 [place] son 05/11/2019  HM/SP -2        HM/SP -3        HM/SP -4        HM/SP -5        HM/SP -6        HM/SP -7        HM/SP -8          Natural Supports (not living in the home):  Extended Family, Spouse/significant other, Immediate Family   Professional Supports: Transport planner (Therapist is Lynnea Ferrier with [facility].)   Employment: Unemployed   Type of Work:     Education:  Programmer, systems   Homebound arranged:    Museum/gallery curator Resources:  Medicaid   Other Resources:  Physicist, medical  , [place] Considerations Which May Impact Care: None reported  Strengths:  Compliance with medical plan  , Home prepared for child  , Understanding of illness, Psychotropic Medications, Ability to meet basic needs  , Pediatrician chosen   Psychotropic Medications:         Pediatrician:    [facility] area  Pediatrician List:   [facility]  [place]      Pediatrician Fax Number:    Risk Factors/Current Problems:  Substance Use   , Mental Health Concerns     Cognitive State:  Alert  , Insightful  , Linear Thinking     Mood/Affect:  Comfortable  , Interested  , Flat  , Relaxed     CSW Assessment: CSW received consult for history of depression, THC use, and need for infant car seat.  CSW met with MOB to offer support and complete assessment.    MOB was resting in bed and infant was asleep in bassinet when CSW entered the room. CSW introduced self and explained reason for consult to which MOB expressed understanding. MOB was very pleasant and easy to engage throughout assessment. MOB reported she currently lives with her aunt, uncle, and her 3 children and is currently in between jobs. MOB confirmed she receives both [employer] and food stamps and is aware she needs to call and update them of her delivery. 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 MOB stated things were at there peak after the birth of her second son in 2021. MOB reported she has a really good support system and expressed feeling comfortable seeking help if symptoms start to arise. MOB reported that currently her medication and  therapy is managing her symtoms. CSW provided education regarding the baby blues period vs. perinatal mood disorders. CSW recommended self-evaluation during the postpartum time period using the New Mom Checklist from Postpartum Progress and encouraged MOB to contact a medical professional if symptoms are noted at any time. MOB did not appear to be displaying any acute mental health symptoms and denied any current SI, HI, or DV. MOB confirmed having all essential items for infant with the exception of a car seat.  MOB is aware that she can purchase a car seat from the hospital for $30.  MOB plans to make  purchase today. Per MOB once discharged  infant would be sleeping in a bassinet once home. CSW provided review of Sudden Infant Death Syndrome (SIDS) precautions and safe  sleeping habits.    CSW inquired about MOB's substance use during pregnancy and MOB reported use at the beginning of her pregnancy.  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 CSW informed MOB of [place] and explained CDS is pending (UDS was not collected) and  a CPS report would be made, if warranted. MOB denied any questions or concerns regarding policy and shared no previous CPS involvement.   CSW to continue to monitor CDS results and make CPS report, if warranted.  There are no barriers to discharge.   CSW Plan/Description:  No Further Intervention Required/No Barriers to Discharge, Sudden Infant Death Syndrome (SIDS) Education, Perinatal Mood and Anxiety Disorder (PMADs) Education, Other Patient/Family Education, [place], Other Information/Referral to [employer], CSW Will Continue to Monitor Umbilical Cord Tissue Drug Screen Results and Make Report if Warranted   Laurey Arrow, MSW, LCSW Clinical Social Work [phone]   Dimple Nanas, LCSW 01/31/2021, 1:10 PM

## 2021-01-31 NOTE — Lactation Note (Signed)
This note was copied from a baby's chart. Lactation Consultation Note  Patient Name: Natalie Chase [id number] Date: 01/31/2021 Reason for consult: Follow-up assessment;Mother's request;Early term 37-38.6wks;Infant weight loss;Breastfeeding assistance Age:38 hours LC assisted with different positions to get more depth on breast and infant able to control volume. Milk with hand expression shoots out, latching in prone position allowed infant to control volume more.  Mom preference latching at the breast. She declined use of DEBP. She will offer more volume with hand pump but prefers to latch at the breast.   Infant adequate urine and stool output to account for weight loss. Mom offer both breasts and listen for audible swallows. At times dimpling of the cheeks but with changes made it resolved. Mom denied any pain with the latch.  All questions answered at the end of the visit.  Maternal Data Has patient been taught Hand Expression?: Yes  Feeding Mother's Current Feeding Choice: Breast Milk  LATCH Score Latch: Grasps breast easily, tongue down, lips flanged, rhythmical sucking.  Audible Swallowing: Spontaneous and intermittent  Type of Nipple: Everted at rest and after stimulation  Comfort (Breast/Nipple): Soft / non-tender  Hold (Positioning): Assistance needed to correctly position infant at breast and maintain latch.  LATCH Score: 9   Lactation Tools Discussed/Used    Interventions Interventions: Breast feeding basics reviewed;Support pillows;Education;Assisted with latch;Position options;Skin to skin;Expressed Clinical research associate;Infant Driven Feeding Algorithm education;Breast compression;Adjust position  Discharge    Consult Status Consult Status: Follow-up Date: 02/01/21 Follow-up type: In-patient    Airen Dales  Nicholson-Springer 01/31/2021, 5:16 PM

## 2021-01-31 NOTE — Progress Notes (Signed)
CSW delivered car seat to MOB and received payment of $30.00 (cash).  Tieler Cournoyer Boyd-Gilyard, MSW, LCSW Clinical Social Work [phone]  

## 2021-02-01 ENCOUNTER — Other Ambulatory Visit (HOSPITAL_COMMUNITY): Payer: Self-pay

## 2021-02-01 MED ORDER — FUROSEMIDE 20 MG PO TABS
20.0000 mg | ORAL_TABLET | Freq: Two times a day (BID) | ORAL | 0 refills | Status: DC
  Filled 2021-02-01: qty 8, 4d supply, fill #0

## 2021-02-01 MED ORDER — IBUPROFEN 600 MG PO TABS
600.0000 mg | ORAL_TABLET | Freq: Four times a day (QID) | ORAL | 0 refills | Status: DC
  Filled 2021-02-01: qty 30, 8d supply, fill #0

## 2021-02-01 NOTE — Lactation Note (Addendum)
This note was copied from a baby's chart. Lactation Consultation Note  Patient Name: Natalie Chase DVVOH'Y Date: 02/01/2021 Reason for consult: Follow-up assessment Age:38 hours  Mother's breasts are filling, uncomfortable not engorged yet. Encouraged breastfeeding on demand both breasts per feeding if baby will latch. Post pump for comfort 5-7 min. Pump for bottle 15-20 min. Provided ice packs for comfort. Feed on demand with cues.  Goal 8-12+ times per day after first 24 hrs.  Place baby STS if not cueing.  Stools transitioning.  Volume pumped 60 ml+.    Maternal Data Does the patient have breastfeeding experience prior to this delivery?: Yes  Feeding Mother's Current Feeding Choice: Breast Milk  Interventions Interventions: Ice;Education;DEBP  Discharge Discharge Education: Engorgement and breast care;Warning signs for feeding baby Pump: Advised to call insurance company  Consult Status Consult Status: Complete Date: 02/01/21    Dahlia Byes [facility] 02/01/2021, 11:28 AM

## 2021-02-03 ENCOUNTER — Other Ambulatory Visit (HOSPITAL_COMMUNITY): Payer: Self-pay

## 2021-02-07 ENCOUNTER — Ambulatory Visit: Payer: Medicaid Other | Admitting: *Deleted

## 2021-02-07 ENCOUNTER — Other Ambulatory Visit: Payer: Self-pay

## 2021-02-07 DIAGNOSIS — Z0130 Encounter for examination of blood pressure without abnormal findings: Secondary | ICD-10-CM

## 2021-02-07 NOTE — Progress Notes (Signed)
Patient was assessed and managed by nursing staff during this encounter. I have reviewed the chart and agree with the documentation and plan. I have also made any necessary editorial changes.  Erol Flanagin, MD 02/07/2021 1:59 PM   

## 2021-02-07 NOTE — Progress Notes (Signed)
Subjective:  Natalie Chase is a 40 y.o. female here for BP check.   Hypertension ROS: taking medications as instructed, no medication side effects noted, no TIA's, no chest pain on exertion, no dyspnea on exertion, and no swelling of ankles. Having occasional HA's.   Objective:  LMP 05/08/2020 (Approximate)    Appearance alert, well appearing, and in no distress. General exam BP noted to be well controlled today in office.    Assessment:   Blood Pressure well controlled.   Plan:  Current treatment plan is effective, no change in therapy. Tylenol/Ibuprofen for HA's. Keep upcoming PP appt.

## 2021-02-08 ENCOUNTER — Ambulatory Visit (INDEPENDENT_AMBULATORY_CARE_PROVIDER_SITE_OTHER): Payer: Medicaid Other | Admitting: Licensed Clinical Social Worker

## 2021-02-08 DIAGNOSIS — F53 Postpartum depression: Secondary | ICD-10-CM | POA: Diagnosis not present

## 2021-02-08 NOTE — BH Specialist Note (Signed)
Integrated Behavioral Health via Telemedicine Visit  02/08/2021 Rakesha Dalporto [id number]  Number of Integrated Behavioral Health visits: 1 Session Start time: 944am  Session End time: 10:00am Total time: 16 mins via mychart video   Referring Provider: Self  Patient/Family location: Home  [facility] Provider location: [facility]  All persons participating in visit: Pt S Schuld and LCSW A. Tonnette Zwiebel  Types of Service: Individual psychotherapy  I connected with [name] and/or [name] n/a via  Telephone or Video Enabled Telemedicine Application  (Video is Caregility application) and verified that I am speaking with the correct person using two identifiers. Discussed confidentiality: Yes   I discussed the limitations of telemedicine and the availability of in person appointments.  Discussed there is a possibility of technology failure and discussed alternative modes of communication if that failure occurs.  I discussed that engaging in this telemedicine visit, they consent to the provision of behavioral healthcare and the services will be billed under their insurance.  Patient and/or legal guardian expressed understanding and consented to Telemedicine visit: Yes   Presenting Concerns: Patient and/or family reports the following symptoms/concerns: history of depression  Duration of problem: approx 2 years ; Severity of problem: mild  Patient and/or Family's Strengths/Protective Factors: Concrete supports in place (healthy food, safe environments, etc.)  Goals Addressed: Patient will:  Reduce symptoms of: depression and stress   Increase knowledge and/or ability of: coping skills   Demonstrate ability to: Increase healthy adjustment to current life circumstances  Progress towards Goals: Ongoing  Interventions: Interventions utilized:  Supportive Counseling Standardized Assessments completed: n/a  Assessment: Patient currently experiencing postpartum .   Patient may benefit  from integrated behavioral health.  Plan: Follow up with behavioral health clinician on : as needed Behavioral recommendations: counter negative thoughts, prioritize rest, develop support team to prevent burnout Referral(s): Integrated [employer] (In Clinic)  I discussed the assessment and treatment plan with the patient and/or parent/guardian. They were provided an opportunity to ask questions and all were answered. They agreed with the plan and demonstrated an understanding of the instructions.   They were advised to call back or seek an in-person evaluation if the symptoms worsen or if the condition fails to improve as anticipated.  Gwyndolyn Saxon, LCSW

## 2021-02-11 ENCOUNTER — Telehealth (HOSPITAL_COMMUNITY): Payer: Self-pay | Admitting: *Deleted

## 2021-02-11 NOTE — Telephone Encounter (Signed)
Phone voicemail message left to return nurse call.  Duffy Rhody, RN 02-11-2021 at 2:31pm

## 2021-03-03 ENCOUNTER — Telehealth: Payer: Self-pay

## 2021-03-03 NOTE — Telephone Encounter (Signed)
Attempted to contact patient regarding message left with after hours triage. Patient reports having postpartum depression, headache, seeing spots, and swollen feet.  Unable to reach patient. LM for patient to return call to the office.

## 2021-03-03 NOTE — Telephone Encounter (Signed)
Patient returned call stating that she is still having headaches, having double vision and having spots and eyes have been red. She has also reports having chest pain, tingling in her fingers. Patient was advised to go to [facility]U over the weekend for evaluation, but patient states that she did not have childcare. Patient unable to check bp at this time due to not having a cuff. Patient did have postpartum preeclampsia. Patient still reports the same sx.  Patient advised to go to MAU for evaluation.

## 2021-03-04 ENCOUNTER — Encounter (HOSPITAL_COMMUNITY): Payer: Self-pay

## 2021-03-04 ENCOUNTER — Inpatient Hospital Stay (HOSPITAL_COMMUNITY)
Admission: EM | Admit: 2021-03-04 | Discharge: 2021-03-04 | Disposition: A | Discharge status: Home / Self Care | Payer: Medicaid Other | Attending: Family Medicine | Admitting: Family Medicine

## 2021-03-04 ENCOUNTER — Other Ambulatory Visit: Payer: Self-pay

## 2021-03-04 ENCOUNTER — Emergency Department (HOSPITAL_COMMUNITY): Payer: Medicaid Other

## 2021-03-04 DIAGNOSIS — H538 Other visual disturbances: Secondary | ICD-10-CM | POA: Diagnosis not present

## 2021-03-04 DIAGNOSIS — Z7901 Long term (current) use of anticoagulants: Secondary | ICD-10-CM | POA: Diagnosis not present

## 2021-03-04 DIAGNOSIS — O1413 Severe pre-eclampsia, third trimester: Secondary | ICD-10-CM | POA: Diagnosis not present

## 2021-03-04 DIAGNOSIS — R0602 Shortness of breath: Secondary | ICD-10-CM | POA: Insufficient documentation

## 2021-03-04 DIAGNOSIS — R5383 Other fatigue: Secondary | ICD-10-CM | POA: Insufficient documentation

## 2021-03-04 DIAGNOSIS — R519 Headache, unspecified: Secondary | ICD-10-CM | POA: Insufficient documentation

## 2021-03-04 DIAGNOSIS — I10 Essential (primary) hypertension: Secondary | ICD-10-CM | POA: Insufficient documentation

## 2021-03-04 DIAGNOSIS — Z3A00 Weeks of gestation of pregnancy not specified: Secondary | ICD-10-CM | POA: Diagnosis not present

## 2021-03-04 DIAGNOSIS — Z87891 Personal history of nicotine dependence: Secondary | ICD-10-CM | POA: Diagnosis not present

## 2021-03-04 DIAGNOSIS — O9089 Other complications of the puerperium, not elsewhere classified: Secondary | ICD-10-CM | POA: Diagnosis not present

## 2021-03-04 DIAGNOSIS — R350 Frequency of micturition: Secondary | ICD-10-CM | POA: Diagnosis not present

## 2021-03-04 LAB — COMPREHENSIVE METABOLIC PANEL
ALT: 27 U/L (ref 0–44)
AST: 21 U/L (ref 15–41)
Albumin: 4.2 g/dL (ref 3.5–5.0)
Alkaline Phosphatase: 79 U/L (ref 38–126)
Anion gap: 6 (ref 5–15)
BUN: 13 mg/dL (ref 6–20)
CO2: 26 mmol/L (ref 22–32)
Calcium: 8.7 mg/dL — ABNORMAL LOW (ref 8.9–10.3)
Chloride: 104 mmol/L (ref 98–111)
Creatinine, Ser: 0.74 mg/dL (ref 0.44–1.00)
GFR, Estimated: >60 mL/min (ref >60)
Glucose, Bld: 111 mg/dL — ABNORMAL HIGH (ref 70–99)
Potassium: 3.8 mmol/L (ref 3.5–5.1)
Sodium: 136 mmol/L (ref 135–145)
Total Bilirubin: 0.4 mg/dL (ref 0.3–1.2)
Total Protein: 7.9 g/dL (ref 6.5–8.1)

## 2021-03-04 LAB — CBC WITH DIFFERENTIAL/PLATELET
Abs Immature Granulocytes: 0.06 10*3/uL (ref 0.00–0.07)
Basophils Absolute: 0.1 10*3/uL (ref 0.0–0.1)
Basophils Relative: 1 %
Eosinophils Absolute: 0.5 10*3/uL (ref 0.0–0.5)
Eosinophils Relative: 4 %
HCT: 42 % (ref 36.0–46.0)
Hemoglobin: 13.8 g/dL (ref 12.0–15.0)
Immature Granulocytes: 1 %
Lymphocytes Relative: 25 %
Lymphs Abs: 3.1 10*3/uL (ref 0.7–4.0)
MCH: 26.2 pg (ref 26.0–34.0)
MCHC: 32.9 g/dL (ref 30.0–36.0)
MCV: 79.8 fL — ABNORMAL LOW (ref 80.0–100.0)
Monocytes Absolute: 0.9 10*3/uL (ref 0.1–1.0)
Monocytes Relative: 7 %
Neutro Abs: 8.1 10*3/uL — ABNORMAL HIGH (ref 1.7–7.7)
Neutrophils Relative %: 62 %
Platelets: 319 10*3/uL (ref 150–400)
RBC: 5.26 MIL/uL — ABNORMAL HIGH (ref 3.87–5.11)
RDW: 16.3 % — ABNORMAL HIGH (ref 11.5–15.5)
WBC: 12.6 10*3/uL — ABNORMAL HIGH (ref 4.0–10.5)
nRBC: 0 % (ref 0.0–0.2)

## 2021-03-04 LAB — URINALYSIS, ROUTINE W REFLEX MICROSCOPIC
Bilirubin Urine: NEGATIVE
Glucose, UA: NEGATIVE mg/dL
Hgb urine dipstick: NEGATIVE
Ketones, ur: NEGATIVE mg/dL
Leukocytes,Ua: NEGATIVE
Nitrite: NEGATIVE
Protein, ur: NEGATIVE mg/dL
Specific Gravity, Urine: 1.011 (ref 1.005–1.030)
pH: 6 (ref 5.0–8.0)

## 2021-03-04 LAB — PROTEIN / CREATININE RATIO, URINE
Creatinine, Urine: 91.85 mg/dL
Protein Creatinine Ratio: 0.07 mg/mg{Cre} (ref 0.00–0.15)
Total Protein, Urine: 6 mg/dL

## 2021-03-04 LAB — BRAIN NATRIURETIC PEPTIDE: B Natriuretic Peptide: 17.2 pg/mL (ref 0.0–100.0)

## 2021-03-04 LAB — TROPONIN I (HIGH SENSITIVITY): Troponin I (High Sensitivity): 3 ng/L (ref <18)

## 2021-03-04 MED ORDER — OXYCODONE-ACETAMINOPHEN 5-325 MG PO TABS
2.0000 | ORAL_TABLET | Freq: Once | ORAL | Status: AC
  Administered 2021-03-04: 2 via ORAL
  Filled 2021-03-04: qty 2

## 2021-03-04 MED ORDER — LISINOPRIL 10 MG PO TABS
10.0000 mg | ORAL_TABLET | Freq: Every day | ORAL | Status: DC
  Administered 2021-03-04: 10 mg via ORAL
  Filled 2021-03-04: qty 1

## 2021-03-04 MED ORDER — ACETAMINOPHEN 500 MG PO TABS: 1000.0000 mg | ORAL_TABLET | Freq: Once | ORAL | Status: DC

## 2021-03-04 MED ORDER — LISINOPRIL 10 MG PO TABS: 10.0000 mg | ORAL_TABLET | Freq: Every day | ORAL | 0 refills | Status: DC

## 2021-03-04 NOTE — MAU Note (Signed)
Pt presented to MAU, ED transfer from [facility] for postpartum HTN eval.

## 2021-03-04 NOTE — ED Provider Triage Note (Addendum)
Emergency Medicine Provider Triage Evaluation Note  Natalie Chase , a 41 y.o. female  was evaluated in triage.  Pt complains of headaches, chest pain, SOB, LE edema.  She is approx 4 weeks post-partum, induced delivery due to pre-eclampsia.  States since delivery symptoms have persisted, headaches worsening.  Denies dizziness, confusion, numbness, or weakness.  Has follow-up with OB 03/10/21 but told not to wait.  She was supposed to go to MAU but came here instead.  Review of Systems  Positive: Chest pain, headaches, leg swelling, SOB Negative: fever  Physical Exam  BP 117/73 (BP Location: Left Arm)    Pulse 96    Temp 98.3 F (36.8 C) (Oral)    Resp 16    Ht 5' 4.5" (1.638 m)    Wt 94.3 kg    LMP 05/08/2020 (Approximate)    SpO2 93%    BMI 35.15 kg/m   Gen:   Awake, no distress   Resp:  Normal effort  MSK:   Moves extremities without difficulty  Other:  Trace edema at ankles  Medical Decision Making  Medically screening exam initiated at 1:43 AM.  Appropriate orders placed.  Marietta Sikkema was informed that the remainder of the evaluation will be completed by another provider, this initial triage assessment does not replace that evaluation, and the importance of remaining in the ED until their evaluation is complete.  Post-partum headaches, leg swelling, chest pain.  No cardiac history.  Currently vitals are stable.  EKG, labs, CXR ordered.  2:10 AM Spoke with NP Melanie at MAU-- we have discussed work-up ordered, she is in agreement with tests.  If all are reassuring and she remains normotensive with stable exam, likely ok to d/c and have her follow-up in clinic as scheduled.   Garlon Hatchet, PA-C 03/04/21 0217    Garlon Hatchet, PA-C 03/04/21 [phone]

## 2021-03-04 NOTE — MAU Provider Note (Signed)
History     CSN: [id number]  Arrival date and time: 03/04/21 0128   Event Date/Time   First Provider Initiated Contact with Patient 03/04/21 2035      Chief Complaint  Patient presents with   Hypertension   HPI Natalie Chase is a 41 y.o. [id number] patient who is 4 weeks postpartum. She presents to MAU with chief complaints of headache, blurry vision, fatigue and changes in her voiding. She is s/p vaginal delivery on 01/29/2021. Patient is compliant with Procardia 30 XL daily but states she has a headache every day. She has attempted management with 500 mg  Tylenol but has not experienced relief. She is also intermittently experiencing "sparkles" in her field of vision.   Patient endorses urinary frequency, new onset since hospital discharge. She has also noticed "lots of bubbles" when she voids and is concerned.   She denies RUQ/epigastric pain, new onset swelling or weight gain. She denies heavy vaginal bleeding, concerns related to breastfeeding, fever or recent illness.  OB History     Gravida  4   Para  3   Term  3   Preterm      AB  1   Living  3      SAB      IAB  1   Ectopic      Multiple  0   Live Births  3           Past Medical History:  Diagnosis Date   Asthma    Hypertension    Lupus (systemic lupus erythematosus) ([place])    Pre-diabetes 05/2020   Sickle cell trait ([place])    Vaginal Pap smear, abnormal     Past Surgical History:  Procedure Laterality Date   CHOLECYSTECTOMY      Family History  Problem Relation Age of Onset   Achondroplasia Mother    Lupus Mother    Sickle cell anemia Mother    Hypertension Father    Stroke Father    Cancer Other    Heart disease Maternal Grandmother     Social History   Tobacco Use   Smoking status: Former    Packs/day: 0.00    Types: Cigarettes    Quit date: 01/09/2017    Years since quitting: 4.1   Smokeless tobacco: Former  Scientific laboratory technician Use: Never used  Substance Use Topics    Alcohol use: Not Currently   Drug use: Not Currently    Types: Marijuana    Comment: last used last week    Allergies: No Known Allergies  Medications Prior to Admission  Medication Sig Dispense Refill Last Dose   albuterol (VENTOLIN HFA) 108 (90 Base) MCG/ACT inhaler Inhale 1-2 puffs into the lungs every 6 (six) hours as needed for wheezing or shortness of breath. 18 g 1    furosemide (LASIX) 20 MG tablet Take 1 tablet (20 mg total) by mouth 2 (two) times daily. (Patient not taking: Reported on 02/07/2021) 8 tablet 0    ibuprofen (ADVIL) 600 MG tablet Take 1 tablet (600 mg total) by mouth every 6 (six) hours. 30 tablet 0    NIFEdipine (ADALAT CC) 60 MG 24 hr tablet Take 1 tablet (60 mg total) by mouth daily. 30 tablet 5    Prenatal 27-1 MG TABS Take 1 tablet by mouth daily. 30 tablet 4    sertraline (ZOLOFT) 50 MG tablet Take 1 tablet (50 mg total) by mouth daily. 30 tablet 6  Review of Systems  Constitutional:  Positive for fatigue.  Eyes:  Positive for visual disturbance.  Genitourinary:  Positive for frequency.  Neurological:  Positive for headaches.  All other systems reviewed and are negative. Physical Exam   Blood pressure (!) 156/81, pulse 90, temperature 98.5 F (36.9 C), temperature source Oral, resp. rate 20, height 5' 4.5" (1.638 m), weight 94.3 kg, last menstrual period 05/08/2020, SpO2 100 %, unknown if currently breastfeeding.  Physical Exam Vitals and nursing note reviewed. Exam conducted with a chaperone present.  Constitutional:      Appearance: Normal appearance.  Cardiovascular:     Rate and Rhythm: Normal rate and regular rhythm.     Pulses: Normal pulses.     Heart sounds: Normal heart sounds.  Pulmonary:     Effort: Pulmonary effort is normal.     Breath sounds: Normal breath sounds.  Abdominal:     General: Abdomen is flat.     Tenderness: There is no abdominal tenderness. There is no right CVA tenderness or left CVA tenderness.  Neurological:      Mental Status: She is alert and oriented to person, place, and time.  Psychiatric:        Mood and Affect: Mood normal.        Behavior: Behavior normal.        Thought Content: Thought content normal.        Judgment: Judgment normal.    MAU Course  Procedures  --Patient evaluated in MAU Family Room due to presence of patient's newborn and toddler.  --S/p vaginal delivery following IOL for CHTN with Superimposed Preeclampsia (on Procardia XL 60mg  during pregnancy).  --Patient received Magnesium Sulfate during her inpatient course --Normal BP check in office on 02/07/2021 --Discussed with Dr.Pratt, who agrees headaches potentially linked to Procardia. Will discontinue. Initiate Lisinopril 10mg  and HCTZ daily. Patient declines HCTZ.   Results for orders placed or performed during the hospital encounter of 03/04/21 (from the past 24 hour(s))  CBC with Differential     Status: Abnormal   Collection Time: 03/04/21  2:40 AM  Result Value Ref Range   WBC 12.6 (H) 4.0 - 10.5 K/uL   RBC 5.26 (H) 3.87 - 5.11 MIL/uL   Hemoglobin 13.8 12.0 - 15.0 g/dL   HCT 42.0 36.0 - 46.0 %   MCV 79.8 (L) 80.0 - 100.0 fL   MCH 26.2 26.0 - 34.0 pg   MCHC 32.9 30.0 - 36.0 g/dL   RDW 16.3 (H) 11.5 - 15.5 %   Platelets 319 150 - 400 K/uL   nRBC 0.0 0.0 - 0.2 %   Neutrophils Relative % 62 %   Neutro Abs 8.1 (H) 1.7 - 7.7 K/uL   Lymphocytes Relative 25 %   Lymphs Abs 3.1 0.7 - 4.0 K/uL   Monocytes Relative 7 %   Monocytes Absolute 0.9 0.1 - 1.0 K/uL   Eosinophils Relative 4 %   Eosinophils Absolute 0.5 0.0 - 0.5 K/uL   Basophils Relative 1 %   Basophils Absolute 0.1 0.0 - 0.1 K/uL   Immature Granulocytes 1 %   Abs Immature Granulocytes 0.06 0.00 - 0.07 K/uL  Troponin I (High Sensitivity)     Status: None   Collection Time: 03/04/21  2:40 AM  Result Value Ref Range   Troponin I (High Sensitivity) 3 <18 ng/L  Brain natriuretic peptide     Status: None   Collection Time: 03/04/21  2:40 AM  Result  Value Ref Range   B  Natriuretic Peptide 17.2 0.0 - 100.0 pg/mL  Comprehensive metabolic panel     Status: Abnormal   Collection Time: 03/04/21  2:40 AM  Result Value Ref Range   Sodium 136 135 - 145 mmol/L   Potassium 3.8 3.5 - 5.1 mmol/L   Chloride 104 98 - 111 mmol/L   CO2 26 22 - 32 mmol/L   Glucose, Bld 111 (H) 70 - 99 mg/dL   BUN 13 6 - 20 mg/dL   Creatinine, Ser 0.74 0.44 - 1.00 mg/dL   Calcium 8.7 (L) 8.9 - 10.3 mg/dL   Total Protein 7.9 6.5 - 8.1 g/dL   Albumin 4.2 3.5 - 5.0 g/dL   AST 21 15 - 41 U/L   ALT 27 0 - 44 U/L   Alkaline Phosphatase 79 38 - 126 U/L   Total Bilirubin 0.4 0.3 - 1.2 mg/dL   GFR, Estimated >60 >60 mL/min   Anion gap 6 5 - 15  Urinalysis, Routine w reflex microscopic Urine, Clean Catch     Status: Abnormal   Collection Time: 03/04/21  2:40 AM  Result Value Ref Range   Color, Urine STRAW (A) YELLOW   APPearance CLEAR CLEAR   Specific Gravity, Urine 1.011 1.005 - 1.030   pH 6.0 5.0 - 8.0   Glucose, UA NEGATIVE NEGATIVE mg/dL   Hgb urine dipstick NEGATIVE NEGATIVE   Bilirubin Urine NEGATIVE NEGATIVE   Ketones, ur NEGATIVE NEGATIVE mg/dL   Protein, ur NEGATIVE NEGATIVE mg/dL   Nitrite NEGATIVE NEGATIVE   Leukocytes,Ua NEGATIVE NEGATIVE  Protein / creatinine ratio, urine     Status: None   Collection Time: 03/04/21  2:40 AM  Result Value Ref Range   Creatinine, Urine 91.85 mg/dL   Total Protein, Urine 6 mg/dL   Protein Creatinine Ratio 0.07 0.00 - 0.15 mg/mg[Cre]    Meds ordered this encounter  Medications   DISCONTD: acetaminophen (TYLENOL) tablet 1,000 mg   oxyCODONE-acetaminophen (PERCOCET/ROXICET) 5-325 MG per tablet 2 tablet   lisinopril (ZESTRIL) tablet 10 mg   lisinopril (ZESTRIL) 10 MG tablet    Sig: Take 1 tablet (10 mg total) by mouth daily.    Dispense:  30 tablet    Refill:  0    Order Specific Question:   Supervising Provider    Answer:   Donnamae Jude [id number]   Assessment and Plan  --41 y.o. [phone] 4 weeks  postpartum --Hx CHTN with SIPE --S/p Magnesium Sulfate during inpatient course --D/C Procardia XL 30 mg --Initiate Lisinopril 10 mg, first dose given in MAU --Patient declines HCTZ --Due to presence of small children, patient requests discharge home as soon as possible --Patient discharged home in stable condition  F/U: Patient has postpartum appointment [facility] on 03/10/2021  Darlina Rumpf, CNM

## 2021-03-04 NOTE — ED Triage Notes (Signed)
Pt reports she is 4 weeks postpartum. Pt reports headache, chest pains, leg swelling and dark areas under her eyes.

## 2021-03-10 ENCOUNTER — Ambulatory Visit: Payer: Medicaid Other | Admitting: Obstetrics and Gynecology

## 2021-04-11 ENCOUNTER — Encounter: Payer: Self-pay | Admitting: Obstetrics and Gynecology

## 2021-04-11 ENCOUNTER — Other Ambulatory Visit (HOSPITAL_COMMUNITY)
Admission: RE | Admit: 2021-04-11 | Discharge: 2021-04-11 | Disposition: A | Discharge status: Home / Self Care | Payer: Medicaid Other | Source: Ambulatory Visit | Attending: Obstetrics and Gynecology | Admitting: Obstetrics and Gynecology

## 2021-04-11 ENCOUNTER — Ambulatory Visit (INDEPENDENT_AMBULATORY_CARE_PROVIDER_SITE_OTHER): Payer: Medicaid Other | Admitting: Obstetrics and Gynecology

## 2021-04-11 ENCOUNTER — Other Ambulatory Visit: Payer: Self-pay

## 2021-04-11 ENCOUNTER — Other Ambulatory Visit (HOSPITAL_COMMUNITY): Payer: Self-pay

## 2021-04-11 VITALS — BP 144/83 | HR 85 | Ht 64.0 in | Wt 206.0 lb

## 2021-04-11 DIAGNOSIS — I10 Essential (primary) hypertension: Secondary | ICD-10-CM

## 2021-04-11 DIAGNOSIS — N898 Other specified noninflammatory disorders of vagina: Secondary | ICD-10-CM

## 2021-04-11 MED ORDER — LISINOPRIL 10 MG PO TABS: 10.0000 mg | ORAL_TABLET | Freq: Every day | ORAL | 0 refills | Status: DC

## 2021-04-11 NOTE — Progress Notes (Signed)
Post Partum Visit Note  Natalie Chase is a 41 y.o. [phone] female who presents for a postpartum visit. She is 9 weeks postpartum following a normal spontaneous vaginal delivery.  I have fully reviewed the prenatal and intrapartum course. The delivery was at 38. 6 gestational weeks.  Anesthesia: epidural. Postpartum course has been unremarkable. Baby is doing well. Baby is feeding by breast. Bleeding no bleeding. Bowel function is normal. Bladder function is normal. Patient is not sexually active. Contraception method is abstinence. Postpartum depression screening: negative.   The pregnancy intention screening data noted above was reviewed. Potential methods of contraception were discussed. The patient elected to proceed with No data recorded.   Edinburgh Postnatal Depression Scale - 04/11/21 1536       Edinburgh Postnatal Depression Scale:  In the Past 7 Days   I have been able to laugh and see the funny side of things. 0    I have looked forward with enjoyment to things. 0    I have blamed myself unnecessarily when things went wrong. 0    I have been anxious or worried for no good reason. 0    I have felt scared or panicky for no good reason. 0    Things have been getting on top of me. 0    I have been so unhappy that I have had difficulty sleeping. 0    I have felt sad or miserable. 0    I have been so unhappy that I have been crying. 0    The thought of harming myself has occurred to me. 0    Edinburgh Postnatal Depression Scale Total 0             Health Maintenance Due  Topic Date Due   COVID-19 Vaccine (1) Never done    Medical record  Review of Systems Pertinent items are noted in HPI.  Objective:  BP (!) 164/96    Pulse 91    Ht 5\' 4"  (1.626 m)    Wt 206 lb (93.4 kg)    LMP 05/08/2020 (Approximate)    Breastfeeding Yes    BMI 35.36 kg/m    General:  alert   Breasts:  not indicated  Lungs: clear to auscultation bilaterally  Heart:  regular rate and rhythm,  S1, S2 normal, no murmur, click, rub or gallop  Abdomen: soft, non-tender; bowel sounds normal; no masses,  no organomegaly   Wound NA  GU exam:  not indicated       Assessment:    There are no diagnoses linked to this encounter.  Nl postpartum exam.  CHTN Vaginal odor  Plan:   Essential components of care per ACOG recommendations:  1.  Mood and well being: Patient with negative depression screening today. Reviewed local resources for support.  - Patient tobacco use? No.   - hx of drug use? No.    2. Infant care and feeding:  -Patient currently breastmilk feeding? Yes -Social determinants of health (SDOH) reviewed in EPIC. No concerns  3. Sexuality, contraception and birth spacing - Patient does not know want a pregnancy in the next year.  Desired family size is completed children.  - Reviewed forms of contraception in tiered fashion. Patient desired abstinence today.   - Discussed birth spacing of 18 months  4. Sleep and fatigue -Encouraged family/partner/community support of 4 hrs of uninterrupted sleep to help with mood and fatigue  5. Physical Recovery  - Discussed patients delivery and complications. She describes  her labor as good. - Patient had a Vaginal, no problems at delivery. Patient had a 1st degree laceration. Perineal healing reviewed. Patient expressed understanding - Patient has urinary incontinence? No. - Patient is safe to resume physical and sexual activity  6.  Health Maintenance - HM due items addressed Yes - Last pap smear  Diagnosis  Date Value Ref Range Status  07/20/2020   Final   - Negative for Intraepithelial Lesions or Malignancy (NILM)  07/20/2020 - Benign reactive/reparative changes  Final   Pap smear not done at today's visit.  -Breast Cancer screening indicated? No.   7. Chronic Disease/Pregnancy Condition follow up: Hypertension  Continue with Lisinopril. F/U with PCP.  Self sawb today Return to work note for 04/18/21  Arlina Robes, MD [facility] [place] [place]

## 2021-04-11 NOTE — Patient Instructions (Signed)

## 2021-04-12 LAB — CERVICOVAGINAL ANCILLARY ONLY
Bacterial Vaginitis (gardnerella): NEGATIVE
Candida Glabrata: NEGATIVE
Candida Vaginitis: NEGATIVE
Chlamydia: NEGATIVE
Comment: NEGATIVE
Comment: NEGATIVE
Comment: NEGATIVE
Comment: NEGATIVE
Comment: NEGATIVE
Comment: NORMAL
Neisseria Gonorrhea: NEGATIVE
Trichomonas: NEGATIVE

## 2021-05-07 ENCOUNTER — Other Ambulatory Visit: Payer: Self-pay | Admitting: Obstetrics and Gynecology

## 2021-05-07 DIAGNOSIS — I10 Essential (primary) hypertension: Secondary | ICD-10-CM

## 2021-05-13 ENCOUNTER — Other Ambulatory Visit (HOSPITAL_COMMUNITY): Payer: Self-pay

## 2021-05-13 ENCOUNTER — Other Ambulatory Visit: Payer: Self-pay | Admitting: Obstetrics and Gynecology

## 2021-05-16 ENCOUNTER — Encounter (HOSPITAL_COMMUNITY): Payer: Self-pay | Admitting: *Deleted

## 2021-05-16 ENCOUNTER — Other Ambulatory Visit: Payer: Self-pay

## 2021-05-16 ENCOUNTER — Other Ambulatory Visit: Payer: Self-pay | Admitting: Obstetrics and Gynecology

## 2021-05-16 ENCOUNTER — Other Ambulatory Visit (HOSPITAL_COMMUNITY): Payer: Self-pay

## 2021-05-16 ENCOUNTER — Ambulatory Visit (HOSPITAL_COMMUNITY)
Admission: EM | Admit: 2021-05-16 | Discharge: 2021-05-16 | Disposition: A | Discharge status: Home / Self Care | Payer: Medicaid Other | Attending: Physician Assistant | Admitting: Physician Assistant

## 2021-05-16 DIAGNOSIS — J4521 Mild intermittent asthma with (acute) exacerbation: Secondary | ICD-10-CM | POA: Diagnosis not present

## 2021-05-16 DIAGNOSIS — Z20822 Contact with and (suspected) exposure to covid-19: Secondary | ICD-10-CM | POA: Insufficient documentation

## 2021-05-16 DIAGNOSIS — R051 Acute cough: Secondary | ICD-10-CM | POA: Diagnosis not present

## 2021-05-16 DIAGNOSIS — J029 Acute pharyngitis, unspecified: Secondary | ICD-10-CM | POA: Insufficient documentation

## 2021-05-16 DIAGNOSIS — J069 Acute upper respiratory infection, unspecified: Secondary | ICD-10-CM | POA: Diagnosis not present

## 2021-05-16 DIAGNOSIS — M329 Systemic lupus erythematosus, unspecified: Secondary | ICD-10-CM | POA: Diagnosis not present

## 2021-05-16 DIAGNOSIS — Z79899 Other long term (current) drug therapy: Secondary | ICD-10-CM | POA: Insufficient documentation

## 2021-05-16 DIAGNOSIS — R509 Fever, unspecified: Secondary | ICD-10-CM | POA: Diagnosis present

## 2021-05-16 LAB — POC INFLUENZA A AND B ANTIGEN (URGENT CARE ONLY)
INFLUENZA A ANTIGEN, POC: NEGATIVE
INFLUENZA B ANTIGEN, POC: NEGATIVE

## 2021-05-16 LAB — POCT RAPID STREP A, ED / UC: Streptococcus, Group A Screen (Direct): NEGATIVE

## 2021-05-16 MED ORDER — PREDNISONE 10 MG PO TABS
20.0000 mg | ORAL_TABLET | Freq: Every day | ORAL | 0 refills | Status: AC
  Filled 2021-05-16: qty 8, 4d supply, fill #0

## 2021-05-16 NOTE — Discharge Instructions (Signed)
I believe that you have a viral illness that is flaring your asthma.  Continue using your albuterol as prescribed.  I have sent in a low-dose of prednisone (20 mg for 4 days).  You should not take NSAIDs including aspirin, ibuprofen/Advil, naproxen/Aleve with this medication as it can cause stomach bleeding.  You can use Tylenol as needed.  This can be passed through breastmilk so please try to avoid breast-feeding 4 hours after taking your dose.  We will contact you if your COVID test is positive.  Make sure you rest and drink plenty of fluid.  If anything worsens and you develop chest pain, shortness of breath, high fever, nausea/vomiting, chest tightness that is not responding to your albuterol, weakness you need to go to the emergency room.  If symptoms or not improved within a week return here or see your PCP. ?

## 2021-05-16 NOTE — ED Provider Notes (Signed)
?[facility] ? ? ? ?CSN: [id number] ?Arrival date & time: 05/16/21  0801 ? ? ?  ? ?History   ?Chief Complaint ?Chief Complaint  ?Patient presents with  ? Fever  ? Sore Throat  ? Headache  ? Chills  ? ? ?HPI ?Natalie Chase is a 41 y.o. female.  ? ?Patient presents today with a 2-day history of URI symptoms.  Reports fever with highest recorded 102 ?F, sore throat, chills, body aches, headache, postnasal drainage, cough.  Denies any shortness of breath or significant chest pain.  Denies any nausea, vomiting, diarrhea.  Denies any known sick contacts.  She has tried ibuprofen, Hall's cough drops, over-the-counter medication without improvement of symptoms.  She is currently breast-feeding but is confident that she is not pregnant.  Denies any recent steroid or antibiotic use.  She does have a history of asthma and has been using albuterol inhaler more frequently since symptom onset.  She does not smoke.  She has not had COVID.  She has had a COVID-vaccine but has not had flu shot.  Denies history of diabetes.  She does have a history of lupus and is currently managed with Plaquenil and another medication. ? ? ?Past Medical History:  ?Diagnosis Date  ? Asthma   ? Hypertension   ? Lupus (systemic lupus erythematosus) ([facility])   ? Pre-diabetes 05/2020  ? Sickle cell trait ([facility])   ? Vaginal Pap smear, abnormal   ? ? ?Patient Active Problem List  ? Diagnosis Date Noted  ? Postpartum care following vaginal delivery 04/11/2021  ? Vaginal odor 04/11/2021  ? Lupus (systemic lupus erythematosus) ([facility])   ? Sickle cell trait in mother affecting pregnancy ([facility]) 11/14/2018  ? Hypertension 06/11/2017  ? ? ?Past Surgical History:  ?Procedure Laterality Date  ? CHOLECYSTECTOMY    ? ? ?OB History   ? ? Gravida  ?4  ? Para  ?3  ? Term  ?3  ? Preterm  ?   ? AB  ?1  ? Living  ?3  ?  ? ? SAB  ?   ? IAB  ?1  ? Ectopic  ?   ? Multiple  ?0  ? Live Births  ?3  ?   ?  ?  ? ? ? ?Home Medications   ? ?Prior to Admission medications    ?Medication Sig Start Date End Date Taking? Authorizing Provider  ?predniSONE (DELTASONE) 10 MG tablet Take 2 tablets (20 mg total) by mouth daily for 4 days 05/16/21  Yes Keondria Siever K, PA-C  ?lisinopril (ZESTRIL) 10 MG tablet TAKE 1 TABLET(10 MG) BY MOUTH DAILY 05/13/21   Rosslyn Farms Bing, MD  ?Prenatal 27-1 MG TABS Take 1 tablet by mouth daily. 01/07/21   Warden Fillers, MD  ?sertraline (ZOLOFT) 50 MG tablet Take 1 tablet (50 mg total) by mouth daily. 07/20/20   Constant, Gigi Gin, MD  ? ? ?Family History ?Family History  ?Problem Relation Age of Onset  ? Achondroplasia Mother   ? Lupus Mother   ? Sickle cell anemia Mother   ? Hypertension Father   ? Stroke Father   ? Cancer Other   ? Heart disease Maternal Grandmother   ? ? ?Social History ?Social History  ? ?Tobacco Use  ? Smoking status: Former  ?  Packs/day: 0.00  ?  Types: Cigarettes  ?  Quit date: 01/09/2017  ?  Years since quitting: 4.3  ? Smokeless tobacco: Former  ?Vaping Use  ? Vaping Use: Never used  ?  Substance Use Topics  ? Alcohol use: Not Currently  ? Drug use: Not Currently  ?  Types: Marijuana  ?  Comment: last used last week  ? ? ? ?Allergies   ?Patient has no known allergies. ? ? ?Review of Systems ?Review of Systems  ?Constitutional:  Positive for activity change, chills, fatigue and fever. Negative for appetite change.  ?HENT:  Positive for congestion, postnasal drip and sore throat. Negative for sinus pressure and sneezing.   ?Respiratory:  Positive for cough. Negative for shortness of breath.   ?Cardiovascular:  Negative for chest pain.  ?Gastrointestinal:  Negative for abdominal pain, diarrhea, nausea and vomiting.  ?Musculoskeletal:  Positive for arthralgias and myalgias.  ?Neurological:  Positive for headaches. Negative for dizziness and light-headedness.  ? ? ?Physical Exam ?Triage Vital Signs ?ED Triage Vitals  ?Enc Vitals Group  ?   BP 05/16/21 0814 140/79  ?   Pulse Rate 05/16/21 0814 93  ?   Resp 05/16/21 0814 18  ?   Temp 05/16/21  0814 99.8 ?F (37.7 ?C)  ?   Temp src --   ?   SpO2 05/16/21 0814 96 %  ?   Weight --   ?   Height --   ?   Head Circumference --   ?   Peak Flow --   ?   Pain Score 05/16/21 0811 6  ?   Pain Loc --   ?   Pain Edu? --   ?   Excl. in GC? --   ? ?No data found. ? ?Updated Vital Signs ?BP 140/79   Pulse 93   Temp 99.8 ?F (37.7 ?C)   Resp 18   LMP 04/19/2021   SpO2 96%  ? ?Visual Acuity ?Right Eye Distance:   ?Left Eye Distance:   ?Bilateral Distance:   ? ?Right Eye Near:   ?Left Eye Near:    ?Bilateral Near:    ? ?Physical Exam ?Vitals reviewed.  ?Constitutional:   ?   General: She is awake. She is not in acute distress. ?   Appearance: Normal appearance. She is well-developed. She is not ill-appearing.  ?   Comments: Very pleasant female appears stated age in no acute distress sitting comfortably in exam room  ?HENT:  ?   Head: Normocephalic and atraumatic.  ?   Right Ear: Ear canal and external ear normal. A middle ear effusion is present. Tympanic membrane is not erythematous or bulging.  ?   Left Ear: Ear canal and external ear normal. A middle ear effusion is present. Tympanic membrane is not erythematous or bulging.  ?   Nose:  ?   Right Sinus: No maxillary sinus tenderness or frontal sinus tenderness.  ?   Left Sinus: No maxillary sinus tenderness or frontal sinus tenderness.  ?   Mouth/Throat:  ?   Pharynx: Uvula midline. Posterior oropharyngeal erythema present. No oropharyngeal exudate.  ?Cardiovascular:  ?   Rate and Rhythm: Normal rate and regular rhythm.  ?   Heart sounds: Normal heart sounds, S1 normal and S2 normal. No murmur heard. ?Pulmonary:  ?   Effort: Pulmonary effort is normal.  ?   Breath sounds: Normal breath sounds. No wheezing, rhonchi or rales.  ?   Comments: Clear to auscultation bilaterally ?Psychiatric:     ?   Behavior: Behavior is cooperative.  ? ? ? ?UC Treatments / Results  ?Labs ?(all labs ordered are listed, but only abnormal results are displayed) ?Labs Reviewed  ?SARS  CORONAVIRUS 2 (TAT 6-24 HRS)  ?POC INFLUENZA A AND B ANTIGEN (URGENT CARE ONLY)  ?POCT RAPID STREP A, ED / UC  ? ? ?EKG ? ? ?Radiology ?No results found. ? ?Procedures ?Procedures (including critical care time) ? ?Medications Ordered in UC ?Medications - No data to display ? ?Initial Impression / Assessment and Plan / UC Course  ?I have reviewed the triage vital signs and the nursing notes. ? ?Pertinent labs & imaging results that were available during my care of the patient were reviewed by me and considered in my medical decision making (see chart for details). ? ?  ? ?Patient is well-appearing, afebrile, nontachycardic, nontoxic.  Discussed likely viral etiology of symptoms.  Flu testing was negative in clinic.  Strep testing was also negative.  COVID testing was obtained and is pending.  She was provided work excuse note with current CDC return to work guidelines based on COVID test result.  I suspect that asthma has been exacerbated by illness given increased need for albuterol with inadequate control of symptoms.  She is currently breast-feeding and we discussed risks and benefits of prednisone in the setting.  After shared decision making patient is interested in a low-dose of prednisone for several days we discussed that she should avoid breast-feeding for 4 hours after ingestion of dose.  20 mg x 4 days sent to pharmacy.  She reports having enough of albuterol at home but will contact us if refill is required.  She can use over-the-counter medications including Tylenol and Flonase for additional symptom relief.  She is to rest and drink plenty of fluid.  Discussed that if anything worsen she is to return for reevaluation.  Strict return precautions given to which she expressed understanding. ? ?Final Clinical Impressions(s) / UC Diagnoses  ? ?Final diagnoses:  ?Upper respiratory tract infection, unspecified type  ?Sore throat  ?Fever, unspecified  ?Acute cough  ?Mild intermittent asthma with acute  exacerbation  ? ? ? ?Discharge Instructions   ? ?  ?I believe that you have a viral illness that is flaring your asthma.  Continue using your albuterol as prescribed.  I have sent in a low-dose of prednisone (20 mg for 4 days).  You s

## 2021-05-16 NOTE — ED Triage Notes (Signed)
Pt reports HA,fever,sore throat and chills for 2 days. ?

## 2021-05-17 ENCOUNTER — Other Ambulatory Visit (HOSPITAL_COMMUNITY): Payer: Self-pay

## 2021-05-17 ENCOUNTER — Ambulatory Visit (INDEPENDENT_AMBULATORY_CARE_PROVIDER_SITE_OTHER): Payer: Medicaid Other | Admitting: Licensed Clinical Social Worker

## 2021-05-17 DIAGNOSIS — F53 Postpartum depression: Secondary | ICD-10-CM | POA: Diagnosis not present

## 2021-05-17 LAB — SARS CORONAVIRUS 2 (TAT 6-24 HRS): SARS Coronavirus 2: NEGATIVE

## 2021-05-18 NOTE — BH Specialist Note (Signed)
Integrated Behavioral Health Follow Up In-Person Visit ? ?MRN: [id number] ?Name: Natalie Chase ? ?Number of Integrated Behavioral Health Clinician visits: 1 ?Session Start time:  11:00am ?Session End time: 11:47am ?Total time in minutes: 47 mins via mychart video  ? ?Types of Service: Individual psychotherapy and Video visit ? ?Interpretor:No. Interpretor Name and Language: None  ? ?Subjective: ?Natalie Chase is a 41 y.o. female accompanied by n/a ?Patient was referred by self for depression. ?Patient reports the following symptoms/concerns: depressed mood, lack of social support, feeling overwhelmed and on edge, difficulty falling and staying asleep ?Duration of problem: over one year ; Severity of problem: mild ? ?Objective: ?Mood: Depressed and Affect: Depressed ?Risk of harm to self or others: No plan to harm self or others ? ?Life Context: ?Family and Social: Lives with children in [place]  ?School/Work: ABA Therapy  ?Self-Care: n/a ?Life Changes: Considering moving to [place] ? ?Patient and/or Family's Strengths/Protective Factors: ?Concrete supports in place (healthy food, safe environments, etc.) ? ?Goals Addressed: ?Patient will: ? Reduce symptoms of: depression and stress ? Increase knowledge and/or ability of: coping skills and stress reduction  ? Demonstrate ability to: Increase healthy adjustment to current life circumstances ? ?Progress towards Goals: ?Ongoing ? ?Interventions: ?Interventions utilized:  Copywriter, advertising and Supportive Counseling ?Standardized Assessments completed: n/a ? ?Patient and/or Family Response: Natalie Chase responded well to mychart visit.  ? ?Assessment: ?Patient currently experiencing postpartum depression.  ? ?Patient may benefit from integrated behavioral health. ? ?Plan: ?Follow up with behavioral health clinician on : 05/31/2021 ?Behavioral recommendations: Intentionally schedule self care, mindfulness and relaxation techniques, prioritize rest and  incorporate positive affirmations into daily routine ?Referral(s): Integrated [employer] (In Clinic) ? ?Gwyndolyn Saxon, LCSW ? ? ?

## 2021-05-20 ENCOUNTER — Other Ambulatory Visit (HOSPITAL_COMMUNITY): Payer: Self-pay

## 2021-05-20 ENCOUNTER — Other Ambulatory Visit: Payer: Self-pay | Admitting: Obstetrics and Gynecology

## 2021-05-23 ENCOUNTER — Other Ambulatory Visit (HOSPITAL_COMMUNITY): Payer: Self-pay

## 2021-05-24 ENCOUNTER — Other Ambulatory Visit (HOSPITAL_COMMUNITY): Payer: Self-pay

## 2021-05-31 ENCOUNTER — Ambulatory Visit (INDEPENDENT_AMBULATORY_CARE_PROVIDER_SITE_OTHER): Payer: Medicaid Other | Admitting: Licensed Clinical Social Worker

## 2021-05-31 DIAGNOSIS — F53 Postpartum depression: Secondary | ICD-10-CM | POA: Diagnosis not present

## 2021-05-31 NOTE — BH Specialist Note (Signed)
Integrated Behavioral Health via Telemedicine Visit ? ?05/31/2021 ?Natalie Chase ?[id number] ? ?Number of Integrated Behavioral Health Clinician visits: Additional Visit ? ?Session Start time: 1310 ?  ?Session End time: 1341 ? ?Total time in minutes: 31 ?Over the phone per Natalie request  ? ?Referring Provider: n/a ?Patient/Family location: Home  ?[facility] Provider location: [facility]  ?All persons participating in visit: Natalie Chase and LCSW A. Felton Clinton  ?Types of Service: Individual psychotherapy and Telephone visit ? ?I connected with Natalie Chase and/or Natalie Chase n/a via  Telephone or Video Enabled Telemedicine Application  (Video is Care[employer] application) and verified that I am speaking with the correct person using two identifiers. Discussed confidentiality: No  ? ?I discussed the limitations of telemedicine and the availability of in person appointments.  Discussed there is a possibility of technology failure and discussed alternative modes of communication if that failure occurs. ? ?I discussed that engaging in this telemedicine visit, they consent to the provision of behavioral healthcare and the services will be billed under their insurance. ? ?Patient and/or legal guardian expressed understanding and consented to Telemedicine visit: Yes  ? ?Presenting Concerns: ?Patient and/or family reports the following symptoms/concerns: postpartum depression ?Duration of problem: over one year ; Severity of problem: mild ? ?Patient and/or Family's Strengths/Protective Factors: ?Concrete supports in place (healthy food, safe environments, etc.) ? ?Goals Addressed: ?Patient will: ? Reduce symptoms of: depression  ? Increase knowledge and/or ability of: coping skills  ? Demonstrate ability to: Increase healthy adjustment to current life circumstances ? ?Progress towards Goals: ?Ongoing ? ?Interventions: ?Interventions utilized:  Supportive Counseling ?Standardized Assessments completed: Edinburgh Postnatal  Depression ? ?Patient and/or Family Response: Natalie Chase responded well to mychart visit  ? ?Assessment: ?Patient currently experiencing postpartum depression.  ? ?Patient may benefit from outpatient behavioral health. ? ?Plan: ?Follow up with behavioral health clinician on : prn Intentionally schedule self care, mindfulness and relaxation techniques, prioritize rest and incorporate positive affirmations into daily routine ?Behavioral recommendations:  ?Referral(s): Paramedic (LME/Outside Clinic) ? ?I discussed the assessment and treatment plan with the patient and/or parent/guardian. They were provided an opportunity to ask questions and all were answered. They agreed with the plan and demonstrated an understanding of the instructions. ?  ?They were advised to call back or seek an in-person evaluation if the symptoms worsen or if the condition fails to improve as anticipated. ? ?Gwyndolyn Saxon, LCSW ?

## 2021-06-12 ENCOUNTER — Other Ambulatory Visit: Payer: Self-pay | Admitting: Obstetrics and Gynecology

## 2021-06-12 DIAGNOSIS — I10 Essential (primary) hypertension: Secondary | ICD-10-CM
